# Patient Record
Sex: Male | Born: 1955 | Race: White | Hispanic: No | Marital: Married | Smoking: Never smoker
Health system: Southern US, Community
[De-identification: ages and names within clinical notes are randomized; demographics above are authoritative.]

## PROBLEM LIST (undated history)

## (undated) DIAGNOSIS — E119 Type 2 diabetes mellitus without complications: Secondary | ICD-10-CM

## (undated) DIAGNOSIS — I2510 Atherosclerotic heart disease of native coronary artery without angina pectoris: Secondary | ICD-10-CM

## (undated) DIAGNOSIS — I10 Essential (primary) hypertension: Secondary | ICD-10-CM

## (undated) HISTORY — PX: CARDIAC CATHETERIZATION: SHX172

## (undated) HISTORY — PX: EYE SURGERY: SHX253

---

## 2003-02-08 ENCOUNTER — Encounter: Payer: Self-pay | Admitting: Emergency Medicine

## 2003-02-08 ENCOUNTER — Emergency Department (HOSPITAL_COMMUNITY): Admission: EM | Admit: 2003-02-08 | Discharge: 2003-02-08 | Payer: Self-pay | Admitting: Emergency Medicine

## 2003-04-11 ENCOUNTER — Ambulatory Visit (HOSPITAL_COMMUNITY): Admission: RE | Admit: 2003-04-11 | Discharge: 2003-04-12 | Payer: Self-pay | Admitting: Cardiology

## 2003-04-11 HISTORY — PX: PERCUTANEOUS CORONARY STENT INTERVENTION (PCI-S): SHX6016

## 2004-07-24 ENCOUNTER — Emergency Department (HOSPITAL_COMMUNITY): Admission: EM | Admit: 2004-07-24 | Discharge: 2004-07-24 | Payer: Self-pay | Admitting: Emergency Medicine

## 2004-07-25 ENCOUNTER — Emergency Department (HOSPITAL_COMMUNITY): Admission: EM | Admit: 2004-07-25 | Discharge: 2004-07-25 | Payer: Self-pay | Admitting: Family Medicine

## 2007-08-12 ENCOUNTER — Ambulatory Visit (HOSPITAL_BASED_OUTPATIENT_CLINIC_OR_DEPARTMENT_OTHER): Admission: RE | Admit: 2007-08-12 | Discharge: 2007-08-12 | Payer: Self-pay | Admitting: Orthopedic Surgery

## 2011-05-13 NOTE — Op Note (Signed)
[name], [name]            ACCOUNT NO.:  [id number]   MEDICAL RECORD NO.:  [id number]          PATIENT TYPE:  AMB   LOCATION:  DSC                          FACILITY:  [facility]   PHYSICIAN:  Katy Fitch. Sypher, M.D. DATE OF BIRTH:  26-Nov-1956   DATE OF PROCEDURE:  08/12/2007  DATE OF DISCHARGE:                               OPERATIVE REPORT   PREOPERATIVE DIAGNOSIS:  Chronic pain, left shoulder with evidence of  acromioclavicular arthropathy, biceps tendinopathy and stage II  impingement.   POSTOPERATIVE DIAGNOSIS:  Chronic pain, left shoulder with evidence of  acromioclavicular arthropathy, biceps tendinopathy and stage II  impingement, identification of a degenerative SLAP lesion and a  significant acromioclavicular arthropathy and chronic stage II  impingement due to unfavorable acromial and acromioclavicular anatomy.   OPERATION:  1. Examination of left shoulder under anesthesia.  2. Diagnostic arthroscopy left glenohumeral joint with arthroscopic      debridement of labrum, biceps tendon and careful examination of      superior labrum, revealing a stable biceps origin.  3. Arthroscopic subacromial decompression with bursectomy,      coracoacromial ligament relaxation and acromioplasty.  4. Arthroscopic and open resection of distal clavicle.   OPERATING SURGEON:  Josephine Igo, M.D.   ASSISTANT:  Molly Maduro Dasnoit PA-C.   ANESTHESIA:  General endotracheal supplemented by left interscalene  block, supervising anesthesiologist Dr. Isidor Holts.   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  He works primarily  in Pharmacist, hospital.  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  He remains quite active.  He is a  very large muscular and mildly obese gentleman.   Clinical examination of  his shoulder reveals that he had evidence of AC  arthropathy and impingement.  It was noted during his preoperative MRI  that he had some evidence of bicipital tendinopathy, some rotator cuff  tendinopathy and unfavorable acromioclavicular and acromial anatomy.  Due to failure to respond to nonoperative measures for the past several  months, he is brought to the operating at this time anticipating  diagnostic arthroscopy of the shoulder, management of biceps as  necessary, subacromial decompression and distal clavicle resection.   During his preoperative evaluation, he was noted to have hyperglycemia.  He has had symptoms of nocturia.  We have informed Dr. Recardo Evangelist office  both by phone and faxed message that type 2 diabetes has been identified  with Jose Myers.   After informed consent he is brought to the operating room at this time.   PROCEDURE:  Jose Myers is brought to the operating room and placed  in supine position on the operating table.   Following the induction of general anesthesia under Dr. Thornton Dales  strict supervision, he was carefully positioned in the beach-chair  position with aid of a torso and head holder designed for shoulder  arthroscopy.  Left upper extremity forequarter prepped with DuraPrep and  draped with impervious arthroscopy drapes.   Examination the shoulder  under anesthesia revealed mild adhesive  capsulitis with limitation of elevation and 165 degrees in forward  flexion and combined abduction.  He had very mild limitation of external  rotation at 85 degrees, had 90 degrees abduction with internal rotation  of 75 degrees.   There was no sign of glenohumeral instability.   The arthroscope was introduced through a standard posterior portal with  distension the shoulder joint with 20 mL of sterile saline brought in  through an anterior spinal needle approach.  Diagnostic arthroscopy  revealed mild adhesive capsulitis granulation tissue.   The long head  biceps had a frayed the origin with a type 1 SLAP lesion with fragments  hanging within the joint.  After documentation of the degenerative  labrum a 4.5-mm suction shaver was brought in anteriorly to debride the  labrum and biceps.  The adhesive capsulitis tissues were likewise  debrided.   A nerve hook was used to palpate the origin of the biceps.  It was noted  to be quite stable to superior labrum.  The rotator interval was  inspected.  The biceps tendon was noted to be intact through the  intertubercular groove.  The subscapularis was normal in appearance.  The deep surface supraspinatus, infraspinatus, teres minor noted to be  normal.  The inferior recess was normal.   After completion of the intra-articular debridement, the scope was  removed from the glenohumeral joint and placed in subacromial space.  Adhesive bursitis was noted.  After extensive bursectomy the anatomy,  the coracoacromial arch was studied.  The coracoacromial ligament was  prominent and there was an anteromedial osteophyte noted.  The  coracoacromial ligament relaxed with the cutting cautery followed by  leveling the acromion to a type 1 morphology.  The medial osteophyte  adjacent to the [facility] joint was removed.  Given Jose Myers very  muscular supraspinatus and difficulty with visualization due to adhesive  bursitis, we elected proceed with open resection of distal clavicle  after arthroscopic release of the capsule with cutting cautery.  A 2.5  cm incision was fashioned directly over the distal clavicle.  The  clavicle was exposed subperiosteally and after placement of baby Bennett  retractors.  The distal 15 mm clavicle was removed the oscillating saw.  A rongeur was used to smooth the margins.  Hemostasis achieved.  The  dead space created distal clavicle resection was repaired by figure-of-  eight suture of #2 FiberWire x3.  There was repaired with subdermal  suture of 0 Vicryl and  intradermal 3-0 Prolene with Steri-Strips.  The  scope was placed subacromial space and final debridement, hemostasis and  irrigation was accomplished.   There no apparent complications.  Our final diagnosis was AC arthropathy  and stage II impingement with unfavorable acromial anatomy, also mild  adhesive capsulitis possibly related type 2 diabetes and chronic stage  II impingement.  The biceps origin was degenerative but otherwise  stable.  There is no evidence of impending biceps rupture.   Jose Myers tolerated surgery and anesthesia well.  He was awakened  from general anesthesia and transferred to recovery room with stable  vital signs.   He will be discharged home to the care of his family and will be asked  to return to our office for follow-up in 24 hours for dressing change  and initiation of simple excise program.      Katy Fitch. Sypher, M.D.  Electronically Signed     RVS/MEDQ  D:  08/12/2007  T:  08/13/2007  Job:  [id number]   cc:   Chales Salmon. Abigail Miyamoto, M.D.

## 2011-05-13 NOTE — Consult Note (Signed)
NAMECANDEN, Jose Myers            ACCOUNT NO.:  [id number]   MEDICAL RECORD NO.:  [id number]          PATIENT TYPE:  AMB   LOCATION:  DSC                          FACILITY:  [facility]   PHYSICIAN:  Katy Fitch. Sypher, M.D. DATE OF BIRTH:  Dec 28, 1956   DATE OF CONSULTATION:  08/12/2007  DATE OF DISCHARGE:                                 CONSULTATION   This is a 55 year old right-handed gentleman referred for evaluation and  management of a chronically painful left shoulder.  Our orthopedic  evaluation revealed that he had background medical problems including  dyslipidemia and coronary artery disease.  He is moderately obese with a  weight of 230 pounds and height of 6 feet.  He was noted on his  preoperative lab studies for elective shoulder arthroscopy to have a  random blood glucose of 300 measured at 9:00 a.m. in the morning after  breakfast and had a fasting blood glucose measured on the morning of  08/12/2007 with an IV of Ringer's lactate of 271.  He does report that  he had significant nocturia and I informed [name] and his wife  that he apparently has developed type 2 diabetes.   I contacted Dr. Recardo Evangelist office and reported his height, weight and  fasting blood glucose this morning and reported that I would send this  fax in follow-up so that they would have this documented.  I have  advised [name] to contact Dr. Recardo Evangelist office this afternoon  and make arrangements for a consult either late this week or the part of  next week.  He is advised to back off on his diet to a significantly  lower calorie intake and to avoid refined sugars until further  evaluation of his diabetes is accomplished.      Katy Fitch Sypher, M.D.  Electronically Signed     RVS/MEDQ  D:  08/12/2007  T:  08/13/2007  Job:  [id number]   cc:   Chales Salmon. Abigail Miyamoto, M.D.

## 2011-05-16 NOTE — Discharge Summary (Signed)
Jose Myers, Jose Myers                        ACCOUNT NO.:  [id number]   MEDICAL RECORD NO.:  [id number]                   PATIENT TYPE:  OIB   LOCATION:  6533                                 FACILITY:  [facility]   PHYSICIAN:  Francisca December, M.D.               DATE OF BIRTH:  [birth year]   DATE OF ADMISSION:  04/11/2003  DATE OF DISCHARGE:  04/12/2003                                 DISCHARGE SUMMARY   ADMISSION DIAGNOSES:  1. Recurrent near syncope.  2. Abnormal exercise Cardiolite revealing anterolateral ischemia.  3. Outpatient coronary angiography revealing 70% mid left anterior     descending lesion.  4. Hypotension.  5. Moderate obesity.  6. Unknown lipid status.  7. History of panic attacks.   DISCHARGE DIAGNOSES:  1. Near syncope, has had no episodes in the hospital - unclear as to whether     intervention to the left anterior descending will improve these symptoms.  2. Abnormal Cardiolite, status post cardiac catheterization with     intervention to the left anterior descending by Dr. Amil Amen.  3. Lipid status unknown - we will treat empirically with low dose statin     therapy.  This will need to be followed up as an outpatient.  4. Hypotension.  5. Moderate obesity.  6. History of panic attacks.   HISTORY OF PRESENT ILLNESS:  The patient is a 55 year old gentleman who  recently underwent diagnostic cardiac catheterization after an abnormal  Cardiolite suggested anterolateral ischemia.  This did reveal a 70% mid LAD  and 70% distal RCA stenosis.  He has not had any typical anginal symptoms.  He did have one episode of chest tightness in association with what sounds  like a panic attack and this led to hospital evaluation and subsequent  Cardiolite study which was abnormal as mentioned above.   Based on findings of diagnostic cardiac catheterization and abnormal  Cardiolite, Dr. Amil Amen has recommended undergoing cardiac catheterization  with intervention.  The  patient agrees with this approach.   PROCEDURE:  Cardiac catheterization 04/11/2003 by Dr. Amil Amen.   COMPLICATIONS:  None.   CONSULTATIONS:  None.   COURSE IN THE HOSPITAL:  The patient was admitted to [facility] on  04/11/2003 for elective intervention to the LAD lesion.  Preprocedure  laboratory studies showed a BUN of 17, creatinine 1.0, and potassium 4.8.  CBC within normal limits.  INR 1.07.  The patient was taken to the cardiac  cath lab by Dr. Amil Amen.  He proceeded with successful stenting to the 70%  lesion of the mid LAD.  Excellent angiographic appearance with 0% residual  stenosis.  TIMI-3 flow was maintained.  This was an ACC/AHA type A low risk  lesion for intervention.  Stent placed was a TAXUS Express 2 Monorail 3.5 x  16 mm.  Perclose was used at the end of the procedure.  The patient received  appropriate antibiotic coverage with  a dose of Ancef 1 g in the cath lab and  1 g in the morning of 04/12/2003.   On 04/12/2003 the patient remained cardiographically stable.  Groin stable.  He was felt stable for discharge to home.   DISCHARGE MEDICATIONS:  1. Enteric-coated aspirin 325 mg.  2. Plavix 75 mg a day for 3 months.  3. Isoptin SR 240 mg.  4. Ativan 0.5 mg a day as needed.  5. Nitroglycerin 0.4 mg one under the tongue as needed for chest pain.  6. Lipitor 10 mg one p.o. at bedtime.  7. Tylenol as needed as directed on the bottle.   ACTIVITY INSTRUCTIONS:  No lifting more than 5 pounds or driving for 2 days.   DIET:  Low fat, low cholesterol, low salt diet.   WOUND CARE:  May shower.  Do not soak in a tub for a week.  Needs to keep  his Perclose bandage on for the next 24 hours and then may remove.  He is to  call the office with any problems or questions.   FOLLOW UP:  He will be seen by one of the physician extenders on 04/27/2003  at 10 o'clock in the morning.  Dr. Amil Amen will be in the office at that  time.   The patient should also follow  up with Dr. Abigail Miyamoto.  He needs aggressive  risk factor modification.  This would include diet, exercise, weight loss,  blood pressure control and goal LDL less than 70-100.     Georgiann Cocker Jernejcic, P.A.                   Francisca December, M.D.    TCJ/MEDQ  D:  04/12/2003  T:  04/12/2003  Job:  [id number]   cc:   Chales Salmon. Abigail Miyamoto, M.D.  [address]  [place]  [place] [address]  Fax: [phone]

## 2011-10-13 LAB — BASIC METABOLIC PANEL
BUN: 14
CO2: 27
Calcium: 9.3
Chloride: 100
Creatinine, Ser: 1.05
GFR calc Af Amer: >60
GFR calc non Af Amer: >60
Glucose, Bld: 300 — ABNORMAL HIGH
Potassium: 4
Sodium: 135

## 2011-10-13 LAB — POCT HEMOGLOBIN-HEMACUE
Hemoglobin: 17.3 — ABNORMAL HIGH
Operator id: 123621

## 2016-12-18 ENCOUNTER — Encounter: Payer: Self-pay | Admitting: Internal Medicine

## 2017-03-26 ENCOUNTER — Ambulatory Visit: Payer: 59 | Admitting: Dietician

## 2017-04-20 ENCOUNTER — Encounter: Payer: Self-pay | Admitting: Registered"

## 2017-04-20 ENCOUNTER — Encounter: Payer: 59 | Attending: Family Medicine | Admitting: Registered"

## 2017-04-20 NOTE — Patient Instructions (Addendum)
Plan:  Aim for 3-4 Carb Choices per meal (45-60 grams) +/- 1 either way  Aim for 0-2 Carbs per snack if hungry  Include protein in moderation with your meals and snacks Consider reading food labels for Total Carbohydrate  Continue your activity level as tolerated Consider checking BG at alternate times during the day several times per week, consider keeping track in a log  Consider taking medication as directed by MD Consider checking your feet daily.

## 2017-04-20 NOTE — Progress Notes (Signed)
Diabetes Self-Management Education  Visit Type: First/Initial  Appt. Start Time: 1400 Appt. End Time: 1520  04/21/2017  Mr. Jose Myers, identified by name and date of birth, is a 61 y.o. male with a diagnosis of Diabetes: Type 2.   ASSESSMENT Patient states he has a family history of DM. Pt reports he "got rid of it" in 1995. Pt states he has made some lifestyle changes and has decreased his fasting BG and is scheduled to have A1C checked again in Aug. Pt also states he is in the process of losing weight with exercise and cutting back on concentrated sweets.   Pt states he has had some back pain which he has been taking prednisone.  Pt uses several OTC supplements to lose weight (apple cider vinegar), treat DM (Vanadyl Sulfate, chromium picolate) and a Australian cream for neuropathy.    Height  (1.778 m), weight 206 lb 6.4 oz (93.6 kg). Body mass index is 29.62 kg/m.      Diabetes Self-Management Education - 04/20/17 1412      Visit Information   Visit Type First/Initial     Initial Visit   Diabetes Type Type 2   Are you currently following a meal plan? No   Are you taking your medications as prescribed? Yes  sometimes forgets afternoon Metform (takes 3x day)   Date Diagnosed 3 years     Health Coping   How would you rate your overall health? Good     Psychosocial Assessment   Patient Belief/Attitude about Diabetes Afraid  fears amputation and loss of eye sight     Complications   Last HgB A1C per patient/outside source 9.3 %  10/2016   How often do you check your blood sugar? --  1x every 2 weeks, fasting   Fasting Blood glucose range (mg/dL) --  161-096   Number of hypoglycemic episodes per month 0   Have you had a dilated eye exam in the past 12 months? Yes   Have you had a dental exam in the past 12 months? Yes   Are you checking your feet? Yes   How many days per week are you checking your feet? 1     Dietary Intake   Breakfast none OR bagel OR  oatmeal, coffee   eggs ~30 min after oatmeal   Snack (morning) none   Lunch subway ham, baked chips, drink OR other fast food   Snack (afternoon) none   Dinner soup OR sandwich, OR left overs  big family dinners on Sundays   Snack (evening) cookies OR peppermint patties OR fruit   Beverage(s) 1/2 sweet tea, diet dr. Reino Kent, coffee, a lot of water,      Exercise   Exercise Type Light (walking / raking leaves)   How many days per week to you exercise? 3   How many minutes per day do you exercise? 60   Total minutes per week of exercise 180     Patient Education   Previous Diabetes Education No   Disease state  Definition of diabetes, type 1 and 2, and the diagnosis of diabetes   Nutrition management  Role of diet in the treatment of diabetes and the relationship between the three main macronutrients and blood glucose level;Food label reading, portion sizes and measuring food.;Carbohydrate counting   Physical activity and exercise  Role of exercise on diabetes management, blood pressure control and cardiac health.     Individualized Goals (developed by patient)   Nutrition Follow meal  plan discussed   Physical Activity Exercise 5-7 days per week   Medications take my medication as prescribed     Outcomes   Expected Outcomes Demonstrated interest in learning. Expect positive outcomes   Future DMSE 4-6 wks   Program Status Not Completed      Individualized Plan for Diabetes Self-Management Training:   Learning Objective:  Patient will have a greater understanding of diabetes self-management. Patient education plan is to attend individual and/or group sessions per assessed needs and concerns.   Plan:   Patient Instructions  Plan:  Aim for 3-4 Carb Choices per meal (45-60 grams) +/- 1 either way  Aim for 0-2 Carbs per snack if hungry  Include protein in moderation with your meals and snacks Consider reading food labels for Total Carbohydrate  Continue your activity level as  tolerated Consider checking BG at alternate times during the day several times per week, consider keeping track in a log  Consider taking medication as directed by MD Consider checking your feet daily.   Expected Outcomes:  Demonstrated interest in learning. Expect positive outcomes  Education material provided: Living Well with Diabetes, Food label handouts, A1C conversion sheet, My Plate and Snack sheet  If problems or questions, patient to contact team via:  Phone  Future DSME appointment: 4-6 wks

## 2017-05-18 ENCOUNTER — Ambulatory Visit: Payer: 59 | Admitting: Registered"

## 2018-03-08 DIAGNOSIS — M25562 Pain in left knee: Secondary | ICD-10-CM | POA: Insufficient documentation

## 2018-03-25 ENCOUNTER — Ambulatory Visit (INDEPENDENT_AMBULATORY_CARE_PROVIDER_SITE_OTHER): Payer: BLUE CROSS/BLUE SHIELD

## 2018-03-25 ENCOUNTER — Ambulatory Visit: Payer: BLUE CROSS/BLUE SHIELD | Admitting: Podiatry

## 2018-03-25 ENCOUNTER — Encounter: Payer: Self-pay | Admitting: Podiatry

## 2018-03-25 VITALS — BP 188/110 | HR 95 | Resp 16

## 2018-03-25 DIAGNOSIS — M722 Plantar fascial fibromatosis: Secondary | ICD-10-CM

## 2018-03-25 MED ORDER — TRIAMCINOLONE ACETONIDE 10 MG/ML IJ SUSP
10.0000 mg | Freq: Once | INTRAMUSCULAR | Status: AC
  Administered 2018-03-25: 10 mg

## 2018-03-25 NOTE — Progress Notes (Signed)
Subjective:   Patient ID: Jose Myers, male   DOB: 62 y.o.   MRN: [id number]   HPI Patient presents with chronic pain across the bottom of the arches and states is been going on for around 5 years.  Patient tries tremendous stretching exercises and ice therapy with only partial resolution of his symptoms.  Patient does not smoke and likes to be active   Review of Systems  All other systems reviewed and are negative.       Objective:  Physical Exam  Constitutional: He appears well-developed and well-nourished.  Cardiovascular: Intact distal pulses.  Pulmonary/Chest: Effort normal.  Musculoskeletal: Normal range of motion.  Neurological: He is alert.  Skin: Skin is warm.  Nursing note and vitals reviewed.   Neurovascular status found to be intact muscle strength is adequate range of motion within normal limits.  Patient is noted to have exquisite discomfort in the plantar fascia distal to its insertion into the calcaneus bilateral with fluid buildup noted around this area.  Patient also has very tight plantar fascial bilateral and incurvated nailbeds 1-5 both feet.  Patient has good digital perfusion well oriented x3     Assessment:  Chronic plantar fasciitis of the mid arch bilateral with foot structure is complicating factor     Plan:  H&P x-rays reviewed and injected the fascia bilateral 3 mg Kenalog 5 mg Xylocaine and instructed on fascial braces to lift the arch is to take pressure off the area.  Debrided his nails and he will reappoint again in 2 weeks to recheck  X-rays indicate there is a high arch foot structure with no other significant pathology noted

## 2018-03-25 NOTE — Patient Instructions (Signed)

## 2018-04-15 ENCOUNTER — Ambulatory Visit: Payer: BLUE CROSS/BLUE SHIELD | Admitting: Podiatry

## 2018-04-22 ENCOUNTER — Encounter: Payer: Self-pay | Admitting: Podiatry

## 2018-04-22 ENCOUNTER — Ambulatory Visit: Payer: BLUE CROSS/BLUE SHIELD | Admitting: Podiatry

## 2018-04-22 DIAGNOSIS — M722 Plantar fascial fibromatosis: Secondary | ICD-10-CM | POA: Diagnosis not present

## 2018-04-22 NOTE — Progress Notes (Signed)
Subjective:   Patient ID: Jose Myers, male   DOB: 62 y.o.   MRN: [id number]   HPI Patient presents with chronic fascial symptomatology that has improved dramatically with treatment of last week   ROS      Objective:  Physical Exam  Neurovascular status intact with inflammation pain around the medial band of the plantar fascia that improved quite a bit from previous visit     Assessment:  Fasciitis improved bilateral     Plan:  With 5-year history I have recommended long-term orthotics and today I did scan for customized orthotics and he will be seen back when those are ready.  Gave instructions for continued physical therapy

## 2018-08-15 ENCOUNTER — Emergency Department (HOSPITAL_COMMUNITY): Payer: BLUE CROSS/BLUE SHIELD

## 2018-08-15 ENCOUNTER — Encounter (HOSPITAL_COMMUNITY): Payer: Self-pay | Admitting: Emergency Medicine

## 2018-08-15 ENCOUNTER — Inpatient Hospital Stay (HOSPITAL_COMMUNITY)
Admission: EM | Admit: 2018-08-15 | Discharge: 2018-09-06 | DRG: 222 | Disposition: A | Discharge status: Home / Self Care | Payer: BLUE CROSS/BLUE SHIELD | Attending: Internal Medicine | Admitting: Internal Medicine

## 2018-08-15 DIAGNOSIS — R339 Retention of urine, unspecified: Secondary | ICD-10-CM | POA: Diagnosis not present

## 2018-08-15 DIAGNOSIS — Z955 Presence of coronary angioplasty implant and graft: Secondary | ICD-10-CM

## 2018-08-15 DIAGNOSIS — I469 Cardiac arrest, cause unspecified: Secondary | ICD-10-CM

## 2018-08-15 DIAGNOSIS — J9690 Respiratory failure, unspecified, unspecified whether with hypoxia or hypercapnia: Secondary | ICD-10-CM

## 2018-08-15 DIAGNOSIS — I236 Thrombosis of atrium, auricular appendage, and ventricle as current complications following acute myocardial infarction: Secondary | ICD-10-CM

## 2018-08-15 DIAGNOSIS — I255 Ischemic cardiomyopathy: Secondary | ICD-10-CM | POA: Diagnosis present

## 2018-08-15 DIAGNOSIS — N179 Acute kidney failure, unspecified: Secondary | ICD-10-CM | POA: Diagnosis present

## 2018-08-15 DIAGNOSIS — R109 Unspecified abdominal pain: Secondary | ICD-10-CM | POA: Diagnosis present

## 2018-08-15 DIAGNOSIS — I5021 Acute systolic (congestive) heart failure: Secondary | ICD-10-CM | POA: Diagnosis not present

## 2018-08-15 DIAGNOSIS — I25110 Atherosclerotic heart disease of native coronary artery with unstable angina pectoris: Secondary | ICD-10-CM | POA: Diagnosis present

## 2018-08-15 DIAGNOSIS — I493 Ventricular premature depolarization: Secondary | ICD-10-CM | POA: Diagnosis not present

## 2018-08-15 DIAGNOSIS — E1121 Type 2 diabetes mellitus with diabetic nephropathy: Secondary | ICD-10-CM | POA: Diagnosis not present

## 2018-08-15 DIAGNOSIS — E876 Hypokalemia: Secondary | ICD-10-CM | POA: Diagnosis not present

## 2018-08-15 DIAGNOSIS — F05 Delirium due to known physiological condition: Secondary | ICD-10-CM | POA: Diagnosis not present

## 2018-08-15 DIAGNOSIS — E872 Acidosis: Secondary | ICD-10-CM | POA: Diagnosis not present

## 2018-08-15 DIAGNOSIS — J9601 Acute respiratory failure with hypoxia: Secondary | ICD-10-CM | POA: Diagnosis not present

## 2018-08-15 DIAGNOSIS — R748 Abnormal levels of other serum enzymes: Secondary | ICD-10-CM | POA: Diagnosis not present

## 2018-08-15 DIAGNOSIS — N170 Acute kidney failure with tubular necrosis: Secondary | ICD-10-CM | POA: Diagnosis not present

## 2018-08-15 DIAGNOSIS — E782 Mixed hyperlipidemia: Secondary | ICD-10-CM | POA: Diagnosis present

## 2018-08-15 DIAGNOSIS — D649 Anemia, unspecified: Secondary | ICD-10-CM | POA: Diagnosis not present

## 2018-08-15 DIAGNOSIS — I462 Cardiac arrest due to underlying cardiac condition: Secondary | ICD-10-CM | POA: Diagnosis not present

## 2018-08-15 DIAGNOSIS — I509 Heart failure, unspecified: Secondary | ICD-10-CM

## 2018-08-15 DIAGNOSIS — Z23 Encounter for immunization: Secondary | ICD-10-CM | POA: Diagnosis not present

## 2018-08-15 DIAGNOSIS — R410 Disorientation, unspecified: Secondary | ICD-10-CM

## 2018-08-15 DIAGNOSIS — N141 Nephropathy induced by other drugs, medicaments and biological substances: Secondary | ICD-10-CM | POA: Diagnosis not present

## 2018-08-15 DIAGNOSIS — I2510 Atherosclerotic heart disease of native coronary artery without angina pectoris: Secondary | ICD-10-CM | POA: Diagnosis not present

## 2018-08-15 DIAGNOSIS — R778 Other specified abnormalities of plasma proteins: Secondary | ICD-10-CM | POA: Diagnosis present

## 2018-08-15 DIAGNOSIS — T508X5A Adverse effect of diagnostic agents, initial encounter: Secondary | ICD-10-CM | POA: Diagnosis not present

## 2018-08-15 DIAGNOSIS — E8770 Fluid overload, unspecified: Secondary | ICD-10-CM | POA: Diagnosis not present

## 2018-08-15 DIAGNOSIS — I214 Non-ST elevation (NSTEMI) myocardial infarction: Secondary | ICD-10-CM

## 2018-08-15 DIAGNOSIS — Z833 Family history of diabetes mellitus: Secondary | ICD-10-CM

## 2018-08-15 DIAGNOSIS — I130 Hypertensive heart and chronic kidney disease with heart failure and stage 1 through stage 4 chronic kidney disease, or unspecified chronic kidney disease: Secondary | ICD-10-CM | POA: Diagnosis present

## 2018-08-15 DIAGNOSIS — N183 Chronic kidney disease, stage 3 (moderate): Secondary | ICD-10-CM | POA: Diagnosis present

## 2018-08-15 DIAGNOSIS — I472 Ventricular tachycardia: Secondary | ICD-10-CM | POA: Diagnosis not present

## 2018-08-15 DIAGNOSIS — J90 Pleural effusion, not elsewhere classified: Secondary | ICD-10-CM

## 2018-08-15 DIAGNOSIS — R809 Proteinuria, unspecified: Secondary | ICD-10-CM

## 2018-08-15 DIAGNOSIS — I513 Intracardiac thrombosis, not elsewhere classified: Secondary | ICD-10-CM | POA: Diagnosis present

## 2018-08-15 DIAGNOSIS — E1129 Type 2 diabetes mellitus with other diabetic kidney complication: Secondary | ICD-10-CM | POA: Diagnosis present

## 2018-08-15 DIAGNOSIS — Z950 Presence of cardiac pacemaker: Secondary | ICD-10-CM

## 2018-08-15 DIAGNOSIS — E1165 Type 2 diabetes mellitus with hyperglycemia: Secondary | ICD-10-CM | POA: Diagnosis not present

## 2018-08-15 DIAGNOSIS — I5043 Acute on chronic combined systolic (congestive) and diastolic (congestive) heart failure: Secondary | ICD-10-CM | POA: Diagnosis present

## 2018-08-15 DIAGNOSIS — J918 Pleural effusion in other conditions classified elsewhere: Secondary | ICD-10-CM | POA: Diagnosis present

## 2018-08-15 DIAGNOSIS — R7989 Other specified abnormal findings of blood chemistry: Secondary | ICD-10-CM | POA: Diagnosis present

## 2018-08-15 DIAGNOSIS — R0789 Other chest pain: Secondary | ICD-10-CM

## 2018-08-15 DIAGNOSIS — I272 Pulmonary hypertension, unspecified: Secondary | ICD-10-CM | POA: Diagnosis present

## 2018-08-15 DIAGNOSIS — D72829 Elevated white blood cell count, unspecified: Secondary | ICD-10-CM | POA: Diagnosis not present

## 2018-08-15 DIAGNOSIS — I361 Nonrheumatic tricuspid (valve) insufficiency: Secondary | ICD-10-CM | POA: Diagnosis not present

## 2018-08-15 DIAGNOSIS — Z6830 Body mass index (BMI) 30.0-30.9, adult: Secondary | ICD-10-CM

## 2018-08-15 DIAGNOSIS — I639 Cerebral infarction, unspecified: Secondary | ICD-10-CM | POA: Diagnosis not present

## 2018-08-15 DIAGNOSIS — I252 Old myocardial infarction: Secondary | ICD-10-CM

## 2018-08-15 DIAGNOSIS — I340 Nonrheumatic mitral (valve) insufficiency: Secondary | ICD-10-CM | POA: Diagnosis present

## 2018-08-15 DIAGNOSIS — R0602 Shortness of breath: Secondary | ICD-10-CM

## 2018-08-15 DIAGNOSIS — Z7984 Long term (current) use of oral hypoglycemic drugs: Secondary | ICD-10-CM

## 2018-08-15 DIAGNOSIS — R1084 Generalized abdominal pain: Secondary | ICD-10-CM

## 2018-08-15 DIAGNOSIS — Z79899 Other long term (current) drug therapy: Secondary | ICD-10-CM

## 2018-08-15 DIAGNOSIS — I5041 Acute combined systolic (congestive) and diastolic (congestive) heart failure: Secondary | ICD-10-CM | POA: Diagnosis not present

## 2018-08-15 DIAGNOSIS — G9340 Encephalopathy, unspecified: Secondary | ICD-10-CM | POA: Diagnosis not present

## 2018-08-15 DIAGNOSIS — I5023 Acute on chronic systolic (congestive) heart failure: Secondary | ICD-10-CM | POA: Diagnosis not present

## 2018-08-15 DIAGNOSIS — E1122 Type 2 diabetes mellitus with diabetic chronic kidney disease: Secondary | ICD-10-CM | POA: Diagnosis present

## 2018-08-15 DIAGNOSIS — R0781 Pleurodynia: Secondary | ICD-10-CM

## 2018-08-15 DIAGNOSIS — E669 Obesity, unspecified: Secondary | ICD-10-CM | POA: Diagnosis present

## 2018-08-15 DIAGNOSIS — I25119 Atherosclerotic heart disease of native coronary artery with unspecified angina pectoris: Secondary | ICD-10-CM | POA: Diagnosis not present

## 2018-08-15 HISTORY — DX: Type 2 diabetes mellitus without complications: E11.9

## 2018-08-15 HISTORY — DX: Atherosclerotic heart disease of native coronary artery without angina pectoris: I25.10

## 2018-08-15 HISTORY — DX: Essential (primary) hypertension: I10

## 2018-08-15 LAB — URINALYSIS, ROUTINE W REFLEX MICROSCOPIC
BILIRUBIN URINE: NEGATIVE
Bacteria, UA: NONE SEEN
GLUCOSE, UA: NEGATIVE mg/dL
HGB URINE DIPSTICK: NEGATIVE
KETONES UR: NEGATIVE mg/dL
LEUKOCYTES UA: NEGATIVE
Nitrite: NEGATIVE
PH: 5 (ref 5.0–8.0)
Protein, ur: 100 mg/dL — AB
Specific Gravity, Urine: 1.018 (ref 1.005–1.030)

## 2018-08-15 LAB — COMPREHENSIVE METABOLIC PANEL
ALK PHOS: 44 U/L (ref 38–126)
ALT: 21 U/L (ref 0–44)
ANION GAP: 12 (ref 5–15)
AST: 25 U/L (ref 15–41)
Albumin: 3.2 g/dL — ABNORMAL LOW (ref 3.5–5.0)
BUN: 35 mg/dL — ABNORMAL HIGH (ref 8–23)
CALCIUM: 8.4 mg/dL — AB (ref 8.9–10.3)
CHLORIDE: 98 mmol/L (ref 98–111)
CO2: 25 mmol/L (ref 22–32)
Creatinine, Ser: 2.59 mg/dL — ABNORMAL HIGH (ref 0.61–1.24)
GFR, EST AFRICAN AMERICAN: 28 mL/min — AB (ref >60)
GFR, EST NON AFRICAN AMERICAN: 25 mL/min — AB (ref >60)
Glucose, Bld: 205 mg/dL — ABNORMAL HIGH (ref 70–99)
Potassium: 4 mmol/L (ref 3.5–5.1)
SODIUM: 135 mmol/L (ref 135–145)
Total Bilirubin: 1.2 mg/dL (ref 0.3–1.2)
Total Protein: 6.1 g/dL — ABNORMAL LOW (ref 6.5–8.1)

## 2018-08-15 LAB — CBC
HCT: 40.2 % (ref 39.0–52.0)
HEMOGLOBIN: 13.5 g/dL (ref 13.0–17.0)
MCH: 28.8 pg (ref 26.0–34.0)
MCHC: 33.6 g/dL (ref 30.0–36.0)
MCV: 85.7 fL (ref 78.0–100.0)
Platelets: 198 10*3/uL (ref 150–400)
RBC: 4.69 MIL/uL (ref 4.22–5.81)
RDW: 13.9 % (ref 11.5–15.5)
WBC: 9.1 10*3/uL (ref 4.0–10.5)

## 2018-08-15 LAB — LIPASE, BLOOD: LIPASE: 31 U/L (ref 11–51)

## 2018-08-15 LAB — I-STAT TROPONIN, ED: TROPONIN I, POC: 0.88 ng/mL — AB (ref 0.00–0.08)

## 2018-08-15 LAB — TROPONIN I: Troponin I: 1.86 ng/mL (ref <0.03)

## 2018-08-15 LAB — MAGNESIUM: MAGNESIUM: 2.4 mg/dL (ref 1.7–2.4)

## 2018-08-15 LAB — BRAIN NATRIURETIC PEPTIDE: B Natriuretic Peptide: 930.7 pg/mL — ABNORMAL HIGH (ref 0.0–100.0)

## 2018-08-15 LAB — TSH: TSH: 0.907 u[IU]/mL (ref 0.350–4.500)

## 2018-08-15 MED ORDER — CLOMIPHENE CITRATE 50 MG PO TABS
25.0000 mg | ORAL_TABLET | Freq: Every day | ORAL | Status: DC
  Administered 2018-08-16 – 2018-08-20 (×5): 25 mg via ORAL
  Filled 2018-08-15 (×5): qty 1

## 2018-08-15 MED ORDER — FUROSEMIDE 10 MG/ML IJ SOLN
80.0000 mg | Freq: Once | INTRAMUSCULAR | Status: AC
  Administered 2018-08-15: 80 mg via INTRAVENOUS
  Filled 2018-08-15: qty 8

## 2018-08-15 MED ORDER — INSULIN ASPART 100 UNIT/ML ~~LOC~~ SOLN
0.0000 [IU] | SUBCUTANEOUS | Status: DC
  Administered 2018-08-16: 1 [IU] via SUBCUTANEOUS

## 2018-08-15 MED ORDER — OMEGA-3-ACID ETHYL ESTERS 1 G PO CAPS
1.0000 g | ORAL_CAPSULE | Freq: Every day | ORAL | Status: DC
  Administered 2018-08-15 – 2018-09-05 (×13): 1 g via ORAL
  Filled 2018-08-15 (×17): qty 1

## 2018-08-15 MED ORDER — HEPARIN (PORCINE) IN NACL 100-0.45 UNIT/ML-% IJ SOLN
1500.0000 [IU]/h | INTRAMUSCULAR | Status: DC
  Administered 2018-08-15: 1050 [IU]/h via INTRAVENOUS
  Administered 2018-08-16 (×2): 1200 [IU]/h via INTRAVENOUS
  Administered 2018-08-17 – 2018-08-19 (×3): 1500 [IU]/h via INTRAVENOUS
  Filled 2018-08-15 (×6): qty 250

## 2018-08-15 MED ORDER — CARVEDILOL 12.5 MG PO TABS
6.2500 mg | ORAL_TABLET | Freq: Every day | ORAL | Status: DC
  Administered 2018-08-16: 6.25 mg via ORAL
  Filled 2018-08-15: qty 1

## 2018-08-15 MED ORDER — LORAZEPAM 0.5 MG PO TABS
0.5000 mg | ORAL_TABLET | Freq: Every day | ORAL | Status: DC | PRN
  Administered 2018-08-15 – 2018-08-20 (×5): 0.5 mg via ORAL
  Filled 2018-08-15 (×5): qty 1

## 2018-08-15 MED ORDER — ACETAMINOPHEN 325 MG PO TABS
650.0000 mg | ORAL_TABLET | Freq: Four times a day (QID) | ORAL | Status: DC | PRN
  Administered 2018-08-16 – 2018-08-25 (×3): 650 mg via ORAL
  Filled 2018-08-15 (×2): qty 2

## 2018-08-15 MED ORDER — HEPARIN BOLUS VIA INFUSION
4000.0000 [IU] | Freq: Once | INTRAVENOUS | Status: AC
  Administered 2018-08-15: 4000 [IU] via INTRAVENOUS
  Filled 2018-08-15: qty 4000

## 2018-08-15 MED ORDER — ACETAMINOPHEN 650 MG RE SUPP: 650.0000 mg | Freq: Four times a day (QID) | RECTAL | Status: DC | PRN

## 2018-08-15 MED ORDER — ONDANSETRON HCL 4 MG/2ML IJ SOLN: 4.0000 mg | Freq: Four times a day (QID) | INTRAMUSCULAR | Status: DC | PRN

## 2018-08-15 MED ORDER — ASPIRIN 81 MG PO CHEW
324.0000 mg | CHEWABLE_TABLET | Freq: Once | ORAL | Status: AC
  Administered 2018-08-15: 324 mg via ORAL
  Filled 2018-08-15: qty 4

## 2018-08-15 MED ORDER — TRAZODONE HCL 50 MG PO TABS
50.0000 mg | ORAL_TABLET | Freq: Every evening | ORAL | Status: DC | PRN
Start: 2018-08-15 — End: 2018-09-06
  Administered 2018-08-15 – 2018-09-05 (×12): 50 mg via ORAL
  Filled 2018-08-15 (×13): qty 1

## 2018-08-15 MED ORDER — ONDANSETRON HCL 4 MG PO TABS
4.0000 mg | ORAL_TABLET | Freq: Four times a day (QID) | ORAL | Status: DC | PRN
  Filled 2018-08-15: qty 1

## 2018-08-15 MED ORDER — SERTRALINE HCL 25 MG PO TABS
25.0000 mg | ORAL_TABLET | Freq: Every day | ORAL | Status: DC
  Administered 2018-08-16 – 2018-09-06 (×22): 25 mg via ORAL
  Filled 2018-08-15 (×22): qty 1

## 2018-08-15 MED ORDER — OMEGA 3 340 MG PO CPDR: 1.0000 | DELAYED_RELEASE_CAPSULE | Freq: Every day | ORAL | Status: DC

## 2018-08-15 MED ORDER — VERAPAMIL HCL ER 240 MG PO TBCR
240.0000 mg | EXTENDED_RELEASE_TABLET | Freq: Two times a day (BID) | ORAL | Status: DC
  Filled 2018-08-15 (×2): qty 1

## 2018-08-15 NOTE — Consult Note (Addendum)
CARDIOLOGY CONSULT NOTE   Referring Physician: Dr. Toniann FailKakrakandy Primary Physician: Dr. Tenny Crawoss Primary Cardiologist: None Reason for Consultation: Heart failure   HPI: Jose Myers is a 62 y.o. male w/ history of DM2 who presents with new heart failure.   In brief, the patient has no known history of cardiac disease. He reports approximately 2 weeks of progressive dyspnea and lower extremity edema. He has never had anything like this before. He denies any recent symptoms of fevers, chills, chest pain, chest pressure, palpitations, nausea, vomiting, diarrhea, constipation.  He does report symptoms of worsening orthopnea, PND, and bilateral lower extremity edema.  In the ED, the patient was mildly hypoxemic on room air.  His vital signs were otherwise normal.  A chest x-ray revealed bilateral pulmonary edema and bilateral pleural effusions.  Labs were significant for an elevated BNP to 930, and an elevated troponin to 0.88.  His baseline renal function is unknown, however his creatinine currently is 2.59.  An ECG reveals anteroseptal infarct with scattered 1 mm ST depressions.  The patient was given full dose aspirin, started on a heparin drip, and given 80 mg of IV Lasix.  He is being admitted for a new heart failure work-up.  Review of Systems:     Cardiac Review of Systems: {Y] = yes [ ]  = no  Chest Pain [    ]  Resting SOB [   ] Exertional SOB  [ Y ]  Orthopnea [ Y ]   Pedal Edema [ Y  ]    Palpitations [  ] Syncope  [  ]   Presyncope [   ]  General Review of Systems: [Y] = yes [  ]=no Constitional: recent weight change [ Y ]; anorexia [  ]; fatigue [  ]; nausea [  ]; night sweats [  ]; fever [  ]; or chills [  ];                                                                     Eyes : blurred vision [  ]; diplopia [   ]; vision changes [  ];  Amaurosis fugax[  ]; Resp: cough [  ];  wheezing[  ];  hemoptysis[  ];  PND [  ];  GI:  gallstones[  ], vomiting[  ];  dysphagia[  ]; melena[   ];  hematochezia [  ]; heartburn[  ];   GU: kidney stones [  ]; hematuria[  ];   dysuria [  ];  nocturia[  ]; incontinence [  ];             Skin: rash, swelling[  ];, hair loss[  ];  peripheral edema[ Y ];  or itching[  ]; Musculosketetal: myalgias[  ];  joint swelling[  ];  joint erythema[  ];  joint pain[  ];  back pain[  ];  Heme/Lymph: bruising[  ];  bleeding[  ];  anemia[  ];  Neuro: TIA[  ];  headaches[  ];  stroke[  ];  vertigo[  ];  seizures[  ];   paresthesias[  ];  difficulty walking[  ];  Psych:depression[  ]; anxiety[  ];  Endocrine: diabetes[  ];  thyroid dysfunction[  ];  Other:  Past Medical History:  Diagnosis Date  . Diabetes mellitus without complication (HCC)   . Hypertension     Medications Prior to Admission  Medication Sig Dispense Refill  . Apple Cid Vn-Grn Tea-Bit Or-Cr (APPLE CIDER VINEGAR PLUS) TABS Take 1 tablet by mouth daily.    . carvedilol (COREG) 6.25 MG tablet Take 6.25 mg by mouth daily.  4  . clomiPHENE (CLOMID) 50 MG tablet Take 25 mg by mouth daily.    Marland Kitchen glimepiride (AMARYL) 4 MG tablet Take 4 mg by mouth 2 (two) times daily.    Marland Kitchen LORazepam (ATIVAN) 1 MG tablet Take 0.5-1 mg by mouth daily as needed for anxiety.    Marland Kitchen losartan (COZAAR) 100 MG tablet Take 100 mg by mouth daily.  4  . metFORMIN (GLUCOPHAGE) 500 MG tablet Take 500 mg by mouth 3 (three) times daily.     . Omega 3 340 MG CPDR Take 1 capsule by mouth at bedtime.    . Prenat-FePoly-Fered-FA-Omega 3 (DUET DHA 430 PO) Take 1 capsule by mouth daily.    . sertraline (ZOLOFT) 50 MG tablet Take 25 mg by mouth daily.  4  . traZODone (DESYREL) 50 MG tablet Take 50 mg by mouth at bedtime as needed.  0  . verapamil (VERELAN PM) 240 MG 24 hr capsule Take 240 mg by mouth 2 (two) times daily.        Melene Muller ON 08/16/2018] carvedilol  6.25 mg Oral Daily  . [START ON 08/16/2018] clomiPHENE  25 mg Oral Daily  . insulin aspart  0-9 Units Subcutaneous Q4H  . omega-3 acid ethyl esters  1 g Oral QHS  .  [START ON 08/16/2018] sertraline  25 mg Oral Daily  . verapamil  240 mg Oral BID    Infusions: . heparin 1,050 Units/hr (08/15/18 2056)    No Known Allergies  Social History   Socioeconomic History  . Marital status: Married    Spouse name: Not on file  . Number of children: Not on file  . Years of education: Not on file  . Highest education level: Not on file  Occupational History  . Not on file  Social Needs  . Financial resource strain: Not on file  . Food insecurity:    Worry: Not on file    Inability: Not on file  . Transportation needs:    Medical: Not on file    Non-medical: Not on file  Tobacco Use  . Smoking status: Never Smoker  . Smokeless tobacco: Never Used  Substance and Sexual Activity  . Alcohol use: Yes    Comment: Occasionally.  . Drug use: Not on file  . Sexual activity: Not on file  Lifestyle  . Physical activity:    Days per week: Not on file    Minutes per session: Not on file  . Stress: Not on file  Relationships  . Social connections:    Talks on phone: Not on file    Gets together: Not on file    Attends religious service: Not on file    Active member of club or organization: Not on file    Attends meetings of clubs or organizations: Not on file    Relationship status: Not on file  . Intimate partner violence:    Fear of current or ex partner: Not on file    Emotionally abused: Not on file    Physically abused: Not on file    Forced sexual activity: Not on file  Other Topics Concern  . Not on file  Social History Narrative  . Not on file    Family History  Problem Relation Age of Onset  . Diabetes Mellitus II Mother   . Throat cancer Brother     PHYSICAL EXAM: Vitals:   08/15/18 2115 08/15/18 2143  BP: (!) 135/95 (!) 136/92  Pulse: 88 91  Resp: 20 18  Temp:  97.6 F (36.4 C)  SpO2: 97% 95%     Intake/Output Summary (Last 24 hours) at 08/15/2018 2305 Last data filed at 08/15/2018 2151 Gross per 24 hour  Intake -    Output 800 ml  Net -800 ml    General:  Well appearing. No respiratory difficulty HEENT: normal Neck: supple.  JVP elevated to approximately 12 cm water.  Carotids 2+ bilat; no bruits. No lymphadenopathy or thryomegaly appreciated. Cor: PMI nondisplaced. Regular rate & rhythm.  2/6 systolic murmur heard throughout the precordium.  No rubs, gallops or murmurs. Lungs: Diffuse crackles bilaterally with decreased breath sounds at the bases Abdomen: soft, nontender, mildly distended. No hepatosplenomegaly. No bruits or masses. Good bowel sounds. Extremities: no cyanosis, clubbing, rash; 2+ pitting edema of the bilateral lower extremities extending to the knee bilaterally and symmetric Neuro: alert & oriented x 3, cranial nerves grossly intact. moves all 4 extremities w/o difficulty. Affect pleasant.  ECG: Normal sinus rhythm, anteroseptal Q waves, 1 mm ST depressions in leads II, III, aVF, and V5  Results for orders placed or performed during the hospital encounter of 08/15/18 (from the past 24 hour(s))  Lipase, blood     Status: None   Collection Time: 08/15/18  3:27 PM  Result Value Ref Range   Lipase 31 11 - 51 U/L  Comprehensive metabolic panel     Status: Abnormal   Collection Time: 08/15/18  3:27 PM  Result Value Ref Range   Sodium 135 135 - 145 mmol/L   Potassium 4.0 3.5 - 5.1 mmol/L   Chloride 98 98 - 111 mmol/L   CO2 25 22 - 32 mmol/L   Glucose, Bld 205 (H) 70 - 99 mg/dL   BUN 35 (H) 8 - 23 mg/dL   Creatinine, Ser 1.30 (H) 0.61 - 1.24 mg/dL   Calcium 8.4 (L) 8.9 - 10.3 mg/dL   Total Protein 6.1 (L) 6.5 - 8.1 g/dL   Albumin 3.2 (L) 3.5 - 5.0 g/dL   AST 25 15 - 41 U/L   ALT 21 0 - 44 U/L   Alkaline Phosphatase 44 38 - 126 U/L   Total Bilirubin 1.2 0.3 - 1.2 mg/dL   GFR calc non Af Amer 25 (L) >60 mL/min   GFR calc Af Amer 28 (L) >60 mL/min   Anion gap 12 5 - 15  CBC     Status: None   Collection Time: 08/15/18  3:27 PM  Result Value Ref Range   WBC 9.1 4.0 - 10.5 K/uL    RBC 4.69 4.22 - 5.81 MIL/uL   Hemoglobin 13.5 13.0 - 17.0 g/dL   HCT 86.5 78.4 - 69.6 %   MCV 85.7 78.0 - 100.0 fL   MCH 28.8 26.0 - 34.0 pg   MCHC 33.6 30.0 - 36.0 g/dL   RDW 29.5 28.4 - 13.2 %   Platelets 198 150 - 400 K/uL  Urinalysis, Routine w reflex microscopic     Status: Abnormal   Collection Time: 08/15/18  4:16 PM  Result Value Ref Range   Color, Urine YELLOW YELLOW   APPearance  HAZY (A) CLEAR   Specific Gravity, Urine 1.018 1.005 - 1.030   pH 5.0 5.0 - 8.0   Glucose, UA NEGATIVE NEGATIVE mg/dL   Hgb urine dipstick NEGATIVE NEGATIVE   Bilirubin Urine NEGATIVE NEGATIVE   Ketones, ur NEGATIVE NEGATIVE mg/dL   Protein, ur 956100 (A) NEGATIVE mg/dL   Nitrite NEGATIVE NEGATIVE   Leukocytes, UA NEGATIVE NEGATIVE   RBC / HPF 0-5 0 - 5 RBC/hpf   WBC, UA 0-5 0 - 5 WBC/hpf   Bacteria, UA NONE SEEN NONE SEEN   Squamous Epithelial / LPF 0-5 0 - 5   Mucus PRESENT    Hyaline Casts, UA PRESENT   Brain natriuretic peptide     Status: Abnormal   Collection Time: 08/15/18  4:49 PM  Result Value Ref Range   B Natriuretic Peptide 930.7 (H) 0.0 - 100.0 pg/mL  I-stat troponin, ED     Status: Abnormal   Collection Time: 08/15/18  6:45 PM  Result Value Ref Range   Troponin i, poc 0.88 (HH) 0.00 - 0.08 ng/mL   Comment NOTIFIED PHYSICIAN    Comment 3           Ct Abdomen Pelvis Wo Contrast  Result Date: 08/15/2018 CLINICAL DATA:  Abdominal pain and increasing distension 1 episode of vomiting EXAM: CT ABDOMEN AND PELVIS WITHOUT CONTRAST TECHNIQUE: Multidetector CT imaging of the abdomen and pelvis was performed following the standard protocol without IV contrast. COMPARISON:  None. FINDINGS: Lower chest: Small moderate right pleural effusion. Small left pleural effusion. Partial atelectasis versus pneumonia in the right greater than left lung bases. Coronary vascular calcification. Heart size upper normal. No large pericardial effusion. Hepatobiliary: No focal liver abnormality is seen. No  gallstones, gallbladder wall thickening, or biliary dilatation. Pancreas: Unremarkable. No pancreatic ductal dilatation or surrounding inflammatory changes. Spleen: Normal in size without focal abnormality. Adrenals/Urinary Tract: Adrenal glands are unremarkable. Kidneys are normal, without renal calculi, focal lesion, or hydronephrosis. Bladder is unremarkable. Stomach/Bowel: Stomach is within normal limits. Appendix appears normal. No evidence of bowel wall thickening, distention, or inflammatory changes. Vascular/Lymphatic: Mild aortic atherosclerosis. No aneurysm. No significantly enlarged lymph nodes Reproductive: Slightly enlarged prostate Other: Fat within the inguinal canals. No free air. No significant ascites. Musculoskeletal: Degenerative changes. No acute or suspicious abnormality. IMPRESSION: 1. No CT evidence for acute intra-abdominal or pelvic abnormality. 2. Small to moderate right pleural effusion and small left pleural effusion. Partial atelectasis versus pneumonia in the bilateral lung bases. Electronically Signed   By: Jasmine PangKim  Fujinaga M.D.   On: 08/15/2018 19:22   Dg Chest 2 View  Result Date: 08/15/2018 CLINICAL DATA:  Orthopnea.  Clinical concern for CHF. EXAM: CHEST - 2 VIEW COMPARISON:  None. FINDINGS: The cardiac silhouette is enlarged. Mixed pattern pulmonary edema with bilateral moderate in size pleural effusions. Osseous structures are without acute abnormality. Soft tissues are grossly normal. IMPRESSION: Enlarged cardiac silhouette. Mixed pattern pulmonary edema with bilateral moderate in size pleural effusions. Electronically Signed   By: Ted Mcalpineobrinka  Dimitrova M.D.   On: 08/15/2018 17:50   ASSESSMENT: Jose Myers is a 62 y.o. male w/ history of DM2 who presents with new heart failure.  The patient's clinical presentation is consistent with acute decompensated heart failure.  He is warm and wet on exam with no signs of low cardiac output.  He will need a broad work-up for new  heart failure, which will likely need to include cardiac catheterization given his anteroseptal Q waves seen by ECG and the  elevated troponin.  The optimal timing of catheterization is difficult to discern, given the patient's significantly elevated creatinine.  His baseline creatinine is unknown.  PLAN/DISCUSSION: - Recommend aggressive diuresis with IV Lasix, goal net -1 to 2 L daily - Aspirin 81 mg daily - Continue heparin infusion for ACS - TTE in a.m. - Close monitoring of renal function - Repeat troponin at midnight - Start high intensity statin, such as atorvastatin 40 mg daily  - N.p.o. at midnight tonight - Timing of catheterization will be based on improvement in renal function  Rosario Jacks, MD Cardiology Fellow, PGY-6

## 2018-08-15 NOTE — H&P (Signed)
History and Physical    Jose Myers Hasegawa VWU:[id number] DOB: [birth year] DOA: 08/15/2018  PCP: Daisy Florooss, Charles Alan, MD  Patient coming from: Home.  Chief Complaint: Shortness of breath.  HPI: Jose Myers Borg is a 62 y.o. male with history of CAD status post stenting, diabetes mellitus type 2, hypertension has been experiencing abdominal pain over the last 1 week.  Patient states his abdominal pain started after he ate supper trip a week ago.  At that time he also had vomited once.  Denies any diarrhea.  Denies any chest pain.  Has been noticing increasing shortness of breath and lower extremity edema last 24 hours.  ED Course: In the ER on exam patient has significant bilateral lower extremity edema chest x-ray showed bilateral pleural effusion.  BNP is 930.  Troponin is mildly elevated 0.8.  EKG shows old anterior infarct with ST depression in inferior leads.  Cardiology was consulted patient was given Lasix and started on heparin.  CT abdomen pelvis and LFTs are unremarkable.  Abdomen is mildly tender.  Review of Systems: As per HPI, rest all negative.   Past Medical History:  Diagnosis Date  . Diabetes mellitus without complication (HCC)   . Hypertension     History reviewed. No pertinent surgical history.   reports that he has never smoked. He has never used smokeless tobacco. His alcohol and drug histories are not on file.  No Known Allergies  History reviewed. No pertinent family history.  Prior to Admission medications   Medication Sig Start Date End Date Taking? Authorizing Provider  Apple Cid Vn-Grn Tea-Bit Or-Cr (APPLE CIDER VINEGAR PLUS) TABS Take 1 tablet by mouth daily.   Yes [provider]  carvedilol (COREG) 6.25 MG tablet Take 6.25 mg by mouth daily. 06/15/18  Yes [provider]  clomiPHENE (CLOMID) 50 MG tablet Take 25 mg by mouth daily.   Yes [provider]  glimepiride (AMARYL) 4 MG tablet Take 4 mg by mouth 2 (two) times daily.    Yes [provider]  LORazepam (ATIVAN) 1 MG tablet Take 0.5-1 mg by mouth daily as needed for anxiety.   Yes [provider]  losartan (COZAAR) 100 MG tablet Take 100 mg by mouth daily. 06/13/18  Yes [provider]  metFORMIN (GLUCOPHAGE) 500 MG tablet Take 500 mg by mouth 3 (three) times daily.    Yes [provider]  Omega 3 340 MG CPDR Take 1 capsule by mouth at bedtime.   Yes [provider]  Prenat-FePoly-Fered-FA-Omega 3 (DUET DHA 430 PO) Take 1 capsule by mouth daily.   Yes [provider]  sertraline (ZOLOFT) 50 MG tablet Take 25 mg by mouth daily. 07/13/18  Yes [provider]  traZODone (DESYREL) 50 MG tablet Take 50 mg by mouth at bedtime as needed. 08/13/18  Yes [provider]  verapamil (VERELAN PM) 240 MG 24 hr capsule Take 240 mg by mouth 2 (two) times daily.    Yes [provider]    Physical Exam: Vitals:   08/15/18 1900 08/15/18 1915 08/15/18 1945 08/15/18 2000  BP: 120/86 119/87 125/90 (!) 129/91  Pulse: 83 84 90 87  Resp: (!) 22 (!) 23 (!) 27 (!) 26  Temp:      SpO2: 94% 94% 90% 95%  Weight:      Height:          Constitutional: Moderately built and nourished. Vitals:   08/15/18 1900 08/15/18 1915 08/15/18 1945 08/15/18 2000  BP:  120/86 119/87 125/90 (!) 129/91  Pulse: 83 84 90 87  Resp: (!) 22 (!) 23 (!) 27 (!) 26  Temp:      SpO2: 94% 94% 90% 95%  Weight:      Height:       Eyes: Anicteric no pallor. ENMT: No discharge from the ears eyes nose or mouth. Neck: JVD elevated no mass felt. Respiratory: No rhonchi mild crepitations. Cardiovascular: S1-S2 heard no murmurs appreciated. Abdomen: Soft mild epigastric tenderness no guarding or rigidity. Musculoskeletal: Bilateral lower extremity edema present. Skin: No rash. Neurologic: Alert awake oriented to time place and person.  Moves all extremities. Psychiatric: Appears normal.  Normal affect.   Labs on Admission: I have  personally reviewed following labs and imaging studies  CBC: Recent Labs  Lab 08/15/18 1527  WBC 9.1  HGB 13.5  HCT 40.2  MCV 85.7  PLT 198   Basic Metabolic Panel: Recent Labs  Lab 08/15/18 1527  NA 135  K 4.0  CL 98  CO2 25  GLUCOSE 205*  BUN 35*  CREATININE 2.59*  CALCIUM 8.4*   GFR: Estimated Creatinine Clearance: 32.4 mL/min (A) (by C-G formula based on SCr of 2.59 mg/dL (H)). Liver Function Tests: Recent Labs  Lab 08/15/18 1527  AST 25  ALT 21  ALKPHOS 44  BILITOT 1.2  PROT 6.1*  ALBUMIN 3.2*   Recent Labs  Lab 08/15/18 1527  LIPASE 31   No results for input(s): AMMONIA in the last 168 hours. Coagulation Profile: No results for input(s): INR, PROTIME in the last 168 hours. Cardiac Enzymes: No results for input(s): CKTOTAL, CKMB, CKMBINDEX, TROPONINI in the last 168 hours. BNP (last 3 results) No results for input(s): PROBNP in the last 8760 hours. HbA1C: No results for input(s): HGBA1C in the last 72 hours. CBG: No results for input(s): GLUCAP in the last 168 hours. Lipid Profile: No results for input(s): CHOL, HDL, LDLCALC, TRIG, CHOLHDL, LDLDIRECT in the last 72 hours. Thyroid Function Tests: No results for input(s): TSH, T4TOTAL, FREET4, T3FREE, THYROIDAB in the last 72 hours. Anemia Panel: No results for input(s): VITAMINB12, FOLATE, FERRITIN, TIBC, IRON, RETICCTPCT in the last 72 hours. Urine analysis:    Component Value Date/Time   COLORURINE YELLOW 08/15/2018 1616   APPEARANCEUR HAZY (A) 08/15/2018 1616   LABSPEC 1.018 08/15/2018 1616   PHURINE 5.0 08/15/2018 1616   GLUCOSEU NEGATIVE 08/15/2018 1616   HGBUR NEGATIVE 08/15/2018 1616   BILIRUBINUR NEGATIVE 08/15/2018 1616   KETONESUR NEGATIVE 08/15/2018 1616   PROTEINUR 100 (A) 08/15/2018 1616   NITRITE NEGATIVE 08/15/2018 1616   LEUKOCYTESUR NEGATIVE 08/15/2018 1616   Sepsis Labs: @LABRCNTIP (procalcitonin:4,lacticidven:4) )No results found for this or any previous visit (from  the past 240 hour(s)).   Radiological Exams on Admission: Ct Abdomen Pelvis Wo Contrast  Result Date: 08/15/2018 CLINICAL DATA:  Abdominal pain and increasing distension 1 episode of vomiting EXAM: CT ABDOMEN AND PELVIS WITHOUT CONTRAST TECHNIQUE: Multidetector CT imaging of the abdomen and pelvis was performed following the standard protocol without IV contrast. COMPARISON:  None. FINDINGS: Lower chest: Small moderate right pleural effusion. Small left pleural effusion. Partial atelectasis versus pneumonia in the right greater than left lung bases. Coronary vascular calcification. Heart size upper normal. No large pericardial effusion. Hepatobiliary: No focal liver abnormality is seen. No gallstones, gallbladder wall thickening, or biliary dilatation. Pancreas: Unremarkable. No pancreatic ductal dilatation or surrounding inflammatory changes. Spleen: Normal in size without focal abnormality. Adrenals/Urinary Tract: Adrenal glands are unremarkable. Kidneys are normal, without  renal calculi, focal lesion, or hydronephrosis. Bladder is unremarkable. Stomach/Bowel: Stomach is within normal limits. Appendix appears normal. No evidence of bowel wall thickening, distention, or inflammatory changes. Vascular/Lymphatic: Mild aortic atherosclerosis. No aneurysm. No significantly enlarged lymph nodes Reproductive: Slightly enlarged prostate Other: Fat within the inguinal canals. No free air. No significant ascites. Musculoskeletal: Degenerative changes. No acute or suspicious abnormality. IMPRESSION: 1. No CT evidence for acute intra-abdominal or pelvic abnormality. 2. Small to moderate right pleural effusion and small left pleural effusion. Partial atelectasis versus pneumonia in the bilateral lung bases. Electronically Signed   By: Jasmine Pang M.D.   On: 08/15/2018 19:22   Dg Chest 2 View  Result Date: 08/15/2018 CLINICAL DATA:  Orthopnea.  Clinical concern for CHF. EXAM: CHEST - 2 VIEW COMPARISON:  None.  FINDINGS: The cardiac silhouette is enlarged. Mixed pattern pulmonary edema with bilateral moderate in size pleural effusions. Osseous structures are without acute abnormality. Soft tissues are grossly normal. IMPRESSION: Enlarged cardiac silhouette. Mixed pattern pulmonary edema with bilateral moderate in size pleural effusions. Electronically Signed   By: Ted Mcalpine M.D.   On: 08/15/2018 17:50    EKG: Independently reviewed.  Normal sinus rhythm with old 6.  ST depression inferior leads.  Assessment/Plan Active Problems:   Acute CHF (congestive heart failure) (HCC)   Acute renal failure (ARF) (HCC)   Elevated troponin   Abdominal pain   Type 2 diabetes mellitus with renal manifestations (HCC)    1. Acute congestive heart failure EF unknown.  Lasix 80 mg IV was given to the ER.  May need further doses based on the response.  Appreciate cardiology consult.  Certainly cardiac markers patient kept n.p.o. past midnight in anticipation of cardiac cath.  Check 2D echo. 2. Elevated troponin concerning for non-ST elevation of by/CHF related to stress -patient is on heparin infusion in the past.  Coreg and verapamil.  Statins. 3. Abdominal pain -unremarkable CT abdomen and normal LFTs and lipase.  Closely observe.  Could be gastritis. 4. Acute renal failure no old labs to compare.  Urine does show proteinuria so that could be a cardiac component.  We discussed with patient's primary care physician Dr.Allen Ross in the [facility] with regarding to patient's baseline labs to compare.  Holding Cozaar due to worsening renal function. 5. Diabetes mellitus type 2 we will keep patient on sliding scale coverage for now. 6. Hypertension on beta-blocker verapamil.  Holding Cozaar due to worsening renal function.   DVT prophylaxis: Heparin infusion. Code Status: Full code. Family Communication: Patient's family at the bedside. Disposition Plan: Home. Consults called: Cardiology. Admission status:  Inpatient.   Eduard Clos MD [facility] Pager [phone].  If 7PM-7AM, please contact night-coverage [email or web address] Password [facility]  08/15/2018, 8:33 PM

## 2018-08-15 NOTE — Progress Notes (Signed)
ANTICOAGULATION CONSULT NOTE - Initial Consult  Pharmacy Consult for heparin Indication: chest pain/ACS  No Known Allergies  Patient Measurements: Height: 5\' 9"  (175.3 cm) Weight: 205 lb (93 kg) IBW/kg (Calculated) : 70.7 Heparin Dosing Weight: 89.8 kg  Vital Signs: Temp: 97.6 F (36.4 C) (08/18 1518) BP: 129/91 (08/18 2000) Pulse Rate: 87 (08/18 2000)  Labs: Recent Labs    08/15/18 1527  HGB 13.5  HCT 40.2  PLT 198  CREATININE 2.59*    Estimated Creatinine Clearance: 32.4 mL/min (A) (by C-G formula based on SCr of 2.59 mg/dL (H)).   Medical History: Past Medical History:  Diagnosis Date  . Diabetes mellitus without complication (HCC)   . Hypertension    Assessment: [age] yom presents with 9 days of abdominal pain and increasing distension. EKG today showing anterior infarct (old) and minimal ST depression (inferior leads).  No anticoagulation PTA.  Hgb 13.5, plt 198. Troponin 0.88. Scr 2.59.   Goal of Therapy:  Heparin level 0.3-0.7 units/ml Monitor platelets by anticoagulation protocol: Yes   Plan:  Give 4000 units bolus x 1 Start heparin infusion at 1050 units/hr Check anti-Xa level in 8 hours and daily while on heparin Continue to monitor H&H and platelets  Girard CooterKimberly Perkins, PharmD Clinical Pharmacist  Pager: [phone] Phone: [phone] 08/15/2018,8:14 PM

## 2018-08-15 NOTE — ED Notes (Signed)
Admitting physician at the bedside.  To take up once he is finished with the patient.

## 2018-08-15 NOTE — ED Triage Notes (Signed)
Pt arrives from [facility] for further evaluation of 9 days of abdominal pain and increasing distension. Abdomen is firm and distended. Pt ankles and feet are also red and swollen, this started yesterday. He had 1 episode of vomiting 9 days ago. He took medications to treat constipation and has had some diarrhea.

## 2018-08-15 NOTE — ED Provider Notes (Signed)
[facility] EMERGENCY DEPARTMENT Provider Note   CSN: [id number] Arrival date & time: 08/15/18  1459     History   Chief Complaint Chief Complaint  Patient presents with  . Abdominal Pain  . Leg Swelling    HPI SAIQUAN HANDS is a 62 y.o. male with a past medical history of hypertension, DM 2, who presents today for evaluation of abdominal pain, diarrhea, and nausea.  He reports that approximately 10 days ago they ate out at a restaurant when he ate shrimp and then after that he got ill.  He reports initially that when he threw up he felt better.  He is seen his primary care doctor multiple times for this and just sent him here today for evaluation.  He reports that over the past 10 days he has had increasing abdominal swelling and distention, his abdomen is generally painful.  He reports that he is still having diarrhea, last episode was about 10 minutes before I came in the room.  Also during this past 10 days he has had to sleep on an increasing number of pillows.  He reports that he is unable to sleep laying down at all, he has to sleep fully sitting up.  He also reports increasing shortness of breath.  HPI  Past Medical History:  Diagnosis Date  . Diabetes mellitus without complication (HCC)   . Hypertension     Patient Active Problem List   Diagnosis Date Noted  . Pain in left knee 03/08/2018    No past surgical history on file.      Home Medications    Prior to Admission medications   Medication Sig Start Date End Date Taking? Authorizing Provider  Apple Cid Vn-Grn Tea-Bit Or-Cr (APPLE CIDER VINEGAR PLUS) TABS Take 1 tablet by mouth daily.   Yes [provider]  carvedilol (COREG) 6.25 MG tablet Take 6.25 mg by mouth daily. 06/15/18  Yes [provider]  clomiPHENE (CLOMID) 50 MG tablet Take 25 mg by mouth daily.   Yes [provider]  glimepiride (AMARYL) 4 MG tablet Take 4 mg by mouth 2 (two) times daily.   Yes  [provider]  LORazepam (ATIVAN) 1 MG tablet Take 0.5-1 mg by mouth daily as needed for anxiety.   Yes [provider]  losartan (COZAAR) 100 MG tablet Take 100 mg by mouth daily. 06/13/18  Yes [provider]  metFORMIN (GLUCOPHAGE) 500 MG tablet Take 500 mg by mouth 3 (three) times daily.    Yes [provider]  Omega 3 340 MG CPDR Take 1 capsule by mouth at bedtime.   Yes [provider]  Prenat-FePoly-Fered-FA-Omega 3 (DUET DHA 430 PO) Take 1 capsule by mouth daily.   Yes [provider]  sertraline (ZOLOFT) 50 MG tablet Take 25 mg by mouth daily. 07/13/18  Yes [provider]  traZODone (DESYREL) 50 MG tablet Take 50 mg by mouth at bedtime as needed. 08/13/18  Yes [provider]  verapamil (VERELAN PM) 240 MG 24 hr capsule Take 240 mg by mouth 2 (two) times daily.    Yes [provider]    Family History No family history on file.  Social History Social History   Tobacco Use  . Smoking status: Never Smoker  . Smokeless tobacco: Never Used  Substance Use Topics  . Alcohol use: Not on file  . Drug use: Not on file     Allergies   Patient has no known allergies.  Review of Systems Review of Systems  Constitutional: Negative for chills and fever.  HENT: Negative for ear pain and sore throat.   Eyes: Negative for pain and visual disturbance.  Respiratory: Positive for shortness of breath. Negative for cough and chest tightness.   Cardiovascular: Positive for leg swelling. Negative for chest pain and palpitations.  Gastrointestinal: Positive for abdominal distention, abdominal pain, diarrhea and nausea. Negative for vomiting.  Genitourinary: Negative for dysuria and hematuria.  Musculoskeletal: Negative for arthralgias and back pain.  Skin: Negative for color change and rash.  Neurological: Negative for dizziness, seizures, syncope and weakness.  All other systems reviewed and are  negative.    Physical Exam Updated Vital Signs BP 98/75   Pulse 74   Temp 97.6 F (36.4 C)   Resp 18   Ht 5\' 9"  (1.753 m)   Wt 93 kg   SpO2 99%   BMI 30.27 kg/m   Physical Exam  Constitutional: He appears well-developed and well-nourished.  Non-toxic appearance. No distress.  HENT:  Head: Normocephalic and atraumatic.  Eyes: Conjunctivae are normal.  Neck: Neck supple.  Cardiovascular: Normal rate, regular rhythm, normal heart sounds and intact distal pulses.  No murmur heard. 1+ pitting edema to bilateral lower extremities.  Pulmonary/Chest: Effort normal. No respiratory distress. He has decreased breath sounds in the right lower field and the left lower field.  Abdominal: He exhibits distension. There is generalized tenderness. There is no rigidity.  Musculoskeletal: He exhibits no edema.  Neurological: He is alert.  Skin: Skin is warm and dry.  Psychiatric: He has a normal mood and affect.  Nursing note and vitals reviewed.    ED Treatments / Results  Labs (all labs ordered are listed, but only abnormal results are displayed) Labs Reviewed  COMPREHENSIVE METABOLIC PANEL - Abnormal; Notable for the following components:      Result Value   Glucose, Bld 205 (*)    BUN 35 (*)    Creatinine, Ser 2.59 (*)    Calcium 8.4 (*)    Total Protein 6.1 (*)    Albumin 3.2 (*)    GFR calc non Af Amer 25 (*)    GFR calc Af Amer 28 (*)    All other components within normal limits  URINALYSIS, ROUTINE W REFLEX MICROSCOPIC - Abnormal; Notable for the following components:   APPearance HAZY (*)    Protein, ur 100 (*)    All other components within normal limits  BRAIN NATRIURETIC PEPTIDE - Abnormal; Notable for the following components:   B Natriuretic Peptide 930.7 (*)    All other components within normal limits  TROPONIN I - Abnormal; Notable for the following components:   Troponin I 1.86 (*)    All other components within normal limits  I-STAT TROPONIN, ED - Abnormal;  Notable for the following components:   Troponin i, poc 0.88 (*)    All other components within normal limits  LIPASE, BLOOD  CBC  MAGNESIUM  TSH  HEPARIN LEVEL (UNFRACTIONATED)  CBC  HIV ANTIBODY (ROUTINE TESTING)  BASIC METABOLIC PANEL  HEPATIC FUNCTION PANEL  TROPONIN I  TROPONIN I    EKG EKG Interpretation  Date/Time:  Sunday August 15 2018 16:35:19 EDT Ventricular Rate:  70 PR Interval:    QRS Duration: 112 QT Interval:  420 QTC Calculation: 454 R Axis:   14 Text Interpretation:  Sinus rhythm Anterior infarct, old Minimal ST depression, inferior leads When comapred to prior, new ST depressions.  No STEMI Confirmed by  Tegeler, Thayer Ohmhris ([id number]) on 08/15/2018 4:39:23 PM Also confirmed by Theda Belfastegeler, Chris ([id number]), editor Barbette Hairassel, Kerry [id number])  on 08/15/2018 4:54:54 PM   Radiology Ct Abdomen Pelvis Wo Contrast  Result Date: 08/15/2018 CLINICAL DATA:  Abdominal pain and increasing distension 1 episode of vomiting EXAM: CT ABDOMEN AND PELVIS WITHOUT CONTRAST TECHNIQUE: Multidetector CT imaging of the abdomen and pelvis was performed following the standard protocol without IV contrast. COMPARISON:  None. FINDINGS: Lower chest: Small moderate right pleural effusion. Small left pleural effusion. Partial atelectasis versus pneumonia in the right greater than left lung bases. Coronary vascular calcification. Heart size upper normal. No large pericardial effusion. Hepatobiliary: No focal liver abnormality is seen. No gallstones, gallbladder wall thickening, or biliary dilatation. Pancreas: Unremarkable. No pancreatic ductal dilatation or surrounding inflammatory changes. Spleen: Normal in size without focal abnormality. Adrenals/Urinary Tract: Adrenal glands are unremarkable. Kidneys are normal, without renal calculi, focal lesion, or hydronephrosis. Bladder is unremarkable. Stomach/Bowel: Stomach is within normal limits. Appendix appears normal. No evidence of bowel wall thickening, distention, or  inflammatory changes. Vascular/Lymphatic: Mild aortic atherosclerosis. No aneurysm. No significantly enlarged lymph nodes Reproductive: Slightly enlarged prostate Other: Fat within the inguinal canals. No free air. No significant ascites. Musculoskeletal: Degenerative changes. No acute or suspicious abnormality. IMPRESSION: 1. No CT evidence for acute intra-abdominal or pelvic abnormality. 2. Small to moderate right pleural effusion and small left pleural effusion. Partial atelectasis versus pneumonia in the bilateral lung bases. Electronically Signed   By: Jasmine PangKim  Fujinaga M.D.   On: 08/15/2018 19:22   Dg Chest 2 View  Result Date: 08/15/2018 CLINICAL DATA:  Orthopnea.  Clinical concern for CHF. EXAM: CHEST - 2 VIEW COMPARISON:  None. FINDINGS: The cardiac silhouette is enlarged. Mixed pattern pulmonary edema with bilateral moderate in size pleural effusions. Osseous structures are without acute abnormality. Soft tissues are grossly normal. IMPRESSION: Enlarged cardiac silhouette. Mixed pattern pulmonary edema with bilateral moderate in size pleural effusions. Electronically Signed   By: Ted Mcalpineobrinka  Dimitrova M.D.   On: 08/15/2018 17:50    Procedures Procedures (including critical care time) CRITICAL CARE Performed by: Lyndel SafeElizabeth Suellyn Meenan Total critical care time: 30 minutes Critical care time was exclusive of separately billable procedures and treating other patients. Critical care was necessary to treat or prevent imminent or life-threatening deterioration. Critical care was time spent personally by me on the following activities: development of treatment plan with patient and/or surrogate as well as nursing, discussions with consultants, evaluation of patient's response to treatment, examination of patient, obtaining history from patient or surrogate, ordering and performing treatments and interventions, ordering and review of laboratory studies, ordering and review of radiographic studies, pulse oximetry  and re-evaluation of patient's condition. Nstemi  Medications Ordered in ED Medications  aspirin chewable tablet 324 mg (324 mg Oral Given 08/15/18 2006)  furosemide (LASIX) injection 80 mg (80 mg Intravenous Given 08/15/18 2007)  heparin bolus via infusion 4,000 Units (4,000 Units Intravenous Bolus from Bag 08/15/18 2059)     Initial Impression / Assessment and Plan / ED Course  I have reviewed the triage vital signs and the nursing notes.  Pertinent labs & imaging results that were available during my care of the patient were reviewed by me and considered in my medical decision making (see chart for details).  Clinical Course as of Aug 16 140  Sun Aug 15, 2018  1718 Hgb 14.7, CR 1.06 in April.    [EH]  1931 Spoke with cardiology   [EH]  2018 Spoke with hospitalist who will  admit patient.    [EH]    Clinical Course User Index [EH] Cristina Gong, PA-C   Vivien Presto [place] presents today for evaluation of abdominal pain and leg swelling which is been going on for approximately 10 days.  He has associated nausea vomiting and diarrhea.  On physical exam his abdomen was soft however distended and he had +1 pitting edema to his bilateral lower extremities.    He denied any chest pain, however he does have significant orthopnea and has been having to sleep straight upright.  Chest x-ray showed bilateral pleural effusions along with pulmonary congestion.  Cardiology was consulted who recommended heparinizing, giving him Lasix, aspirin, and admission for possible cath tomorrow depending on his kidney function.  His last set of labs from PCP was around April showing a creatinine of 1.6.  Today he has a significant elevation to 2.59. Representing an AKI.    I spoke with the hospitalist with plan to admit the patient.  Final Clinical Impressions(s) / ED Diagnoses   Final diagnoses:  Hypervolemia, unspecified hypervolemia type  Generalized abdominal pain  NSTEMI (non-ST elevated  myocardial infarction) [facility])    ED Discharge Orders    None       Cristina Gong, [place] 08/16/18 0151    Tegeler, Canary Brim, MD 08/16/18 1137

## 2018-08-16 ENCOUNTER — Other Ambulatory Visit: Payer: Self-pay

## 2018-08-16 DIAGNOSIS — N179 Acute kidney failure, unspecified: Secondary | ICD-10-CM

## 2018-08-16 DIAGNOSIS — I509 Heart failure, unspecified: Secondary | ICD-10-CM

## 2018-08-16 DIAGNOSIS — E1121 Type 2 diabetes mellitus with diabetic nephropathy: Secondary | ICD-10-CM

## 2018-08-16 DIAGNOSIS — I214 Non-ST elevation (NSTEMI) myocardial infarction: Principal | ICD-10-CM

## 2018-08-16 LAB — CBC
HEMATOCRIT: 41.5 % (ref 39.0–52.0)
Hemoglobin: 14 g/dL (ref 13.0–17.0)
MCH: 28.3 pg (ref 26.0–34.0)
MCHC: 33.7 g/dL (ref 30.0–36.0)
MCV: 83.8 fL (ref 78.0–100.0)
PLATELETS: 193 10*3/uL (ref 150–400)
RBC: 4.95 MIL/uL (ref 4.22–5.81)
RDW: 13.5 % (ref 11.5–15.5)
WBC: 7.7 10*3/uL (ref 4.0–10.5)

## 2018-08-16 LAB — GLUCOSE, CAPILLARY
GLUCOSE-CAPILLARY: 129 mg/dL — AB (ref 70–99)
Glucose-Capillary: 232 mg/dL — ABNORMAL HIGH (ref 70–99)
Glucose-Capillary: 132 mg/dL — ABNORMAL HIGH (ref 70–99)
Glucose-Capillary: 193 mg/dL — ABNORMAL HIGH (ref 70–99)

## 2018-08-16 LAB — BASIC METABOLIC PANEL
Anion gap: 12 (ref 5–15)
BUN: 33 mg/dL — AB (ref 8–23)
CHLORIDE: 101 mmol/L (ref 98–111)
CO2: 25 mmol/L (ref 22–32)
CREATININE: 2.11 mg/dL — AB (ref 0.61–1.24)
Calcium: 8.5 mg/dL — ABNORMAL LOW (ref 8.9–10.3)
GFR calc Af Amer: 36 mL/min — ABNORMAL LOW (ref >60)
GFR calc non Af Amer: 31 mL/min — ABNORMAL LOW (ref >60)
GLUCOSE: 141 mg/dL — AB (ref 70–99)
Potassium: 3.3 mmol/L — ABNORMAL LOW (ref 3.5–5.1)
Sodium: 138 mmol/L (ref 135–145)

## 2018-08-16 LAB — LIPID PANEL
Cholesterol: 141 mg/dL (ref 0–200)
HDL: 25 mg/dL — ABNORMAL LOW (ref >40)
LDL CALC: 83 mg/dL (ref 0–99)
Total CHOL/HDL Ratio: 5.6 RATIO
Triglycerides: 163 mg/dL — ABNORMAL HIGH (ref <150)
VLDL: 33 mg/dL (ref 0–40)

## 2018-08-16 LAB — HEPATIC FUNCTION PANEL
ALT: 19 U/L (ref 0–44)
AST: 25 U/L (ref 15–41)
Albumin: 3.2 g/dL — ABNORMAL LOW (ref 3.5–5.0)
Alkaline Phosphatase: 44 U/L (ref 38–126)
BILIRUBIN DIRECT: 0.2 mg/dL (ref 0.0–0.2)
BILIRUBIN INDIRECT: 0.7 mg/dL (ref 0.3–0.9)
BILIRUBIN TOTAL: 0.9 mg/dL (ref 0.3–1.2)
Total Protein: 6 g/dL — ABNORMAL LOW (ref 6.5–8.1)

## 2018-08-16 LAB — TROPONIN I
TROPONIN I: 1.95 ng/mL — AB (ref <0.03)
Troponin I: 1.96 ng/mL (ref <0.03)

## 2018-08-16 LAB — HEPARIN LEVEL (UNFRACTIONATED)
HEPARIN UNFRACTIONATED: 0.16 [IU]/mL — AB (ref 0.30–0.70)
Heparin Unfractionated: <0.1 IU/mL — ABNORMAL LOW (ref 0.30–0.70)
Heparin Unfractionated: 0.46 IU/mL (ref 0.30–0.70)

## 2018-08-16 LAB — MAGNESIUM: MAGNESIUM: 2.4 mg/dL (ref 1.7–2.4)

## 2018-08-16 LAB — HIV ANTIBODY (ROUTINE TESTING W REFLEX): HIV Screen 4th Generation wRfx: NONREACTIVE

## 2018-08-16 MED ORDER — HEPARIN BOLUS VIA INFUSION
3000.0000 [IU] | Freq: Once | INTRAVENOUS | Status: AC
  Administered 2018-08-16: 3000 [IU] via INTRAVENOUS
  Filled 2018-08-16: qty 3000

## 2018-08-16 MED ORDER — ASPIRIN EC 81 MG PO TBEC
81.0000 mg | DELAYED_RELEASE_TABLET | Freq: Every day | ORAL | Status: DC
  Administered 2018-08-16 – 2018-08-18 (×3): 81 mg via ORAL
  Filled 2018-08-16 (×3): qty 1

## 2018-08-16 MED ORDER — POTASSIUM CHLORIDE CRYS ER 20 MEQ PO TBCR
40.0000 meq | EXTENDED_RELEASE_TABLET | ORAL | Status: AC
  Administered 2018-08-16 (×2): 40 meq via ORAL
  Filled 2018-08-16 (×2): qty 2

## 2018-08-16 MED ORDER — FUROSEMIDE 10 MG/ML IJ SOLN
40.0000 mg | Freq: Two times a day (BID) | INTRAMUSCULAR | Status: DC
  Administered 2018-08-16 – 2018-08-20 (×9): 40 mg via INTRAVENOUS
  Filled 2018-08-16 (×9): qty 4

## 2018-08-16 MED ORDER — INSULIN ASPART 100 UNIT/ML ~~LOC~~ SOLN
0.0000 [IU] | Freq: Three times a day (TID) | SUBCUTANEOUS | Status: DC
  Administered 2018-08-16: 1 [IU] via SUBCUTANEOUS
  Administered 2018-08-16: 3 [IU] via SUBCUTANEOUS
  Administered 2018-08-17: 1 [IU] via SUBCUTANEOUS
  Administered 2018-08-17: 3 [IU] via SUBCUTANEOUS
  Administered 2018-08-17 – 2018-08-18 (×2): 2 [IU] via SUBCUTANEOUS
  Administered 2018-08-18: 5 [IU] via SUBCUTANEOUS
  Administered 2018-08-18 – 2018-08-19 (×3): 2 [IU] via SUBCUTANEOUS
  Administered 2018-08-20: 3 [IU] via SUBCUTANEOUS

## 2018-08-16 MED ORDER — CARVEDILOL 6.25 MG PO TABS
6.2500 mg | ORAL_TABLET | Freq: Two times a day (BID) | ORAL | Status: DC
  Administered 2018-08-16 – 2018-08-20 (×8): 6.25 mg via ORAL
  Filled 2018-08-16 (×8): qty 1

## 2018-08-16 MED ORDER — ASPIRIN 325 MG PO TABS: 325.0000 mg | ORAL_TABLET | Freq: Every day | ORAL | Status: DC

## 2018-08-16 MED ORDER — HEPARIN BOLUS VIA INFUSION
2500.0000 [IU] | Freq: Once | INTRAVENOUS | Status: DC
  Filled 2018-08-16: qty 2500

## 2018-08-16 MED ORDER — ATORVASTATIN CALCIUM 40 MG PO TABS
40.0000 mg | ORAL_TABLET | Freq: Every day | ORAL | Status: DC
  Administered 2018-08-16 – 2018-09-05 (×21): 40 mg via ORAL
  Filled 2018-08-16 (×21): qty 1

## 2018-08-16 NOTE — Progress Notes (Signed)
ANTICOAGULATION CONSULT NOTE   Pharmacy Consult:  Heparin Indication: chest pain/ACS  No Known Allergies  Patient Measurements: Height: 5\' 9"  (175.3 cm) Weight: 212 lb 4.8 oz (96.3 kg)(Scale A ) IBW/kg (Calculated) : 70.7 Heparin Dosing Weight: 90 kg  Vital Signs: Temp: 98.4 F (36.9 C) (08/19 1242) Temp Source: Oral (08/19 1242) BP: 111/83 (08/19 1242) Pulse Rate: 90 (08/19 1242)  Labs: Recent Labs    08/15/18 1527 08/15/18 2218 08/16/18 0418 08/16/18 1004 08/16/18 1414  HGB 13.5  --  14.0  --   --   HCT 40.2  --  41.5  --   --   PLT 198  --  193  --   --   HEPARINUNFRC  --   --  <0.10*  --  0.16*  CREATININE 2.59*  --  2.11*  --   --   TROPONINI  --  1.86* 1.96* 1.95*  --     Estimated Creatinine Clearance: 40.5 mL/min (A) (by C-G formula based on SCr of 2.11 mg/dL (H)).   Assessment: [age] YOM presents with 9 days of abdominal pain and increasing distension. EKG showing anterior infarct (old) and minimal ST depression (inferior leads).  Pharmacy has been consulted to dose IV heparin for ACS.  Heparin level is sub-therapeutic but trending up.  No bleeding nor issue with heparin infusion.  Renal function is improving.   Goal of Therapy:  Heparin level 0.3-0.7 units/ml Monitor platelets by anticoagulation protocol: Yes    Plan:  Heparin 2500 units IV bolus, then Increase heparin gtt to 1450 units/hr Check 6 hr heparin level Daily heparin level and CBC   Ksenia Kunz D. Laney Potashang, PharmD, BCPS, BCCCP 08/16/2018, 3:05 PM

## 2018-08-16 NOTE — Progress Notes (Signed)
PROGRESS NOTE    Jose Myers  ONG:[id number] DOB: Apr 29, 1956 DOA: 08/15/2018 PCP: Daisy Floro, MD    Brief Narrative: Jose Myers is a 62 y.o. male with history of CAD status post stenting, diabetes mellitus type 2, hypertension has been experiencing abdominal pain over the last 1 week.  Patient states his abdominal pain started after he ate supper trip a week ago.  At that time he also had vomited once.  Denies any diarrhea.  Denies any chest pain.  Has been noticing increasing shortness of breath and lower extremity edema last 24 hours.  ED Course: In the ER on exam patient has significant bilateral lower extremity edema chest x-ray showed bilateral pleural effusion.  BNP is 930.  Troponin is mildly elevated 0.8.  EKG shows old anterior infarct with ST depression in inferior leads.  Cardiology was consulted patient was given Lasix and started on heparin.  CT abdomen pelvis and LFTs are unremarkable.  Abdomen is mildly tender.   Assessment & Plan:   Active Problems:   Acute CHF (congestive heart failure) (HCC)   Acute renal failure (ARF) (HCC)   Elevated troponin   Abdominal pain   Type 2 diabetes mellitus with renal manifestations (HCC)   1-Acute Heart failure;  Appreciate Cardiology evaluation.  continue with lasix.  Monitor renal function.    2-Non STEMI;  On heparin Gtt.  Cardiology planning cath this admission, when renal function allows it.   Acute renal failure;  Request records from PCP>  Monitor on lasix.   DM;  SSI  HTN;  Hold cozaar due to AKI.  On BB Verapamil discontinue by cardiology/     DVT prophylaxis: heparin  Code Status: full code.  Family Communication: wife at bedside.  Disposition Plan: remain inpatient for treatment of NON STEMI  Consultants:   Cardiology    Procedures:   ECHO ordered.    Antimicrobials:   none   Subjective: He is feeling better.  He has been having abdominal discomfort for one week.    He is breathing better   Objective: Vitals:   08/16/18 0500 08/16/18 0509 08/16/18 0900 08/16/18 1242  BP:  (!) 131/98 (!) 139/96 111/83  Pulse:  98 99 90  Resp:  19    Temp:  99.5 F (37.5 C)  98.4 F (36.9 C)  TempSrc:  Oral  Oral  SpO2:  95%  96%  Weight: 96.3 kg     Height:        Intake/Output Summary (Last 24 hours) at 08/16/2018 1406 Last data filed at 08/16/2018 1152 Gross per 24 hour  Intake 462.49 ml  Output 1950 ml  Net -1487.51 ml   Filed Weights   08/15/18 1519 08/16/18 0500  Weight: 93 kg 96.3 kg    Examination:  General exam: Appears calm and comfortable  Respiratory system: Clear to auscultation. Respiratory effort normal. Cardiovascular system: S1 & S2 heard, RRR. No JVD, murmurs, rubs, gallops or clicks. Plus 2 edema  Gastrointestinal system: Abdomen is distended, soft and nontender. No organomegaly or masses felt. Normal bowel sounds heard. Central nervous system: Alert and oriented. No focal neurological deficits. Extremities: Symmetric 5 x 5 power. Skin: No rashes, lesions or ulcers Psychiatry: Judgement and insight appear normal. Mood & affect appropriate.     Data Reviewed: I have personally reviewed following labs and imaging studies  CBC: Recent Labs  Lab 08/15/18 1527 08/16/18 0418  WBC 9.1 7.7  HGB 13.5 14.0  HCT 40.2 41.5  MCV 85.7 83.8  PLT 198 193   Basic Metabolic Panel: Recent Labs  Lab 08/15/18 1527 08/15/18 2218 08/16/18 0418 08/16/18 1004  NA 135  --  138  --   K 4.0  --  3.3*  --   CL 98  --  101  --   CO2 25  --  25  --   GLUCOSE 205*  --  141*  --   BUN 35*  --  33*  --   CREATININE 2.59*  --  2.11*  --   CALCIUM 8.4*  --  8.5*  --   MG  --  2.4  --  2.4   GFR: Estimated Creatinine Clearance: 40.5 mL/min (A) (by C-G formula based on SCr of 2.11 mg/dL (H)). Liver Function Tests: Recent Labs  Lab 08/15/18 1527 08/16/18 0418  AST 25 25  ALT 21 19  ALKPHOS 44 44  BILITOT 1.2 0.9  PROT 6.1* 6.0*   ALBUMIN 3.2* 3.2*   Recent Labs  Lab 08/15/18 1527  LIPASE 31   No results for input(s): AMMONIA in the last 168 hours. Coagulation Profile: No results for input(s): INR, PROTIME in the last 168 hours. Cardiac Enzymes: Recent Labs  Lab 08/15/18 2218 08/16/18 0418 08/16/18 1004  TROPONINI 1.86* 1.96* 1.95*   BNP (last 3 results) No results for input(s): PROBNP in the last 8760 hours. HbA1C: No results for input(s): HGBA1C in the last 72 hours. CBG: Recent Labs  Lab 08/16/18 1000 08/16/18 1200  GLUCAP 129* 232*   Lipid Profile: Recent Labs    08/16/18 1004  CHOL 141  HDL 25*  LDLCALC 83  TRIG 161163*  CHOLHDL 5.6   Thyroid Function Tests: Recent Labs    08/15/18 2218  TSH 0.907   Anemia Panel: No results for input(s): VITAMINB12, FOLATE, FERRITIN, TIBC, IRON, RETICCTPCT in the last 72 hours. Sepsis Labs: No results for input(s): PROCALCITON, LATICACIDVEN in the last 168 hours.  No results found for this or any previous visit (from the past 240 hour(s)).       Radiology Studies: Ct Abdomen Pelvis Wo Contrast  Result Date: 08/15/2018 CLINICAL DATA:  Abdominal pain and increasing distension 1 episode of vomiting EXAM: CT ABDOMEN AND PELVIS WITHOUT CONTRAST TECHNIQUE: Multidetector CT imaging of the abdomen and pelvis was performed following the standard protocol without IV contrast. COMPARISON:  None. FINDINGS: Lower chest: Small moderate right pleural effusion. Small left pleural effusion. Partial atelectasis versus pneumonia in the right greater than left lung bases. Coronary vascular calcification. Heart size upper normal. No large pericardial effusion. Hepatobiliary: No focal liver abnormality is seen. No gallstones, gallbladder wall thickening, or biliary dilatation. Pancreas: Unremarkable. No pancreatic ductal dilatation or surrounding inflammatory changes. Spleen: Normal in size without focal abnormality. Adrenals/Urinary Tract: Adrenal glands are  unremarkable. Kidneys are normal, without renal calculi, focal lesion, or hydronephrosis. Bladder is unremarkable. Stomach/Bowel: Stomach is within normal limits. Appendix appears normal. No evidence of bowel wall thickening, distention, or inflammatory changes. Vascular/Lymphatic: Mild aortic atherosclerosis. No aneurysm. No significantly enlarged lymph nodes Reproductive: Slightly enlarged prostate Other: Fat within the inguinal canals. No free air. No significant ascites. Musculoskeletal: Degenerative changes. No acute or suspicious abnormality. IMPRESSION: 1. No CT evidence for acute intra-abdominal or pelvic abnormality. 2. Small to moderate right pleural effusion and small left pleural effusion. Partial atelectasis versus pneumonia in the bilateral lung bases. Electronically Signed   By: Jasmine PangKim  Fujinaga M.D.   On: 08/15/2018 19:22   Dg Chest 2 View  Result Date: 08/15/2018 CLINICAL DATA:  Orthopnea.  Clinical concern for CHF. EXAM: CHEST - 2 VIEW COMPARISON:  None. FINDINGS: The cardiac silhouette is enlarged. Mixed pattern pulmonary edema with bilateral moderate in size pleural effusions. Osseous structures are without acute abnormality. Soft tissues are grossly normal. IMPRESSION: Enlarged cardiac silhouette. Mixed pattern pulmonary edema with bilateral moderate in size pleural effusions. Electronically Signed   By: Ted Mcalpineobrinka  Dimitrova M.D.   On: 08/15/2018 17:50        Scheduled Meds: . aspirin EC  81 mg Oral Daily  . atorvastatin  40 mg Oral q1800  . carvedilol  6.25 mg Oral BID WC  . clomiPHENE  25 mg Oral Daily  . furosemide  40 mg Intravenous BID  . insulin aspart  0-9 Units Subcutaneous TID WC  . omega-3 acid ethyl esters  1 g Oral QHS  . sertraline  25 mg Oral Daily   Continuous Infusions: . heparin 1,200 Units/hr (08/16/18 1116)     LOS: 1 day    Time spent: 35 minutes.     Alba CoryBelkys A Pantera Winterrowd, MD [facility] Pager [phone]  If 7PM-7AM, please contact  night-coverage [email or web address] Password [email or web address] 08/16/2018, 2:06 PM

## 2018-08-16 NOTE — Progress Notes (Signed)
Progress Note  Patient Name: Jose Rasmussenommy L Pacholski Date of Encounter: 08/16/2018  Primary Cardiologist: No primary care provider on file.   Subjective   Patient feeling much better this AM after removing fluid. He reports feeling 1 L urine jug four times (only charted as -1.4L), leg swelling and abdominal swelling improved.  He tells me that about 10 days ago (8/9) he felt poorly after eating at a [employer] [place]. He had abdominal bloating and pain, and he took a lot of antacids. He reports going to [facility] two days later and having blood work done, and he was told everything was normal. Since that time he hasn't felt well. He has had minimal PO intake, and he has continued to feel bloated in his abdomen. Over the same time, he endorses PND, orthopnea (moved to sleep in chair), DOE, LE edema, and cough. He had a cardiac stent place 20 years ago by [facility] after an abnormal stress test, but he did not have significant symptoms at that time. He has had no issues since, and he has not had a repeat stress test. He has had diabetes for several years, initially well controlled on diet and now on medications. He does not think anyone has told him that he has kidney issues.  He has had no chest pain, and he is not currently having abdominal pain. He does occasionally have sharp left shoulder pain, which is tender to palpation at the site of a prior shoulder surgery.  Inpatient Medications    Scheduled Meds: . aspirin  325 mg Oral Daily  . atorvastatin  40 mg Oral q1800  . carvedilol  6.25 mg Oral Daily  . clomiPHENE  25 mg Oral Daily  . insulin aspart  0-9 Units Subcutaneous Q4H  . omega-3 acid ethyl esters  1 g Oral QHS  . sertraline  25 mg Oral Daily  . verapamil  240 mg Oral BID   Continuous Infusions: . heparin 1,200 Units/hr (08/16/18 0610)   PRN Meds: acetaminophen **OR** acetaminophen, LORazepam, ondansetron **OR** ondansetron (ZOFRAN) IV, traZODone   Vital Signs     Vitals:   08/15/18 2115 08/15/18 2143 08/16/18 0500 08/16/18 0509  BP: (!) 135/95 (!) 136/92  (!) 131/98  Pulse: 88 91  98  Resp: 20 18  19   Temp:  97.6 F (36.4 C)  99.5 F (37.5 C)  TempSrc:  Oral  Oral  SpO2: 97% 95%  95%  Weight:   96.3 kg   Height:        Intake/Output Summary (Last 24 hours) at 08/16/2018 0848 Last data filed at 08/16/2018 [id number] Gross per 24 hour  Intake 0 ml  Output 1400 ml  Net -1400 ml   Filed Weights   08/15/18 1519 08/16/18 0500  Weight: 93 kg 96.3 kg    Telemetry    NSR with occasional PVCs - Personally Reviewed  ECG    NSR, poor R wave progression with likely nonacute anteroseptal infarct - Personally Reviewed  Physical Exam   GEN: No acute distress.   Neck: supple, JVD to mid neck at 90 degrees Cardiac: regular S1 and S2, 1/6 holosystolic murmurs, no rubs or gallops.  Respiratory: diminished breath sounds at bilateral bases with crackles GI: Soft, nontender, mildly distended. Bowel sounds normal MS: 1+ pitting BL LE edema; No deformity. Neuro:  Nonfocal, moves all limbs independently Psych: Normal affect   Labs    Chemistry Recent Labs  Lab 08/15/18 1527 08/16/18 0418  NA 135  138  K 4.0 3.3*  CL 98 101  CO2 25 25  GLUCOSE 205* 141*  BUN 35* 33*  CREATININE 2.59* 2.11*  CALCIUM 8.4* 8.5*  PROT 6.1* 6.0*  ALBUMIN 3.2* 3.2*  AST 25 25  ALT 21 19  ALKPHOS 44 44  BILITOT 1.2 0.9  GFRNONAA 25* 31*  GFRAA 28* 36*  ANIONGAP 12 12     Hematology Recent Labs  Lab 08/15/18 1527 08/16/18 0418  WBC 9.1 7.7  RBC 4.69 4.95  HGB 13.5 14.0  HCT 40.2 41.5  MCV 85.7 83.8  MCH 28.8 28.3  MCHC 33.6 33.7  RDW 13.9 13.5  PLT 198 193    Cardiac Enzymes Recent Labs  Lab 08/15/18 2218 08/16/18 0418  TROPONINI 1.86* 1.96*    Recent Labs  Lab 08/15/18 1845  TROPIPOC 0.88*     BNP Recent Labs  Lab 08/15/18 1649  BNP 930.7*     DDimer No results for input(s): DDIMER in the last 168 hours.   Radiology      Ct Abdomen Pelvis Wo Contrast  Result Date: 08/15/2018 CLINICAL DATA:  Abdominal pain and increasing distension 1 episode of vomiting EXAM: CT ABDOMEN AND PELVIS WITHOUT CONTRAST TECHNIQUE: Multidetector CT imaging of the abdomen and pelvis was performed following the standard protocol without IV contrast. COMPARISON:  None. FINDINGS: Lower chest: Small moderate right pleural effusion. Small left pleural effusion. Partial atelectasis versus pneumonia in the right greater than left lung bases. Coronary vascular calcification. Heart size upper normal. No large pericardial effusion. Hepatobiliary: No focal liver abnormality is seen. No gallstones, gallbladder wall thickening, or biliary dilatation. Pancreas: Unremarkable. No pancreatic ductal dilatation or surrounding inflammatory changes. Spleen: Normal in size without focal abnormality. Adrenals/Urinary Tract: Adrenal glands are unremarkable. Kidneys are normal, without renal calculi, focal lesion, or hydronephrosis. Bladder is unremarkable. Stomach/Bowel: Stomach is within normal limits. Appendix appears normal. No evidence of bowel wall thickening, distention, or inflammatory changes. Vascular/Lymphatic: Mild aortic atherosclerosis. No aneurysm. No significantly enlarged lymph nodes Reproductive: Slightly enlarged prostate Other: Fat within the inguinal canals. No free air. No significant ascites. Musculoskeletal: Degenerative changes. No acute or suspicious abnormality. IMPRESSION: 1. No CT evidence for acute intra-abdominal or pelvic abnormality. 2. Small to moderate right pleural effusion and small left pleural effusion. Partial atelectasis versus pneumonia in the bilateral lung bases. Electronically Signed   By: Jasmine Pang M.D.   On: 08/15/2018 19:22   Dg Chest 2 View  Result Date: 08/15/2018 CLINICAL DATA:  Orthopnea.  Clinical concern for CHF. EXAM: CHEST - 2 VIEW COMPARISON:  None. FINDINGS: The cardiac silhouette is enlarged. Mixed pattern  pulmonary edema with bilateral moderate in size pleural effusions. Osseous structures are without acute abnormality. Soft tissues are grossly normal. IMPRESSION: Enlarged cardiac silhouette. Mixed pattern pulmonary edema with bilateral moderate in size pleural effusions. Electronically Signed   By: Ted Mcalpine M.D.   On: 08/15/2018 17:50    Cardiac Studies   TTE pending  Patient Profile     62 y.o. male with history of CAD s/p stent 20 years ago (unknown location), diabetes presented with ~10 days of abdominal bloating and pain, associated with PND, orthopnea, dyspnea on exertion, LE edema, and cough. Troponin elevated, ECG concerning for prior anteroseptal infarct. Suspected new AKI as well. Picture concerning for acute systolic heart failure, likely with recent MI that manifested as abdominal pain. No active pain now, patient feeling improved after diuresis  Assessment & Plan    NSTEMI, likely acute systolic  heart failure -continue heparin drip -ok to eat today. Would like to get kidney function improved prior to cath, but [facility] once he is able to lie flat and stable from a renal perspective -changed aspirin from 325 mg to 81 mg daily -stopped verapamil, may need to uptitrate carvedilol -holding ARB given AKI -continue atorvastatin. LFTs nl -continue diuresis, lasix naive and good output on 80 mg IV, change to 40 mg IV BID. Cr improving with diuresis (2.59->2.11). Prior baseline is from 11 years ago, was 1.05. Goal 1-2 L negative, would check PM BMP while diuresing -need daily weights, accurate I/O -repleted K, keep >4 -TSH nl, CBC nl -BNP 930 -UA with protein -added on lipids and Magnesium -TTE pending   Time Spent Directly with Patient: I have spent a total of >35 minutes with the patient reviewing hospital notes, telemetry, EKGs, labs and examining the patient as well as establishing an assessment and plan that was discussed personally with  the patient.  > 50% of time was spent in direct patient care.  Length of Stay:  LOS: 1 day   Jodelle RedBridgette Donold Marotto, MD, PhD [facility]  [facility]   08/16/2018, 8:48 AM      For questions or updates, please contact [facility] Please consult [email or web address] for contact info under Cardiology/STEMI.

## 2018-08-16 NOTE — Progress Notes (Signed)
ANTICOAGULATION CONSULT NOTE   Pharmacy Consult for Heparin Indication: chest pain/ACS  No Known Allergies  Patient Measurements: Height: 5\' 9"  (175.3 cm) Weight: 212 lb 4.8 oz (96.3 kg)(Scale A ) IBW/kg (Calculated) : 70.7 Heparin Dosing Weight: 89.8 kg  Vital Signs: Temp: 99.5 F (37.5 C) (08/19 0509) Temp Source: Oral (08/19 0509) BP: 131/98 (08/19 0509) Pulse Rate: 98 (08/19 0509)  Labs: Recent Labs    08/15/18 1527 08/15/18 2218 08/16/18 0418  HGB 13.5  --  14.0  HCT 40.2  --  41.5  PLT 198  --  193  HEPARINUNFRC  --   --  <0.10*  CREATININE 2.59*  --   --   TROPONINI  --  1.86*  --     Estimated Creatinine Clearance: 33 mL/min (A) (by C-G formula based on SCr of 2.59 mg/dL (H)).   Medical History: Past Medical History:  Diagnosis Date  . Diabetes mellitus without complication (HCC)   . Hypertension    Assessment: [age] yom presents with 9 days of abdominal pain and increasing distension. EKG today showing anterior infarct (old) and minimal ST depression (inferior leads).  No anticoagulation PTA.  Hgb 13.5, plt 198. Troponin 0.88. Scr 2.59.   8/19 AM update: heparin level undetectable, no issues per RN, CBC stable  Goal of Therapy:  Heparin level 0.3-0.7 units/ml Monitor platelets by anticoagulation protocol: Yes   Plan:  Heparin 3000 units [employer] heparin to 1200 units/hr 1400 HL  Abran DukeJames Sally-Anne Wamble, PharmD, BCPS Clinical Pharmacist Phone: [phone]

## 2018-08-16 NOTE — Progress Notes (Signed)
ANTICOAGULATION CONSULT NOTE  Pharmacy Consult:  Heparin Indication: chest pain/ACS  No Known Allergies  Patient Measurements: Height: 5\' 9"  (175.3 cm) Weight: 212 lb 4.8 oz (96.3 kg)(Scale A ) IBW/kg (Calculated) : 70.7 Heparin Dosing Weight: 90 kg  Vital Signs: Temp: 98.5 F (36.9 C) (08/19 2013) Temp Source: Oral (08/19 2013) BP: 114/84 (08/19 2013) Pulse Rate: 85 (08/19 2013)  Labs: Recent Labs    08/15/18 1527 08/15/18 2218 08/16/18 0418 08/16/18 1004 08/16/18 1414 08/16/18 2148  HGB 13.5  --  14.0  --   --   --   HCT 40.2  --  41.5  --   --   --   PLT 198  --  193  --   --   --   HEPARINUNFRC  --   --  <0.10*  --  0.16* 0.46  CREATININE 2.59*  --  2.11*  --   --   --   TROPONINI  --  1.86* 1.96* 1.95*  --   --     Estimated Creatinine Clearance: 40.5 mL/min (A) (by C-G formula based on SCr of 2.11 mg/dL (H)).  Assessment: 62 yo male with ACS for heparin  Goal of Therapy:  Heparin level 0.3-0.7 units/ml Monitor platelets by anticoagulation protocol: Yes    Plan:  Continue Heparin at current rate  Geannie RisenGreg Raynaldo Falco, PharmD, BCPS  08/16/2018, 11:19 PM

## 2018-08-16 NOTE — Progress Notes (Signed)
Heparin bolus of 2500 units administered via smart pump then rate changed to 14.5 ml/hr via pump.

## 2018-08-17 ENCOUNTER — Other Ambulatory Visit (HOSPITAL_COMMUNITY): Payer: BLUE CROSS/BLUE SHIELD

## 2018-08-17 DIAGNOSIS — I214 Non-ST elevation (NSTEMI) myocardial infarction: Secondary | ICD-10-CM | POA: Diagnosis present

## 2018-08-17 DIAGNOSIS — E8770 Fluid overload, unspecified: Secondary | ICD-10-CM | POA: Diagnosis present

## 2018-08-17 LAB — BASIC METABOLIC PANEL
ANION GAP: 7 (ref 5–15)
BUN: 28 mg/dL — AB (ref 8–23)
CALCIUM: 7.9 mg/dL — AB (ref 8.9–10.3)
CO2: 24 mmol/L (ref 22–32)
Chloride: 106 mmol/L (ref 98–111)
Creatinine, Ser: 1.92 mg/dL — ABNORMAL HIGH (ref 0.61–1.24)
GFR calc Af Amer: 41 mL/min — ABNORMAL LOW (ref >60)
GFR calc non Af Amer: 35 mL/min — ABNORMAL LOW (ref >60)
Glucose, Bld: 171 mg/dL — ABNORMAL HIGH (ref 70–99)
POTASSIUM: 3.5 mmol/L (ref 3.5–5.1)
SODIUM: 137 mmol/L (ref 135–145)

## 2018-08-17 LAB — CBC
HCT: 39 % (ref 39.0–52.0)
Hemoglobin: 12.8 g/dL — ABNORMAL LOW (ref 13.0–17.0)
MCH: 27.9 pg (ref 26.0–34.0)
MCHC: 32.8 g/dL (ref 30.0–36.0)
MCV: 85 fL (ref 78.0–100.0)
PLATELETS: 190 10*3/uL (ref 150–400)
RBC: 4.59 MIL/uL (ref 4.22–5.81)
RDW: 13.8 % (ref 11.5–15.5)
WBC: 7.1 10*3/uL (ref 4.0–10.5)

## 2018-08-17 LAB — GLUCOSE, CAPILLARY
GLUCOSE-CAPILLARY: 136 mg/dL — AB (ref 70–99)
GLUCOSE-CAPILLARY: 195 mg/dL — AB (ref 70–99)
Glucose-Capillary: 224 mg/dL — ABNORMAL HIGH (ref 70–99)
Glucose-Capillary: 190 mg/dL — ABNORMAL HIGH (ref 70–99)

## 2018-08-17 LAB — HEPARIN LEVEL (UNFRACTIONATED): Heparin Unfractionated: 0.38 IU/mL (ref 0.30–0.70)

## 2018-08-17 NOTE — Progress Notes (Signed)
ANTICOAGULATION CONSULT NOTE   Pharmacy Consult:  Heparin Indication: chest pain/ACS  No Known Allergies  Patient Measurements: Height: 5\' 9"  (175.3 cm) Weight: 209 lb 1.6 oz (94.8 kg) IBW/kg (Calculated) : 70.7 Heparin Dosing Weight: 90 kg  Vital Signs: Temp: 98 F (36.7 C) (08/20 0440) Temp Source: Oral (08/20 0440) BP: 106/77 (08/20 0440) Pulse Rate: 96 (08/20 0440)  Labs: Recent Labs    08/15/18 1527 08/15/18 2218  08/16/18 0418 08/16/18 1004 08/16/18 1414 08/16/18 2148 08/17/18 0431  HGB 13.5  --   --  14.0  --   --   --  12.8*  HCT 40.2  --   --  41.5  --   --   --  39.0  PLT 198  --   --  193  --   --   --  190  HEPARINUNFRC  --   --    < > <0.10*  --  0.16* 0.46 0.38  CREATININE 2.59*  --   --  2.11*  --   --   --  1.92*  TROPONINI  --  1.86*  --  1.96* 1.95*  --   --   --    < > = values in this interval not displayed.    Estimated Creatinine Clearance: 44.1 mL/min (A) (by C-G formula based on SCr of 1.92 mg/dL (H)).   Assessment: [age] YOM presents with 9 days of abdominal pain and increasing distension. EKG showing anterior infarct (old) and minimal ST depression (inferior leads).  Pharmacy has been consulted to dose IV heparin for ACS.  Heparin level is therapeutic; no bleeding reported.   Goal of Therapy:  Heparin level 0.3-0.7 units/ml Monitor platelets by anticoagulation protocol: Yes    Plan:  Increase heparin gtt slightly to 1500 units/hr Daily heparin level and CBC   Karliah Kowalchuk D. Laney Potashang, PharmD, BCPS, BCCCP 08/17/2018, 7:43 AM

## 2018-08-17 NOTE — Progress Notes (Signed)
PROGRESS NOTE    Jose Rasmussenommy L Mira  ZOX:[id number] DOB: 11-15-1956 DOA: 08/15/2018 PCP: Daisy Florooss, Charles Alan, MD    Brief Narrative: Jose Myers is a 62 y.o. male with history of CAD status post stenting, diabetes mellitus type 2, hypertension has been experiencing abdominal pain over the last 1 week.  Patient states his abdominal pain started after he ate supper trip a week ago.  At that time he also had vomited once.  Denies any diarrhea.  Denies any chest pain.  Has been noticing increasing shortness of breath and lower extremity edema last 24 hours.  ED Course: In the ER on exam patient has significant bilateral lower extremity edema chest x-ray showed bilateral pleural effusion.  BNP is 930.  Troponin is mildly elevated 0.8.  EKG shows old anterior infarct with ST depression in inferior leads.  Cardiology was consulted patient was given Lasix and started on heparin.  CT abdomen pelvis and LFTs are unremarkable.  Abdomen is mildly tender.  Patient admitted with Non STEMI and heart failure exacerbation.   Assessment & Plan:   Active Problems:   Acute CHF (congestive heart failure) (HCC)   Acute renal failure (ARF) (HCC)   Elevated troponin   Abdominal pain   Type 2 diabetes mellitus with renal manifestations (HCC)   1-Acute Heart failure; presume systolic , await ECHO results.  Appreciate Cardiology evaluation.  Continue with lasix.  Monitor renal function.  Await ECHO.  Negative 3 L. Urine out put on 8-19; 2.4 L.    2-Non STEMI;  On heparin Gtt.  Cardiology planning cath this admission, when renal function allows it.  Denies chest pain, on heparin gtt.  3-Acute renal failure;  unclear baseline. Cr on admission 2.5---1.9 Request records from PCP> awaiting fax.  Monitor on lasix.  Improved, cr today at 1.9.   DM;  SSI  HTN;  Hold cozaar due to AKI.  On BB Verapamil discontinue by cardiology/     DVT prophylaxis: heparin  Code Status: full code.  Family  Communication: wife at bedside.  Disposition Plan: remain inpatient for treatment of NON STEMI  Consultants:   Cardiology    Procedures:   ECHO ordered.    Antimicrobials:   none   Subjective: He is breathing better, not at baseline.  Abdominal distension pain improving.  Lower extremity edema improving.    Objective: Vitals:   08/16/18 1242 08/16/18 2013 08/17/18 0440 08/17/18 0829  BP: 111/83 114/84 106/77 (!) 129/94  Pulse: 90 85 96 91  Resp:  18 20   Temp: 98.4 F (36.9 C) 98.5 F (36.9 C) 98 F (36.7 C)   TempSrc: Oral Oral Oral   SpO2: 96% 95% 96%   Weight:   94.8 kg   Height:        Intake/Output Summary (Last 24 hours) at 08/17/2018 1143 Last data filed at 08/17/2018 0956 Gross per 24 hour  Intake 709.39 ml  Output 3000 ml  Net -2290.61 ml   Filed Weights   08/15/18 1519 08/16/18 0500 08/17/18 0440  Weight: 93 kg 96.3 kg 94.8 kg    Examination:  General exam: NAD Respiratory system: crackles bases.  Cardiovascular system: S 1, S 2 RRR Gastrointestinal system: BS present, soft, nt Central nervous system: Non focal.  Extremities: Symmetric power.  Skin: No rash    Data Reviewed: I have personally reviewed following labs and imaging studies  CBC: Recent Labs  Lab 08/15/18 1527 08/16/18 0418 08/17/18 0431  WBC 9.1 7.7 7.1  HGB 13.5 14.0 12.8*  HCT 40.2 41.5 39.0  MCV 85.7 83.8 85.0  PLT 198 193 190   Basic Metabolic Panel: Recent Labs  Lab 08/15/18 1527 08/15/18 2218 08/16/18 0418 08/16/18 1004 08/17/18 0431  NA 135  --  138  --  137  K 4.0  --  3.3*  --  3.5  CL 98  --  101  --  106  CO2 25  --  25  --  24  GLUCOSE 205*  --  141*  --  171*  BUN 35*  --  33*  --  28*  CREATININE 2.59*  --  2.11*  --  1.92*  CALCIUM 8.4*  --  8.5*  --  7.9*  MG  --  2.4  --  2.4  --    GFR: Estimated Creatinine Clearance: 44.1 mL/min (A) (by C-G formula based on SCr of 1.92 mg/dL (H)). Liver Function Tests: Recent Labs  Lab  08/15/18 1527 08/16/18 0418  AST 25 25  ALT 21 19  ALKPHOS 44 44  BILITOT 1.2 0.9  PROT 6.1* 6.0*  ALBUMIN 3.2* 3.2*   Recent Labs  Lab 08/15/18 1527  LIPASE 31   No results for input(s): AMMONIA in the last 168 hours. Coagulation Profile: No results for input(s): INR, PROTIME in the last 168 hours. Cardiac Enzymes: Recent Labs  Lab 08/15/18 2218 08/16/18 0418 08/16/18 1004  TROPONINI 1.86* 1.96* 1.95*   BNP (last 3 results) No results for input(s): PROBNP in the last 8760 hours. HbA1C: No results for input(s): HGBA1C in the last 72 hours. CBG: Recent Labs  Lab 08/16/18 1000 08/16/18 1200 08/16/18 1723 08/16/18 2126 08/17/18 0746  GLUCAP 129* 232* 132* 193* 136*   Lipid Profile: Recent Labs    08/16/18 1004  CHOL 141  HDL 25*  LDLCALC 83  TRIG 161*  CHOLHDL 5.6   Thyroid Function Tests: Recent Labs    08/15/18 2218  TSH 0.907   Anemia Panel: No results for input(s): VITAMINB12, FOLATE, FERRITIN, TIBC, IRON, RETICCTPCT in the last 72 hours. Sepsis Labs: No results for input(s): PROCALCITON, LATICACIDVEN in the last 168 hours.  No results found for this or any previous visit (from the past 240 hour(s)).       Radiology Studies: Ct Abdomen Pelvis Wo Contrast  Result Date: 08/15/2018 CLINICAL DATA:  Abdominal pain and increasing distension 1 episode of vomiting EXAM: CT ABDOMEN AND PELVIS WITHOUT CONTRAST TECHNIQUE: Multidetector CT imaging of the abdomen and pelvis was performed following the standard protocol without IV contrast. COMPARISON:  None. FINDINGS: Lower chest: Small moderate right pleural effusion. Small left pleural effusion. Partial atelectasis versus pneumonia in the right greater than left lung bases. Coronary vascular calcification. Heart size upper normal. No large pericardial effusion. Hepatobiliary: No focal liver abnormality is seen. No gallstones, gallbladder wall thickening, or biliary dilatation. Pancreas: Unremarkable. No  pancreatic ductal dilatation or surrounding inflammatory changes. Spleen: Normal in size without focal abnormality. Adrenals/Urinary Tract: Adrenal glands are unremarkable. Kidneys are normal, without renal calculi, focal lesion, or hydronephrosis. Bladder is unremarkable. Stomach/Bowel: Stomach is within normal limits. Appendix appears normal. No evidence of bowel wall thickening, distention, or inflammatory changes. Vascular/Lymphatic: Mild aortic atherosclerosis. No aneurysm. No significantly enlarged lymph nodes Reproductive: Slightly enlarged prostate Other: Fat within the inguinal canals. No free air. No significant ascites. Musculoskeletal: Degenerative changes. No acute or suspicious abnormality. IMPRESSION: 1. No CT evidence for acute intra-abdominal or pelvic abnormality. 2. Small to moderate right pleural effusion  and small left pleural effusion. Partial atelectasis versus pneumonia in the bilateral lung bases. Electronically Signed   By: Jasmine PangKim  Fujinaga M.D.   On: 08/15/2018 19:22   Dg Chest 2 View  Result Date: 08/15/2018 CLINICAL DATA:  Orthopnea.  Clinical concern for CHF. EXAM: CHEST - 2 VIEW COMPARISON:  None. FINDINGS: The cardiac silhouette is enlarged. Mixed pattern pulmonary edema with bilateral moderate in size pleural effusions. Osseous structures are without acute abnormality. Soft tissues are grossly normal. IMPRESSION: Enlarged cardiac silhouette. Mixed pattern pulmonary edema with bilateral moderate in size pleural effusions. Electronically Signed   By: Ted Mcalpineobrinka  Dimitrova M.D.   On: 08/15/2018 17:50        Scheduled Meds: . aspirin EC  81 mg Oral Daily  . atorvastatin  40 mg Oral q1800  . carvedilol  6.25 mg Oral BID WC  . clomiPHENE  25 mg Oral Daily  . furosemide  40 mg Intravenous BID  . insulin aspart  0-9 Units Subcutaneous TID WC  . omega-3 acid ethyl esters  1 g Oral QHS  . sertraline  25 mg Oral Daily   Continuous Infusions: . heparin 1,500 Units/hr (08/17/18  0815)     LOS: 2 days    Time spent: 35 minutes.     Alba CoryBelkys A Costa Jha, MD [facility] Pager [phone]  If 7PM-7AM, please contact night-coverage [email or web address] Password [email or web address] 08/17/2018, 11:43 AM

## 2018-08-17 NOTE — Progress Notes (Signed)
Progress Note  Patient Name: Jose Myers Date of Encounter: 08/17/2018  Primary Cardiologist: No primary care provider on file.   Subjective   Patient feeling much better today. Feels that he can nearly lie flat. Leg swelling much improved. No chest pain.   Inpatient Medications    Scheduled Meds: . aspirin EC  81 mg Oral Daily  . atorvastatin  40 mg Oral q1800  . carvedilol  6.25 mg Oral BID WC  . clomiPHENE  25 mg Oral Daily  . furosemide  40 mg Intravenous BID  . insulin aspart  0-9 Units Subcutaneous TID WC  . omega-3 acid ethyl esters  1 g Oral QHS  . sertraline  25 mg Oral Daily   Continuous Infusions: . heparin 1,500 Units/hr (08/17/18 1203)   PRN Meds: acetaminophen **OR** acetaminophen, LORazepam, ondansetron **OR** ondansetron (ZOFRAN) IV, traZODone   Vital Signs    Vitals:   08/17/18 0440 08/17/18 0829 08/17/18 1329 08/17/18 1643  BP: 106/77 (!) 129/94 116/87 (!) 131/96  Pulse: 96 91 83 88  Resp: 20  18 20   Temp: 98 F (36.7 C)  98.5 F (36.9 C) 98.3 F (36.8 C)  TempSrc: Oral  Oral Oral  SpO2: 96%  97% 97%  Weight: 94.8 kg     Height:        Intake/Output Summary (Last 24 hours) at 08/17/2018 1813 Last data filed at 08/17/2018 1500 Gross per 24 hour  Intake 739.49 ml  Output 2475 ml  Net -1735.51 ml   Filed Weights   08/15/18 1519 08/16/18 0500 08/17/18 0440  Weight: 93 kg 96.3 kg 94.8 kg    Telemetry    NSR with occasional PVCs - Personally Reviewed  ECG    NSR, poor R wave progression with likely nonacute anteroseptal infarct - Personally Reviewed  Physical Exam   GEN: No acute distress.   Neck: supple, JVD barely visible at 90 degrees Cardiac: regular S1 and S2, 1/6 holosystolic murmurs, no rubs or gallops.  Respiratory: faint crackles at the bases, improved from yesterday GI: Soft, nontender, mildly distended. Bowel sounds normal MS: trace-1+ pitting BL LE edema; No deformity. Neuro:  Nonfocal, moves all limbs  independently Psych: Normal affect   Labs    Chemistry Recent Labs  Lab 08/15/18 1527 08/16/18 0418 08/17/18 0431  NA 135 138 137  K 4.0 3.3* 3.5  CL 98 101 106  CO2 25 25 24   GLUCOSE 205* 141* 171*  BUN 35* 33* 28*  CREATININE 2.59* 2.11* 1.92*  CALCIUM 8.4* 8.5* 7.9*  PROT 6.1* 6.0*  --   ALBUMIN 3.2* 3.2*  --   AST 25 25  --   ALT 21 19  --   ALKPHOS 44 44  --   BILITOT 1.2 0.9  --   GFRNONAA 25* 31* 35*  GFRAA 28* 36* 41*  ANIONGAP 12 12 7      Hematology Recent Labs  Lab 08/15/18 1527 08/16/18 0418 08/17/18 0431  WBC 9.1 7.7 7.1  RBC 4.69 4.95 4.59  HGB 13.5 14.0 12.8*  HCT 40.2 41.5 39.0  MCV 85.7 83.8 85.0  MCH 28.8 28.3 27.9  MCHC 33.6 33.7 32.8  RDW 13.9 13.5 13.8  PLT 198 193 190    Cardiac Enzymes Recent Labs  Lab 08/15/18 2218 08/16/18 0418 08/16/18 1004  TROPONINI 1.86* 1.96* 1.95*    Recent Labs  Lab 08/15/18 1845  TROPIPOC 0.88*     BNP Recent Labs  Lab 08/15/18 1649  BNP 930.7*  DDimer No results for input(s): DDIMER in the last 168 hours.   Radiology    Ct Abdomen Pelvis Wo Contrast  Result Date: 08/15/2018 CLINICAL DATA:  Abdominal pain and increasing distension 1 episode of vomiting EXAM: CT ABDOMEN AND PELVIS WITHOUT CONTRAST TECHNIQUE: Multidetector CT imaging of the abdomen and pelvis was performed following the standard protocol without IV contrast. COMPARISON:  None. FINDINGS: Lower chest: Small moderate right pleural effusion. Small left pleural effusion. Partial atelectasis versus pneumonia in the right greater than left lung bases. Coronary vascular calcification. Heart size upper normal. No large pericardial effusion. Hepatobiliary: No focal liver abnormality is seen. No gallstones, gallbladder wall thickening, or biliary dilatation. Pancreas: Unremarkable. No pancreatic ductal dilatation or surrounding inflammatory changes. Spleen: Normal in size without focal abnormality. Adrenals/Urinary Tract: Adrenal glands  are unremarkable. Kidneys are normal, without renal calculi, focal lesion, or hydronephrosis. Bladder is unremarkable. Stomach/Bowel: Stomach is within normal limits. Appendix appears normal. No evidence of bowel wall thickening, distention, or inflammatory changes. Vascular/Lymphatic: Mild aortic atherosclerosis. No aneurysm. No significantly enlarged lymph nodes Reproductive: Slightly enlarged prostate Other: Fat within the inguinal canals. No free air. No significant ascites. Musculoskeletal: Degenerative changes. No acute or suspicious abnormality. IMPRESSION: 1. No CT evidence for acute intra-abdominal or pelvic abnormality. 2. Small to moderate right pleural effusion and small left pleural effusion. Partial atelectasis versus pneumonia in the bilateral lung bases. Electronically Signed   By: Jasmine PangKim  Fujinaga M.D.   On: 08/15/2018 19:22    Cardiac Studies   TTE pending  Patient Profile     62 y.o. male with history of CAD s/p stent 20 years ago (unknown location), diabetes presented with ~10 days of abdominal bloating and pain, associated with PND, orthopnea, dyspnea on exertion, LE edema, and cough. Troponin elevated, ECG concerning for prior anteroseptal infarct. Suspected new AKI as well. Picture concerning for acute systolic heart failure, likely with recent MI that manifested as abdominal pain. No active pain now, patient feeling improved after diuresis  Assessment & Plan    NSTEMI, likely acute systolic heart failure. Suspect progression of CAD may be culprit -continue heparin drip. Ok to hold drip if patient wants to shower -renal function improving with diuresis. Would ideally like Cr to be closer to 1.5 prior to cath, but will tentatively consider Thursday as timing for procedure. TBD based on renal function trend -aspirin 81 mg daily -holding ARB given AKI -continue carvedilol -continue atorvastatin. LFTs nl -need daily weights, accurate I/O -repleted K, keep >4 -TSH nl, CBC  nl -BNP 930 -UA with protein -TTE pending   Time Spent Directly with Patient: I have spent a total of 25 minutes with the patient reviewing hospital notes, telemetry, EKGs, labs and examining the patient as well as establishing an assessment and plan that was discussed personally with the patient.  > 50% of time was spent in direct patient care.  Length of Stay:  LOS: 2 days   Jodelle RedBridgette Dru Primeau, MD, PhD [facility]  [facility]   08/17/2018, 6:13 PM      For questions or updates, please contact [facility] Please consult [email or web address] for contact info under Cardiology/STEMI.

## 2018-08-17 NOTE — Plan of Care (Signed)
  Problem: Health Behavior/Discharge Planning: Goal: Ability to manage health-related needs will improve Outcome: Progressing   Problem: Education: Goal: Ability to demonstrate management of disease process will improve Outcome: Progressing   Problem: Education: Goal: Ability to verbalize understanding of medication therapies will improve Outcome: Progressing   

## 2018-08-18 ENCOUNTER — Inpatient Hospital Stay (HOSPITAL_COMMUNITY): Payer: BLUE CROSS/BLUE SHIELD

## 2018-08-18 DIAGNOSIS — I361 Nonrheumatic tricuspid (valve) insufficiency: Secondary | ICD-10-CM

## 2018-08-18 DIAGNOSIS — I5021 Acute systolic (congestive) heart failure: Secondary | ICD-10-CM

## 2018-08-18 DIAGNOSIS — I340 Nonrheumatic mitral (valve) insufficiency: Secondary | ICD-10-CM

## 2018-08-18 DIAGNOSIS — I236 Thrombosis of atrium, auricular appendage, and ventricle as current complications following acute myocardial infarction: Secondary | ICD-10-CM

## 2018-08-18 LAB — BASIC METABOLIC PANEL
Anion gap: 12 (ref 5–15)
BUN: 23 mg/dL (ref 8–23)
CO2: 22 mmol/L (ref 22–32)
Calcium: 8.4 mg/dL — ABNORMAL LOW (ref 8.9–10.3)
Chloride: 104 mmol/L (ref 98–111)
Creatinine, Ser: 1.78 mg/dL — ABNORMAL HIGH (ref 0.61–1.24)
GFR calc Af Amer: 45 mL/min — ABNORMAL LOW (ref >60)
GFR calc non Af Amer: 39 mL/min — ABNORMAL LOW (ref >60)
GLUCOSE: 245 mg/dL — AB (ref 70–99)
POTASSIUM: 3.7 mmol/L (ref 3.5–5.1)
Sodium: 138 mmol/L (ref 135–145)

## 2018-08-18 LAB — GLUCOSE, CAPILLARY
GLUCOSE-CAPILLARY: 259 mg/dL — AB (ref 70–99)
Glucose-Capillary: 160 mg/dL — ABNORMAL HIGH (ref 70–99)
Glucose-Capillary: 183 mg/dL — ABNORMAL HIGH (ref 70–99)
Glucose-Capillary: 161 mg/dL — ABNORMAL HIGH (ref 70–99)

## 2018-08-18 LAB — ECHOCARDIOGRAM COMPLETE
HEIGHTINCHES: 69 in
WEIGHTICAEL: 3316.8 [oz_av]

## 2018-08-18 LAB — HEPARIN LEVEL (UNFRACTIONATED): Heparin Unfractionated: 0.39 IU/mL (ref 0.30–0.70)

## 2018-08-18 LAB — CBC
HEMATOCRIT: 42.6 % (ref 39.0–52.0)
Hemoglobin: 14.1 g/dL (ref 13.0–17.0)
MCH: 28.3 pg (ref 26.0–34.0)
MCHC: 33.1 g/dL (ref 30.0–36.0)
MCV: 85.5 fL (ref 78.0–100.0)
Platelets: 222 10*3/uL (ref 150–400)
RBC: 4.98 MIL/uL (ref 4.22–5.81)
RDW: 13.8 % (ref 11.5–15.5)
WBC: 9.4 10*3/uL (ref 4.0–10.5)

## 2018-08-18 MED ORDER — ASPIRIN 81 MG PO CHEW
81.0000 mg | CHEWABLE_TABLET | ORAL | Status: AC
  Administered 2018-08-19: 81 mg via ORAL
  Filled 2018-08-18: qty 1

## 2018-08-18 MED ORDER — SODIUM CHLORIDE 0.9% FLUSH: 3.0000 mL | INTRAVENOUS | Status: DC | PRN

## 2018-08-18 MED ORDER — SODIUM CHLORIDE 0.9 % IV SOLN: 250.0000 mL | INTRAVENOUS | Status: DC | PRN

## 2018-08-18 MED ORDER — SODIUM CHLORIDE 0.9% FLUSH
3.0000 mL | Freq: Two times a day (BID) | INTRAVENOUS | Status: DC
  Administered 2018-08-19: 3 mL via INTRAVENOUS

## 2018-08-18 MED ORDER — ASPIRIN EC 81 MG PO TBEC
81.0000 mg | DELAYED_RELEASE_TABLET | Freq: Every day | ORAL | Status: DC
  Administered 2018-08-20 – 2018-09-06 (×18): 81 mg via ORAL
  Filled 2018-08-18 (×19): qty 1

## 2018-08-18 MED ORDER — SODIUM CHLORIDE 0.9 % IV SOLN
INTRAVENOUS | Status: DC
  Administered 2018-08-19: 05:00:00 via INTRAVENOUS

## 2018-08-18 NOTE — Progress Notes (Addendum)
Inpatient Diabetes Program Recommendations  AACE/ADA: New Consensus Statement on Inpatient Glycemic Control (2015)  Target Ranges:  Prepandial:   less than 140 mg/dL      Peak postprandial:   less than 180 mg/dL (1-2 hours)      Critically ill patients:  140 - 180 mg/dL   Lab Results  Component Value Date   GLUCAP 160 (H) 08/18/2018    Review of Glycemic Control Results for Jose RasmussenSCONTRIAS, Jose Myers (MRN [id number]) as of 08/18/2018 12:20  Ref. Range 08/17/2018 07:46 08/17/2018 13:23 08/17/2018 16:40 08/17/2018 21:17 08/18/2018 07:40  Glucose-Capillary Latest Ref Range: 70 - 99 mg/dL 829136 (H) 562224 (H) 130190 (H) 195 (H) 160 (H)   Diabetes history: Type 2 DM  Outpatient Diabetes medications: Amaryl 4 mg bid, Metformin 500 mg tid Current orders for Inpatient glycemic control:  Novolog sensitive tid with meals Inpatient Diabetes Program Recommendations:   Consider adding Novolog 3 units tid with meals to cover meal intake while oral agents are held in the hospital. Also please consider checking A1C while in the hospital. Text page sent.   Thanks,  Jose MeagerJenny Kaeleigh Westendorf, RN, BC-ADM Inpatient Diabetes Coordinator Pager [phone] (8a-5p)

## 2018-08-18 NOTE — H&P (View-Only) (Signed)
Progress Note  Patient Name: Jose Myers Date of Encounter: 08/18/2018  Primary Cardiologist: No primary care provider on file.   Subjective   Patient feeling well today other than cough when he lies down. Echo being performed when I was present. He is amenable to cath tomorrow (see below)  Inpatient Medications    Scheduled Meds: . aspirin EC  81 mg Oral Daily  . atorvastatin  40 mg Oral q1800  . carvedilol  6.25 mg Oral BID WC  . clomiPHENE  25 mg Oral Daily  . furosemide  40 mg Intravenous BID  . insulin aspart  0-9 Units Subcutaneous TID WC  . omega-3 acid ethyl esters  1 g Oral QHS  . sertraline  25 mg Oral Daily   Continuous Infusions: . heparin 1,500 Units/hr (08/17/18 2034)   PRN Meds: acetaminophen **OR** acetaminophen, LORazepam, ondansetron **OR** ondansetron (ZOFRAN) IV, traZODone   Vital Signs    Vitals:   08/17/18 1329 08/17/18 1643 08/17/18 2017 08/18/18 0431  BP: 116/87 (!) 131/96 114/84 (!) 144/97  Pulse: 83 88 81 91  Resp: 18 20 20 18   Temp: 98.5 F (36.9 C) 98.3 F (36.8 C) 98.1 F (36.7 C) 98.1 F (36.7 C)  TempSrc: Oral Oral Oral Oral  SpO2: 97% 97% 96% 96%  Weight:    94 kg  Height:        Intake/Output Summary (Last 24 hours) at 08/18/2018 1046 Last data filed at 08/18/2018 0900 Gross per 24 hour  Intake 1610.11 ml  Output 1980 ml  Net -369.89 ml   Filed Weights   08/16/18 0500 08/17/18 0440 08/18/18 0431  Weight: 96.3 kg 94.8 kg 94 kg    Telemetry    NSR with occasional PVCs - Personally Reviewed  ECG    No new- Personally Reviewed  Physical Exam   GEN: No acute distress.   Neck: supple, JVD barely visible at 90 degrees Cardiac: regular S1 and S2, 1/6 holosystolic murmurs, no rubs or gallops.  Respiratory: faint crackles at the bases GI: Soft, nontender, mildly distended. Bowel sounds normal MS: trace-1+ pitting BL LE edema; No deformity. Neuro:  Nonfocal, moves all limbs independently Psych: Normal affect    Labs    Chemistry Recent Labs  Lab 08/15/18 1527 08/16/18 0418 08/17/18 0431 08/18/18 0914  NA 135 138 137 138  K 4.0 3.3* 3.5 3.7  CL 98 101 106 104  CO2 25 25 24 22   GLUCOSE 205* 141* 171* 245*  BUN 35* 33* 28* 23  CREATININE 2.59* 2.11* 1.92* 1.78*  CALCIUM 8.4* 8.5* 7.9* 8.4*  PROT 6.1* 6.0*  --   --   ALBUMIN 3.2* 3.2*  --   --   AST 25 25  --   --   ALT 21 19  --   --   ALKPHOS 44 44  --   --   BILITOT 1.2 0.9  --   --   GFRNONAA 25* 31* 35* 39*  GFRAA 28* 36* 41* 45*  ANIONGAP 12 12 7 12      Hematology Recent Labs  Lab 08/16/18 0418 08/17/18 0431 08/18/18 0422  WBC 7.7 7.1 9.4  RBC 4.95 4.59 4.98  HGB 14.0 12.8* 14.1  HCT 41.5 39.0 42.6  MCV 83.8 85.0 85.5  MCH 28.3 27.9 28.3  MCHC 33.7 32.8 33.1  RDW 13.5 13.8 13.8  PLT 193 190 222    Cardiac Enzymes Recent Labs  Lab 08/15/18 2218 08/16/18 0418 08/16/18 1004  TROPONINI 1.86*  1.96* 1.95*    Recent Labs  Lab 08/15/18 1845  TROPIPOC 0.88*     BNP Recent Labs  Lab 08/15/18 1649  BNP 930.7*     DDimer No results for input(s): DDIMER in the last 168 hours.   Radiology    No results found.  Cardiac Studies   TTE pending  Patient Profile     62 y.o. male with history of CAD s/p stent 20 years ago (unknown location), diabetes presented with ~10 days of abdominal bloating and pain, associated with PND, orthopnea, dyspnea on exertion, LE edema, and cough. Troponin elevated, ECG concerning for prior anteroseptal infarct. Suspected new AKI as well. Picture concerning for acute systolic heart failure, likely with recent MI that manifested as abdominal pain. No active pain now, patient feeling improved after diuresis  Assessment & Plan    NSTEMI, likely acute systolic heart failure. Suspect progression of CAD may be culprit -continue heparin drip.  -echo is being uploaded. From the bedside images I saw, has significant wall motion abnormalities and depressed EF, with akinetic apex and  large LV thrombus. Suspect he had a large MI subacutely (when he had the abdominal discomfort). Will need anticoagulation for thrombus after cath. -renal function improving with diuresis. Today 1.78 Cr with GFR near 40.  -aspirin 81 mg daily. Holding P2Y12 given possible need for CABG -holding ARB given AKI -continue carvedilol -continue atorvastatin. LFTs nl -daily weights, accurate I/O -repleted K, keep >4 -TSH nl, CBC nl -BNP 930 -UA with protein  Risks and benefits of cardiac catheterization have been discussed with the patient.  These include bleeding, infection, kidney damage, stroke, heart attack, death.  The patient understands these risks and is willing to proceed. We specifically discussed that his renal function has not fully normalized, and he is at an increased risk for contrast induced nephropathy. He understands and wishes to proceed.   Time Spent Directly with Patient: I have spent a total of 25 minutes with the patient reviewing hospital notes, telemetry, EKGs, labs and examining the patient as well as establishing an assessment and plan that was discussed personally with the patient.  > 50% of time was spent in direct patient care.  Length of Stay:  LOS: 3 days   Jemal Miskell, MD, PhD [place]  [facility]   08/18/2018, 10:46 AM      For questions or updates, please contact [facility] Please consult [email or web address] for contact info under Cardiology/STEMI. 

## 2018-08-18 NOTE — Progress Notes (Signed)
ANTICOAGULATION CONSULT NOTE   Pharmacy Consult:  Heparin Indication: chest pain/ACS  No Known Allergies  Patient Measurements: Height: 5\' 9"  (175.3 cm) Weight: 207 lb 4.8 oz (94 kg) IBW/kg (Calculated) : 70.7 Heparin Dosing Weight: 90 kg  Vital Signs: Temp: 98.1 F (36.7 C) (08/21 0431) Temp Source: Oral (08/21 0431) BP: 144/97 (08/21 0431) Pulse Rate: 91 (08/21 0431)  Labs: Recent Labs    08/15/18 1527 08/15/18 2218 08/16/18 0418 08/16/18 1004  08/16/18 2148 08/17/18 0431 08/18/18 0422  HGB 13.5  --  14.0  --   --   --  12.8* 14.1  HCT 40.2  --  41.5  --   --   --  39.0 42.6  PLT 198  --  193  --   --   --  190 222  HEPARINUNFRC  --   --  <0.10*  --    < > 0.46 0.38 0.39  CREATININE 2.59*  --  2.11*  --   --   --  1.92*  --   TROPONINI  --  1.86* 1.96* 1.95*  --   --   --   --    < > = values in this interval not displayed.    Estimated Creatinine Clearance: 44 mL/min (A) (by C-G formula based on SCr of 1.92 mg/dL (H)).   Assessment: 89 YOM presents with 9 days of abdominal pain and increasing distension. EKG showing anterior infarct (old) and minimal ST depression (inferior leads).  Pharmacy has been consulted to dose IV heparin for ACS.  Heparin level is therapeutic and stable; no bleeding reported.   Goal of Therapy:  Heparin level 0.3-0.7 units/ml Monitor platelets by anticoagulation protocol: Yes    Plan:  Continue heparin gtt at 1500 units/hr Daily heparin level and CBC   Eulan Heyward D. Laney Potashang, PharmD, BCPS, BCCCP 08/18/2018, 7:39 AM

## 2018-08-18 NOTE — Plan of Care (Signed)
Nutrition Education Note  RD consulted for nutrition education regarding CHD and DM.    No results found for: HGBA1C PTA DM medications are 4 mg glimeperide BID and 500 ml metformin BID. Pt reports good glycemic control at home; CBGS usually run in the 120's.    Labs reviewed: CBGS: 160-195(inpatient orders for glycemic control are 0-9 units insulin aspart TID with meals).    Spoke with pt and wife at bedside. Pt reports he tries to manage his DM and diet well. Pt usually consumes 3 meals per day and pt wife cooks most meals at home and is conscious about salt intake. Pt reports his biggest barrier is when he goes out to eat, as he loves fried chicken, hamburgers and french fries. Education focused on decreasing sodium in diet, especially eating out. Discussed healthier options when eating out.   RD provided "Heart Healthy, Consistent Carbohydrate Nutrition Therapy" handout from the Academy of [employer]. Reviewed patient's dietary recall. Provided examples on ways to decrease sodium intake in diet. Discouraged intake of processed foods and use of salt shaker. Encouraged fresh fruits and vegetables as well as whole grain sources of carbohydrates to maximize fiber intake.   RD discussed why it is important for patient to adhere to diet recommendations, and emphasized the role of fluids, foods to avoid, and importance of weighing self daily. Teach back method used.  Expect good compliance.  Body mass index is 30.61 kg/m. Pt meets criteria for obesity, class I based on current BMI.  Current diet order is Heart Healthy/ Carb Modified, patient is consuming approximately 100% of meals at this time. Labs and medications reviewed. No further nutrition interventions warranted at this time. RD contact information provided. If additional nutrition issues arise, please re-consult RD.   Jose Myers, RD, LDN, CDE Pager: [phone] After hours Pager: [phone]

## 2018-08-18 NOTE — Progress Notes (Signed)
  Echocardiogram 2D Echocardiogram has been performed.  Gerda Dissrthur L [place] 08/18/2018, 12:14 PM

## 2018-08-18 NOTE — Progress Notes (Signed)
PROGRESS NOTE  Jose Myers DOB: 1956/04/05 DOA: 08/15/2018 PCP: Daisy Florooss, Charles Alan, MD   LOS: 3 days   Brief Narrative / Interim history: 62 year old male with history of coronary artery disease with stents in the past, type 2 diabetes mellitus, hypertension, who presents to the hospital with abdominal pain and nausea, initially thought it was due to food poisoning.  He had no diarrhea.  He denies any chest pain.  He is also been complaining of progressive shortness of breath, and on admission he was found to have fluid overload with bilateral lower extremity edema as well as chest x-ray showing pulmonary edema.  His troponin was found to be elevated cardiology was consulted given concern for NSTEMI.  He was started on heparin and IV Lasix.  Assessment & Plan: Active Problems:   Acute CHF (congestive heart failure) (HCC)   Acute renal failure (ARF) (HCC)   Elevated troponin   Abdominal pain   Type 2 diabetes mellitus with renal manifestations (HCC)   NSTEMI (non-ST elevated myocardial infarction) (HCC)   Hypervolemia   Acute systolic CHF -likely in the stetting of NSTEMI, improved with diuresis -plan for cardiac catheterization tomorrow, discussed with Dr. Cristal Deerhristopher -Continue anticoagulation with heparin -2D echo done today showed an EF of 25 to 30% with diffuse hypokinesis, and a large anterior thrombus associated with an akinetic segment  NSTEMI -cath tomorrow, for how continue heparin gtt  Acute kidney injury -unclear baseline, Cr improving with Lasix   DM -continue SSI  HTN -hold Cozaar due to AKI, continue Coreg   HLD -continue statin    DVT prophylaxis: heparin gtt Code Status: Full code Family Communication: no family at bedside Disposition Plan: home when ready   Consultants:   Cardiology   Procedures:   2D echo:  Study Conclusions - Left ventricle: The cavity size was normal. Wall thickness was increased in a pattern of mild LVH.  Systolic function was severely reduced. The estimated ejection fraction was in the range of 25% to 30%. Diffuse hypokinesis. There is akinesis of the anteroseptal, anterior, and anterolateral myocardium. There was a large, 3.3 cm (L) x 1.8 cm (W), spherical, fixed, apical anteriorthrombusassociated with an akinetic segment. - Aortic valve: Trileaflet; mildly thickened, mildly calcified leaflets. - Mitral valve: There was moderate regurgitation. - Left atrium: The atrium was mildly dilated. - Tricuspid valve: There was mild regurgitation. - Pulmonary arteries: Systolic pressure was moderately increased. PA peak pressure: 59 mm Hg (S).  Antimicrobials:  None    Subjective: -Feeling a lot better, denies any chest pain, denies any shortness of breath.  Able to ambulate without difficulties  Objective: Vitals:   08/17/18 1643 08/17/18 2017 08/18/18 0431 08/18/18 1218  BP: (!) 131/96 114/84 (!) 144/97 127/89  Pulse: 88 81 91 91  Resp: 20 20 18 20   Temp: 98.3 F (36.8 C) 98.1 F (36.7 C) 98.1 F (36.7 C) 98.4 F (36.9 C)  TempSrc: Oral Oral Oral Oral  SpO2: 97% 96% 96% 97%  Weight:   94 kg   Height:        Intake/Output Summary (Last 24 hours) at 08/18/2018 1312 Last data filed at 08/18/2018 1209 Gross per 24 hour  Intake 1224.63 ml  Output 2780 ml  Net -1555.37 ml   Filed Weights   08/16/18 0500 08/17/18 0440 08/18/18 0431  Weight: 96.3 kg 94.8 kg 94 kg    Examination:  Constitutional: NAD Eyes: PERRL, lids and conjunctivae normal ENMT: Mucous membranes are moist. No oropharyngeal exudates  Neck: normal, supple Respiratory: clear to auscultation bilaterally, no wheezing, no crackles. Normal respiratory effort. No accessory muscle use.  Cardiovascular: Regular rate and rhythm, no murmurs / rubs / gallops. No LE edema.  Abdomen: no tenderness. Bowel sounds positive.  Skin: no rashes Neurologic: CN 2-12 grossly intact. Strength 5/5 in all 4.  Psychiatric: Normal judgment and  insight. Alert and oriented x 3. Normal mood.    Data Reviewed: I have independently reviewed following labs and imaging studies   CBC: Recent Labs  Lab 08/15/18 1527 08/16/18 0418 08/17/18 0431 08/18/18 0422  WBC 9.1 7.7 7.1 9.4  HGB 13.5 14.0 12.8* 14.1  HCT 40.2 41.5 39.0 42.6  MCV 85.7 83.8 85.0 85.5  PLT 198 193 190 222   Basic Metabolic Panel: Recent Labs  Lab 08/15/18 1527 08/15/18 2218 08/16/18 0418 08/16/18 1004 08/17/18 0431 08/18/18 0914  NA 135  --  138  --  137 138  K 4.0  --  3.3*  --  3.5 3.7  CL 98  --  101  --  106 104  CO2 25  --  25  --  24 22  GLUCOSE 205*  --  141*  --  171* 245*  BUN 35*  --  33*  --  28* 23  CREATININE 2.59*  --  2.11*  --  1.92* 1.78*  CALCIUM 8.4*  --  8.5*  --  7.9* 8.4*  MG  --  2.4  --  2.4  --   --    GFR: Estimated Creatinine Clearance: 47.4 mL/min (A) (by C-G formula based on SCr of 1.78 mg/dL (H)). Liver Function Tests: Recent Labs  Lab 08/15/18 1527 08/16/18 0418  AST 25 25  ALT 21 19  ALKPHOS 44 44  BILITOT 1.2 0.9  PROT 6.1* 6.0*  ALBUMIN 3.2* 3.2*   Recent Labs  Lab 08/15/18 1527  LIPASE 31   No results for input(s): AMMONIA in the last 168 hours. Coagulation Profile: No results for input(s): INR, PROTIME in the last 168 hours. Cardiac Enzymes: Recent Labs  Lab 08/15/18 2218 08/16/18 0418 08/16/18 1004  TROPONINI 1.86* 1.96* 1.95*   BNP (last 3 results) No results for input(s): PROBNP in the last 8760 hours. HbA1C: No results for input(s): HGBA1C in the last 72 hours. CBG: Recent Labs  Lab 08/17/18 0746 08/17/18 1323 08/17/18 1640 08/17/18 2117 08/18/18 0740  GLUCAP 136* 224* 190* 195* 160*   Lipid Profile: Recent Labs    08/16/18 1004  CHOL 141  HDL 25*  LDLCALC 83  TRIG 782*  CHOLHDL 5.6   Thyroid Function Tests: Recent Labs    08/15/18 2218  TSH 0.907   Anemia Panel: No results for input(s): VITAMINB12, FOLATE, FERRITIN, TIBC, IRON, RETICCTPCT in the last 72  hours. Urine analysis:    Component Value Date/Time   COLORURINE YELLOW 08/15/2018 1616   APPEARANCEUR HAZY (A) 08/15/2018 1616   LABSPEC 1.018 08/15/2018 1616   PHURINE 5.0 08/15/2018 1616   GLUCOSEU NEGATIVE 08/15/2018 1616   HGBUR NEGATIVE 08/15/2018 1616   BILIRUBINUR NEGATIVE 08/15/2018 1616   KETONESUR NEGATIVE 08/15/2018 1616   PROTEINUR 100 (A) 08/15/2018 1616   NITRITE NEGATIVE 08/15/2018 1616   LEUKOCYTESUR NEGATIVE 08/15/2018 1616   Sepsis Labs: Invalid input(s): PROCALCITONIN, LACTICIDVEN  No results found for this or any previous visit (from the past 240 hour(s)).    Radiology Studies: No results found.   Scheduled Meds: . aspirin EC  81 mg Oral Daily  . atorvastatin  40 mg Oral q1800  . carvedilol  6.25 mg Oral BID WC  . clomiPHENE  25 mg Oral Daily  . furosemide  40 mg Intravenous BID  . insulin aspart  0-9 Units Subcutaneous TID WC  . omega-3 acid ethyl esters  1 g Oral QHS  . sertraline  25 mg Oral Daily   Continuous Infusions: . heparin 1,500 Units/hr (08/17/18 2034)    Pamella Pertostin Gherghe, MD, PhD [facility] Pager [phone]  If 7PM-7AM, please contact night-coverage [email or web address] Password [email or web address] 08/18/2018, 1:12 PM

## 2018-08-18 NOTE — Progress Notes (Signed)
Progress Note  Patient Name: Jose Myers Date of Encounter: 08/18/2018  Primary Cardiologist: No primary care provider on file.   Subjective   Patient feeling well today other than cough when he lies down. Echo being performed when I was present. He is amenable to cath tomorrow (see below)  Inpatient Medications    Scheduled Meds: . aspirin EC  81 mg Oral Daily  . atorvastatin  40 mg Oral q1800  . carvedilol  6.25 mg Oral BID WC  . clomiPHENE  25 mg Oral Daily  . furosemide  40 mg Intravenous BID  . insulin aspart  0-9 Units Subcutaneous TID WC  . omega-3 acid ethyl esters  1 g Oral QHS  . sertraline  25 mg Oral Daily   Continuous Infusions: . heparin 1,500 Units/hr (08/17/18 2034)   PRN Meds: acetaminophen **OR** acetaminophen, LORazepam, ondansetron **OR** ondansetron (ZOFRAN) IV, traZODone   Vital Signs    Vitals:   08/17/18 1329 08/17/18 1643 08/17/18 2017 08/18/18 0431  BP: 116/87 (!) 131/96 114/84 (!) 144/97  Pulse: 83 88 81 91  Resp: 18 20 20 18   Temp: 98.5 F (36.9 C) 98.3 F (36.8 C) 98.1 F (36.7 C) 98.1 F (36.7 C)  TempSrc: Oral Oral Oral Oral  SpO2: 97% 97% 96% 96%  Weight:    94 kg  Height:        Intake/Output Summary (Last 24 hours) at 08/18/2018 1046 Last data filed at 08/18/2018 0900 Gross per 24 hour  Intake 1610.11 ml  Output 1980 ml  Net -369.89 ml   Filed Weights   08/16/18 0500 08/17/18 0440 08/18/18 0431  Weight: 96.3 kg 94.8 kg 94 kg    Telemetry    NSR with occasional PVCs - Personally Reviewed  ECG    No new- Personally Reviewed  Physical Exam   GEN: No acute distress.   Neck: supple, JVD barely visible at 90 degrees Cardiac: regular S1 and S2, 1/6 holosystolic murmurs, no rubs or gallops.  Respiratory: faint crackles at the bases GI: Soft, nontender, mildly distended. Bowel sounds normal MS: trace-1+ pitting BL LE edema; No deformity. Neuro:  Nonfocal, moves all limbs independently Psych: Normal affect    Labs    Chemistry Recent Labs  Lab 08/15/18 1527 08/16/18 0418 08/17/18 0431 08/18/18 0914  NA 135 138 137 138  K 4.0 3.3* 3.5 3.7  CL 98 101 106 104  CO2 25 25 24 22   GLUCOSE 205* 141* 171* 245*  BUN 35* 33* 28* 23  CREATININE 2.59* 2.11* 1.92* 1.78*  CALCIUM 8.4* 8.5* 7.9* 8.4*  PROT 6.1* 6.0*  --   --   ALBUMIN 3.2* 3.2*  --   --   AST 25 25  --   --   ALT 21 19  --   --   ALKPHOS 44 44  --   --   BILITOT 1.2 0.9  --   --   GFRNONAA 25* 31* 35* 39*  GFRAA 28* 36* 41* 45*  ANIONGAP 12 12 7 12      Hematology Recent Labs  Lab 08/16/18 0418 08/17/18 0431 08/18/18 0422  WBC 7.7 7.1 9.4  RBC 4.95 4.59 4.98  HGB 14.0 12.8* 14.1  HCT 41.5 39.0 42.6  MCV 83.8 85.0 85.5  MCH 28.3 27.9 28.3  MCHC 33.7 32.8 33.1  RDW 13.5 13.8 13.8  PLT 193 190 222    Cardiac Enzymes Recent Labs  Lab 08/15/18 2218 08/16/18 0418 08/16/18 1004  TROPONINI 1.86*  1.96* 1.95*    Recent Labs  Lab 08/15/18 1845  TROPIPOC 0.88*     BNP Recent Labs  Lab 08/15/18 1649  BNP 930.7*     DDimer No results for input(s): DDIMER in the last 168 hours.   Radiology    No results found.  Cardiac Studies   TTE pending  Patient Profile     62 y.o. male with history of CAD s/p stent 20 years ago (unknown location), diabetes presented with ~10 days of abdominal bloating and pain, associated with PND, orthopnea, dyspnea on exertion, LE edema, and cough. Troponin elevated, ECG concerning for prior anteroseptal infarct. Suspected new AKI as well. Picture concerning for acute systolic heart failure, likely with recent MI that manifested as abdominal pain. No active pain now, patient feeling improved after diuresis  Assessment & Plan    NSTEMI, likely acute systolic heart failure. Suspect progression of CAD may be culprit -continue heparin drip.  -echo is being uploaded. From the bedside images I saw, has significant wall motion abnormalities and depressed EF, with akinetic apex and  large LV thrombus. Suspect he had a large MI subacutely (when he had the abdominal discomfort). Will need anticoagulation for thrombus after cath. -renal function improving with diuresis. Today 1.78 Cr with GFR near 40.  -aspirin 81 mg daily. Holding P2Y12 given possible need for CABG -holding ARB given AKI -continue carvedilol -continue atorvastatin. LFTs nl -daily weights, accurate I/O -repleted K, keep >4 -TSH nl, CBC nl -BNP 930 -UA with protein  Risks and benefits of cardiac catheterization have been discussed with the patient.  These include bleeding, infection, kidney damage, stroke, heart attack, death.  The patient understands these risks and is willing to proceed. We specifically discussed that his renal function has not fully normalized, and he is at an increased risk for contrast induced nephropathy. He understands and wishes to proceed.   Time Spent Directly with Patient: I have spent a total of 25 minutes with the patient reviewing hospital notes, telemetry, EKGs, labs and examining the patient as well as establishing an assessment and plan that was discussed personally with the patient.  > 50% of time was spent in direct patient care.  Length of Stay:  LOS: 3 days   Jodelle RedBridgette Brennon Otterness, MD, PhD [facility]  [facility]   08/18/2018, 10:46 AM      For questions or updates, please contact [facility] Please consult [email or web address] for contact info under Cardiology/STEMI.

## 2018-08-19 ENCOUNTER — Other Ambulatory Visit: Payer: Self-pay | Admitting: *Deleted

## 2018-08-19 ENCOUNTER — Encounter (HOSPITAL_COMMUNITY): Payer: Self-pay | Admitting: Interventional Cardiology

## 2018-08-19 ENCOUNTER — Inpatient Hospital Stay (HOSPITAL_COMMUNITY)
Admission: EM | Disposition: A | Discharge status: Home / Self Care | Payer: Self-pay | Source: Home / Self Care | Attending: Internal Medicine

## 2018-08-19 DIAGNOSIS — I2510 Atherosclerotic heart disease of native coronary artery without angina pectoris: Secondary | ICD-10-CM

## 2018-08-19 DIAGNOSIS — I340 Nonrheumatic mitral (valve) insufficiency: Secondary | ICD-10-CM

## 2018-08-19 DIAGNOSIS — I5041 Acute combined systolic (congestive) and diastolic (congestive) heart failure: Secondary | ICD-10-CM

## 2018-08-19 HISTORY — PX: RIGHT HEART CATH: CATH118263

## 2018-08-19 HISTORY — PX: CORONARY ANGIOGRAPHY: CATH118303

## 2018-08-19 LAB — CBC
HEMATOCRIT: 40.9 % (ref 39.0–52.0)
HEMOGLOBIN: 13.6 g/dL (ref 13.0–17.0)
MCH: 28.3 pg (ref 26.0–34.0)
MCHC: 33.3 g/dL (ref 30.0–36.0)
MCV: 85 fL (ref 78.0–100.0)
Platelets: 204 10*3/uL (ref 150–400)
RBC: 4.81 MIL/uL (ref 4.22–5.81)
RDW: 13.6 % (ref 11.5–15.5)
WBC: 9.5 10*3/uL (ref 4.0–10.5)

## 2018-08-19 LAB — BASIC METABOLIC PANEL
ANION GAP: 10 (ref 5–15)
BUN: 23 mg/dL (ref 8–23)
CHLORIDE: 106 mmol/L (ref 98–111)
CO2: 23 mmol/L (ref 22–32)
Calcium: 8.4 mg/dL — ABNORMAL LOW (ref 8.9–10.3)
Creatinine, Ser: 1.63 mg/dL — ABNORMAL HIGH (ref 0.61–1.24)
GFR calc non Af Amer: 43 mL/min — ABNORMAL LOW (ref >60)
GFR, EST AFRICAN AMERICAN: 50 mL/min — AB (ref >60)
Glucose, Bld: 162 mg/dL — ABNORMAL HIGH (ref 70–99)
POTASSIUM: 3.5 mmol/L (ref 3.5–5.1)
SODIUM: 139 mmol/L (ref 135–145)

## 2018-08-19 LAB — GLUCOSE, CAPILLARY
GLUCOSE-CAPILLARY: 155 mg/dL — AB (ref 70–99)
Glucose-Capillary: 160 mg/dL — ABNORMAL HIGH (ref 70–99)
Glucose-Capillary: 173 mg/dL — ABNORMAL HIGH (ref 70–99)
Glucose-Capillary: 245 mg/dL — ABNORMAL HIGH (ref 70–99)

## 2018-08-19 LAB — POCT I-STAT 3, VENOUS BLOOD GAS (G3P V)
Acid-base deficit: 1 mmol/L (ref 0.0–2.0)
BICARBONATE: 24.1 mmol/L (ref 20.0–28.0)
O2 Saturation: 59 %
PCO2 VEN: 39.4 mmHg — AB (ref 44.0–60.0)
TCO2: 25 mmol/L (ref 22–32)
pH, Ven: 7.394 (ref 7.250–7.430)
pO2, Ven: 31 mmHg — CL (ref 32.0–45.0)

## 2018-08-19 LAB — POCT I-STAT 3, ART BLOOD GAS (G3+)
ACID-BASE DEFICIT: 5 mmol/L — AB (ref 0.0–2.0)
BICARBONATE: 19.3 mmol/L — AB (ref 20.0–28.0)
O2 SAT: 92 %
PO2 ART: 64 mmHg — AB (ref 83.0–108.0)
TCO2: 20 mmol/L — ABNORMAL LOW (ref 22–32)
pCO2 arterial: 31.7 mmHg — ABNORMAL LOW (ref 32.0–48.0)
pH, Arterial: 7.394 (ref 7.350–7.450)

## 2018-08-19 LAB — HEPARIN LEVEL (UNFRACTIONATED): HEPARIN UNFRACTIONATED: 0.4 [IU]/mL (ref 0.30–0.70)

## 2018-08-19 SURGERY — CORONARY ANGIOGRAPHY (CATH LAB)
Anesthesia: LOCAL

## 2018-08-19 MED ORDER — LIDOCAINE HCL (PF) 1 % IJ SOLN
INTRAMUSCULAR | Status: DC | PRN
  Administered 2018-08-19 (×2): 2 mL via INTRADERMAL

## 2018-08-19 MED ORDER — HEPARIN (PORCINE) IN NACL 1000-0.9 UT/500ML-% IV SOLN
INTRAVENOUS | Status: DC | PRN
  Administered 2018-08-19 (×2): 500 mL

## 2018-08-19 MED ORDER — OXYCODONE HCL 5 MG PO TABS: 5.0000 mg | ORAL_TABLET | ORAL | Status: DC | PRN

## 2018-08-19 MED ORDER — SODIUM CHLORIDE 0.9 % WEIGHT BASED INFUSION
0.5500 mL/kg/h | INTRAVENOUS | Status: AC
  Administered 2018-08-19: 0.55 mL/kg/h via INTRAVENOUS

## 2018-08-19 MED ORDER — FENTANYL CITRATE (PF) 100 MCG/2ML IJ SOLN
INTRAMUSCULAR | Status: DC | PRN
  Administered 2018-08-19: 50 ug via INTRAVENOUS

## 2018-08-19 MED ORDER — ACETAMINOPHEN 325 MG PO TABS: 650.0000 mg | ORAL_TABLET | ORAL | Status: DC | PRN

## 2018-08-19 MED ORDER — HEPARIN SODIUM (PORCINE) 1000 UNIT/ML IJ SOLN
INTRAMUSCULAR | Status: AC
  Filled 2018-08-19: qty 1

## 2018-08-19 MED ORDER — HEPARIN (PORCINE) IN NACL 100-0.45 UNIT/ML-% IJ SOLN
1500.0000 [IU]/h | INTRAMUSCULAR | Status: DC
  Administered 2018-08-19: 1500 [IU]/h via INTRAVENOUS
  Administered 2018-08-20 – 2018-08-21 (×2): 1600 [IU]/h via INTRAVENOUS
  Administered 2018-08-22: 1500 [IU]/h via INTRAVENOUS
  Filled 2018-08-19 (×5): qty 250

## 2018-08-19 MED ORDER — VERAPAMIL HCL 2.5 MG/ML IV SOLN
INTRAVENOUS | Status: DC | PRN
  Administered 2018-08-19: 10 mL via INTRA_ARTERIAL

## 2018-08-19 MED ORDER — SODIUM CHLORIDE 0.9% FLUSH: 3.0000 mL | INTRAVENOUS | Status: DC | PRN

## 2018-08-19 MED ORDER — FENTANYL CITRATE (PF) 100 MCG/2ML IJ SOLN
INTRAMUSCULAR | Status: AC
  Filled 2018-08-19: qty 2

## 2018-08-19 MED ORDER — LIDOCAINE HCL (PF) 1 % IJ SOLN
INTRAMUSCULAR | Status: AC
  Filled 2018-08-19: qty 30

## 2018-08-19 MED ORDER — IOHEXOL 350 MG/ML SOLN
INTRAVENOUS | Status: DC | PRN
  Administered 2018-08-19: 70 mL via INTRA_ARTERIAL

## 2018-08-19 MED ORDER — HEPARIN SODIUM (PORCINE) 1000 UNIT/ML IJ SOLN
INTRAMUSCULAR | Status: DC | PRN
  Administered 2018-08-19: 5000 [IU] via INTRAVENOUS

## 2018-08-19 MED ORDER — MIDAZOLAM HCL 2 MG/2ML IJ SOLN
INTRAMUSCULAR | Status: AC
  Filled 2018-08-19: qty 2

## 2018-08-19 MED ORDER — SODIUM CHLORIDE 0.9% FLUSH
3.0000 mL | Freq: Two times a day (BID) | INTRAVENOUS | Status: DC
  Administered 2018-08-23 – 2018-08-27 (×8): 3 mL via INTRAVENOUS

## 2018-08-19 MED ORDER — MIDAZOLAM HCL 2 MG/2ML IJ SOLN
INTRAMUSCULAR | Status: DC | PRN
  Administered 2018-08-19: 1 mg via INTRAVENOUS

## 2018-08-19 MED ORDER — SODIUM CHLORIDE 0.9 % IV SOLN
250.0000 mL | INTRAVENOUS | Status: DC | PRN
  Administered 2018-08-25: 10 mL via INTRAVENOUS
  Administered 2018-09-01: 250 mL via INTRAVENOUS

## 2018-08-19 MED ORDER — ONDANSETRON HCL 4 MG/2ML IJ SOLN: 4.0000 mg | Freq: Four times a day (QID) | INTRAMUSCULAR | Status: DC | PRN

## 2018-08-19 MED ORDER — VERAPAMIL HCL 2.5 MG/ML IV SOLN
INTRAVENOUS | Status: AC
  Filled 2018-08-19: qty 2

## 2018-08-19 MED ORDER — HEPARIN (PORCINE) IN NACL 1000-0.9 UT/500ML-% IV SOLN
INTRAVENOUS | Status: AC
  Filled 2018-08-19: qty 1000

## 2018-08-19 SURGICAL SUPPLY — 12 items
CATH BALLN WEDGE 5F 110CM (CATHETERS) ×1 IMPLANT
CATH IMPULSE 5F ANG/FL3.5 (CATHETERS) ×1 IMPLANT
DEVICE RAD COMP TR BAND LRG (VASCULAR PRODUCTS) ×1 IMPLANT
GLIDESHEATH SLEND A-KIT 6F 22G (SHEATH) ×1 IMPLANT
GUIDEWIRE INQWIRE 1.5J.035X260 (WIRE) IMPLANT
INQWIRE 1.5J .035X260CM (WIRE) ×2
KIT HEART LEFT (KITS) ×2 IMPLANT
PACK CARDIAC CATHETERIZATION (CUSTOM PROCEDURE TRAY) ×2 IMPLANT
SHEATH GLIDE SLENDER 4/5FR (SHEATH) ×1 IMPLANT
SHEATH PROBE COVER 6X72 (BAG) ×1 IMPLANT
TRANSDUCER W/STOPCOCK (MISCELLANEOUS) ×2 IMPLANT
TUBING CIL FLEX 10 FLL-RA (TUBING) ×2 IMPLANT

## 2018-08-19 NOTE — Progress Notes (Signed)
Patient back from cath lab, stable. 15cc of air in TR band. Right radial/brachial level 0.

## 2018-08-19 NOTE — Plan of Care (Signed)
  Problem: Health Behavior/Discharge Planning: Goal: Ability to manage health-related needs will improve Outcome: Progressing   Problem: Education: Goal: Ability to demonstrate management of disease process will improve Outcome: Progressing   

## 2018-08-19 NOTE — Interval H&P Note (Signed)
Cath Lab Visit (complete for each Cath Lab visit)  Clinical Evaluation Leading to the Procedure:   ACS: Yes.    Non-ACS:    Anginal Classification: CCS III  Anti-ischemic medical therapy: Minimal Therapy (1 class of medications)  Non-Invasive Test Results: No non-invasive testing performed  Prior CABG: No previous CABG      History and Physical Interval Note:  08/19/2018 10:47 AM  Jose Myers  has presented today for surgery, with the diagnosis of chf/ mstemi  The various methods of treatment have been discussed with the patient and family. After consideration of risks, benefits and other options for treatment, the patient has consented to  Procedure(s): CORONARY ANGIOGRAPHY (CATH LAB) (N/A) RIGHT HEART CATH (N/A) as a surgical intervention .  The patient's history has been reviewed, patient examined, no change in status, stable for surgery.  I have reviewed the patient's chart and labs.  Questions were answered to the patient's satisfaction.     Lyn RecordsHenry W Mohd. Derflinger III

## 2018-08-19 NOTE — Consult Note (Addendum)
[address]       [place] [id number]             [phone]        Jose Rasmussenommy L Sunderlin [facility] Medical Record #[id number] Date of Birth: [birth year]  Referring: No ref. provider found Primary Care: Daisy Florooss, Charles Alan, MD Primary Cardiologist:No primary care provider on file.  Chief Complaint:    Chief Complaint  Patient presents with  . Abdominal Pain  . Leg Swelling    History of Present Illness:    The patient is a 62 year old male with a history of coronary artery disease status post previous stenting.  He also has  cardiac risk factors that including diabetes mellitus type 2 and hypertension.  He presented to the emergency department on 08/15/2018 chief complaint of shortness of breath.  Additionally had been experiencing abdominal pain for approximately 1 week.  He associated this with a meal at a [employer] restaurant and it was also associated with vomiting.  He did not have any diarrhea.  He denied chest pain.  The approximate 24 hours prior to presentation he noted the increasing dyspnea.  He was found to the emergency department to have signs and symptoms consistent with congestive heart failure including bilateral lower extremity edema and a BNP of 930.  Troponin was  elevated at 0.8.  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  Cardiology consultation was obtained and he was started on Lasix and heparin.  A CT scan of the pelvis and abdomen were unremarkable.  Liver function studies were unremarkable.  Creatinine was noted to be elevated at 2.59.    He has had some symptoms of PND and orthopnea having to sleep in a chair.  There is also been some cough.  The stent was [facility] approximately 20 years ago by [facility] after abnormal stress test.  He did also have some left shoulder pain but he associates this with previous shoulder surgery.He was admitted for further evaluation and treatment of  heart failure and known coronary  artery disease with non-STEMI and likely systolic heart failure.  On today's date he underwent cardiac catheterization.  He is found to have significant three-vessel, disease please see the report listed below.  We are consulted for consideration of coronary artery surgical revascularization.  Echocardiogram was also performed please see the report below.  He does have impaired ejection fraction of 25%-30% and he does have moderate mitral regurgitation.  Current Activity/ Functional Status: Patient is independent with mobility/ambulation, transfers, ADL's, IADL's.   Zubrod Score: At the time of surgery this patient's most appropriate activity status/level should be described as: []     0    Normal activity, no symptoms []     1    Restricted in physical strenuous activity but ambulatory, able to do out light work [x]     2    Ambulatory and capable of self care, unable to do work activities, up and about                 more than 50%  Of the time                            []     3    Only limited self care, in bed greater than 50% of waking hours []     4    Completely disabled, no self care, confined  to bed or chair []     5    Moribund  Past Medical History:  Diagnosis Date  . Diabetes mellitus without complication (HCC)   . Hypertension     Past Surgical History:  Procedure Laterality Date  . CARDIAC CATHETERIZATION    . CORONARY ANGIOGRAPHY N/A 08/19/2018   Procedure: CORONARY ANGIOGRAPHY (CATH LAB);  Surgeon: Lyn Records, MD;  Location: [facility] INVASIVE CV LAB;  Service: Cardiovascular;  Laterality: N/A;  . CORONARY ANGIOPLASTY    . EYE SURGERY    . RIGHT HEART CATH N/A 08/19/2018   Procedure: RIGHT HEART CATH;  Surgeon: Lyn Records, MD;  Location: [facility] INVASIVE CV LAB;  Service: Cardiovascular;  Laterality: N/A;  Left shoulder surgery Left knee surgery Bilateral cateracts PTCA LAD  Social History   Tobacco Use  Smoking Status Never Smoker  Smokeless Tobacco Never Used    Social  History   Substance and Sexual Activity  Alcohol Use Yes   Comment: Occasionally.     No Known Allergies  Current Facility-Administered Medications  Medication Dose Route Frequency Provider Last Rate Last Dose  . 0.9 %  sodium chloride infusion  250 mL Intravenous PRN Lyn Records, MD      . 0.9% sodium chloride infusion  0.55 mL/kg/hr Intravenous Continuous Lyn Records, MD 51.6 mL/hr at 08/19/18 1341 0.55 mL/kg/hr at 08/19/18 1341  . acetaminophen (TYLENOL) tablet 650 mg  650 mg Oral Q6H PRN Eduard Clos, MD   650 mg at 08/16/18 2358   Or  . acetaminophen (TYLENOL) suppository 650 mg  650 mg Rectal Q6H PRN Eduard Clos, MD      . Melene Muller ON 08/20/2018] aspirin EC tablet 81 mg  81 mg Oral Daily Gherghe, Costin M, MD      . atorvastatin (LIPITOR) tablet 40 mg  40 mg Oral q1800 Eduard Clos, MD   40 mg at 08/18/18 1727  . carvedilol (COREG) tablet 6.25 mg  6.25 mg Oral BID WC Regalado, Belkys A, MD   6.25 mg at 08/19/18 0842  . clomiPHENE (CLOMID) tablet 25 mg  25 mg Oral Daily Eduard Clos, MD   25 mg at 08/19/18 1610  . furosemide (LASIX) injection 40 mg  40 mg Intravenous BID [name], MD   40 mg at 08/19/18 0836  . heparin ADULT infusion 100 units/mL (25000 units/246mL sodium chloride 0.45%)  1,500 Units/hr Intravenous Continuous Gerilyn Nestle, RPH      . insulin aspart (novoLOG) injection 0-9 Units  0-9 Units Subcutaneous TID WC Regalado, Belkys A, MD   2 Units at 08/19/18 0835  . LORazepam (ATIVAN) tablet 0.5 mg  0.5 mg Oral Daily PRN Eduard Clos, MD   0.5 mg at 08/19/18 1013  . omega-3 acid ethyl esters (LOVAZA) capsule 1 g  1 g Oral QHS Eduard Clos, MD   1 g at 08/18/18 2214  . ondansetron (ZOFRAN) tablet 4 mg  4 mg Oral Q6H PRN Eduard Clos, MD       Or  . ondansetron [facility]) injection 4 mg  4 mg Intravenous Q6H PRN Eduard Clos, MD      . oxyCODONE (Oxy IR/ROXICODONE) immediate release tablet 5-10  mg  5-10 mg Oral Q4H PRN Lyn Records, MD      . sertraline (ZOLOFT) tablet 25 mg  25 mg Oral Daily Eduard Clos, MD   25 mg at 08/19/18 0835  . sodium chloride flush (NS) 0.9 %  injection 3 mL  3 mL Intravenous Q12H Lyn Records, MD      . sodium chloride flush (NS) 0.9 % injection 3 mL  3 mL Intravenous PRN Lyn Records, MD      . traZODone (DESYREL) tablet 50 mg  50 mg Oral QHS PRN Eduard Clos, MD   50 mg at 08/19/18 0119    Medications Prior to Admission  Medication Sig Dispense Refill Last Dose  . Apple Cid Vn-Grn Tea-Bit Or-Cr (APPLE CIDER VINEGAR PLUS) TABS Take 1 tablet by mouth daily.   Past Week at Unknown time  . carvedilol (COREG) 6.25 MG tablet Take 6.25 mg by mouth 2 (two) times daily with a meal.     . clomiPHENE (CLOMID) 50 MG tablet Take 25 mg by mouth daily.   Past Week at Unknown time  . glimepiride (AMARYL) 4 MG tablet Take 4 mg by mouth 2 (two) times daily.   08/15/2018 at Unknown time  . LORazepam (ATIVAN) 1 MG tablet Take 0.5-1 mg by mouth daily as needed for anxiety.   unk  . losartan (COZAAR) 100 MG tablet Take 100 mg by mouth daily.  4 08/15/2018 at Unknown time  . metFORMIN (GLUCOPHAGE) 500 MG tablet Take 500 mg by mouth 3 (three) times daily.    08/15/2018 at Unknown time  . Omega 3 340 MG CPDR Take 1 capsule by mouth at bedtime.   Past Week at Unknown time  . Prenat-FePoly-Fered-FA-Omega 3 (DUET DHA 430 PO) Take 1 capsule by mouth daily.   08/15/2018 at Unknown time  . sertraline (ZOLOFT) 50 MG tablet Take 25 mg by mouth daily.  4 08/15/2018 at Unknown time  . traZODone (DESYREL) 50 MG tablet Take 50 mg by mouth at bedtime as needed.  0 unk  . verapamil (VERELAN PM) 240 MG 24 hr capsule Take 240 mg by mouth 2 (two) times daily.    08/15/2018 at Unknown time    Family History  Problem Relation Age of Onset  . Diabetes Mellitus II Mother   . Throat cancer Brother      Review of Systems:   Review of Systems  Constitutional: Positive for  malaise/fatigue and weight loss. Negative for chills, diaphoresis and fever.  HENT: Positive for hearing loss and tinnitus. Negative for congestion, ear discharge, ear pain, nosebleeds, sinus pain and sore throat.        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  Respiratory: Positive for cough, sputum production, shortness of breath and wheezing. Negative for hemoptysis and stridor.   Cardiovascular: Positive for chest pain, palpitations, orthopnea, leg swelling and PND. Negative for claudication.  Gastrointestinal: Positive for abdominal pain, diarrhea, heartburn, nausea and vomiting. Negative for blood in stool, constipation and melena.  Genitourinary: Positive for dysuria, frequency and urgency. Negative for flank pain and hematuria.  Musculoskeletal: Positive for back pain, joint pain and neck pain. Negative for myalgias.  Skin: Negative for itching and rash.  Neurological: Positive for tingling and loss of consciousness. Negative for dizziness, tremors, sensory change, speech change, focal weakness, seizures, weakness and headaches.  Endo/Heme/Allergies: Negative for environmental allergies and polydipsia. Does not bruise/bleed easily.  Psychiatric/Behavioral: Positive for hallucinations and memory loss. Negative for depression, substance abuse and suicidal ideas. The patient is nervous/anxious. The patient does not have insomnia.        PTSD        Physical  Exam: BP 116/82 (BP Location: Left Arm)   Pulse 88   Temp 97.9 F (36.6 C) (Oral)   Resp 20   Ht 5\' 9"  (1.753 m)   Wt 93.8 kg   SpO2 99%   BMI 30.55 kg/m    Physical Exam  Constitutional: He is oriented to person, place, and time. He appears well-developed and well-nourished. No distress.  HENT:  Head: Normocephalic and atraumatic.  Mouth/Throat: Oropharynx is clear and moist. No oropharyngeal exudate.  Eyes:  Pupils are equal, round, and reactive to light. Conjunctivae and EOM are normal. Right eye exhibits no discharge. Left eye exhibits no discharge. No scleral icterus.  Neck: No JVD present. No tracheal deviation present. No thyromegaly present.  Cardiovascular: Normal rate and regular rhythm. Exam reveals no gallop and no friction rub.  Murmur heard. Soft systolic murmur Absent DP/PT pulses to palpation  Pulmonary/Chest: Effort normal and breath sounds normal. No stridor. No respiratory distress. He has no wheezes. He has no rales. He exhibits no tenderness.  Abdominal: Soft. Bowel sounds are normal. He exhibits no distension and no mass. There is no tenderness. There is no rebound and no guarding.  Musculoskeletal: He exhibits edema. He exhibits no tenderness or deformity.  Lymphadenopathy:    He has no cervical adenopathy.  Neurological: He is alert and oriented to person, place, and time. He has normal reflexes. He exhibits normal muscle tone. Coordination normal.  Skin: Skin is warm and dry. No rash noted. He is not diaphoretic. No erythema. No pallor.  Psychiatric: He has a normal mood and affect. His behavior is normal. Thought content normal.       Diagnostic Studies & Laboratory data:    Result status: Final result                              *[place]*                   *[facility]*                         [address]                        [place], [place] [address]                            [phone]  ------------------------------------------------------------------- Transthoracic Echocardiography  Patient:    Jose Myers, Jose Myers MR #:       [id number] Study Date: 08/18/2018 Gender:     M Age:        78 Height:     175.3 cm Weight:     94 kg BSA:        2.17 m^2 Pt. Status: Room:       3E04C   ADMITTING    Peterson Lombard, Belkys A  REFERRING    Regalado, Belkys A  PERFORMING   [facility], Inpatient   ATTENDING    Tegeler, Ronita Hipps  SONOGRAPHER  Ilona Sorrel  cc:  ------------------------------------------------------------------- LV EF: 25% -   30%  ------------------------------------------------------------------- Indications:      Dyspnea 786.09.  ------------------------------------------------------------------- History:   PMH:   Dyspnea.  Congestive heart failure.  Risk factors:  Diabetes mellitus.  ------------------------------------------------------------------- Study Conclusions  - Left ventricle: The cavity  size was normal. Wall thickness was   increased in a pattern of mild LVH. Systolic function was   severely reduced. The estimated ejection fraction was in the   range of 25% to 30%. Diffuse hypokinesis. There is akinesis of   the anteroseptal, anterior, and anterolateral myocardium. There   was a large, 3.3 cm (L) x 1.8 cm (W), spherical, fixed, apical   anteriorthrombusassociated with an akinetic segment. - Aortic valve: Trileaflet; mildly thickened, mildly calcified   leaflets. - Mitral valve: There was moderate regurgitation. - Left atrium: The atrium was mildly dilated. - Tricuspid valve: There was mild regurgitation. - Pulmonary arteries: Systolic pressure was moderately increased.   PA peak pressure: 59 mm Hg (S).  Recommendations:  This procedure has been discussed with the referring physician.  ------------------------------------------------------------------- Study data:  No prior study was available for comparison.  Study status:  Routine.  Procedure:  The patient reported no pain pre or post test. Transthoracic echocardiography. Image quality was adequate.  Study completion:  There were no complications. Transthoracic echocardiography.  M-mode, complete 2D, spectral Doppler, and color Doppler.  Birthdate:  Patient birthdate: [birth year].  Age:  Patient is 62 yr old.  Sex:  Gender: male. BMI: 30.6 kg/m^2.  Blood pressure:     144/97   Patient status: Inpatient.  Study date:  Study date: 08/18/2018. Study time: 10:26 AM.  Location:  Bedside.  -------------------------------------------------------------------  ------------------------------------------------------------------- Left ventricle:  The cavity size was normal. Wall thickness was increased in a pattern of mild LVH. Systolic function was severely reduced. The estimated ejection fraction was in the range of 25% to 30%. Diffuse hypokinesis. There was a large, 3.3 cm (L) x 1.8 cm (W), spherical, fixed, apical anteriorthrombusassociated with an akinetic segment.  Regional wall motion abnormalities:   There is akinesis of the anteroseptal, anterior, and anterolateral myocardium. There was no evidence of elevated ventricular filling pressure by Doppler parameters.  ------------------------------------------------------------------- Aortic valve:   Trileaflet; mildly thickened, mildly calcified leaflets. Mobility was not restricted.  Doppler:  Transvalvular velocity was within the normal range. There was no stenosis. There was no regurgitation.  ------------------------------------------------------------------- Aorta:  Aortic root: The aortic root was normal in size.  ------------------------------------------------------------------- Mitral valve:   Structurally normal valve.   Mobility was not restricted.  Doppler:  Transvalvular velocity was within the normal range. There was no evidence for stenosis. There was moderate regurgitation.    Peak gradient (D): 4 mm Hg.  ------------------------------------------------------------------- Left atrium:  The atrium was mildly dilated.  ------------------------------------------------------------------- Right ventricle:  The cavity size was normal. Wall thickness was normal. Systolic function was normal.  ------------------------------------------------------------------- Pulmonic valve:   Poorly  visualized.  Structurally normal valve. Cusp separation was normal.  Doppler:  Transvalvular velocity was within the normal range. There was no evidence for stenosis. There was no regurgitation.  ------------------------------------------------------------------- Tricuspid valve:   Structurally normal valve.    Doppler: Transvalvular velocity was within the normal range. There was mild regurgitation.  ------------------------------------------------------------------- Pulmonary artery:   The main pulmonary artery was normal-sized. Systolic pressure was moderately increased.  ------------------------------------------------------------------- Right atrium:  The atrium was normal in size.  ------------------------------------------------------------------- Pericardium:  There was no pericardial effusion.  ------------------------------------------------------------------- Systemic veins: Inferior vena cava: The vessel was dilated. The respirophasic diameter changes were blunted (< 50%), consistent with elevated central venous pressure.  ------------------------------------------------------------------- Measurements   Left ventricle  Value        Reference  LV ID, ED, PLAX chordal                    49.7  mm     43 - 52  LV ID, ES, PLAX chordal            (H)     39.9  mm     23 - 38  LV fx shortening, PLAX chordal     (L)     20    %      >=29  LV PW thickness, ED                        12    mm     ----------  IVS/LV PW ratio, ED                        1.08         <=1.3  Stroke volume, 2D                          33    ml     ----------  Stroke volume/bsa, 2D                      15    ml/m^2 ----------  LV ejection fraction, 1-p A4C              38    %      ----------  LV end-diastolic volume, 2-p               187   ml     ----------  LV end-systolic volume, 2-p                129   ml     ----------  LV ejection fraction, 2-p                   31    %      ----------  Stroke volume, 2-p                         58    ml     ----------  LV end-diastolic volume/bsa, 2-p           86    ml/m^2 ----------  LV end-systolic volume/bsa, 2-p            60    ml/m^2 ----------  Stroke volume/bsa, 2-p                     26.8  ml/m^2 ----------  LV e&', lateral                             9.64  cm/s   ----------  LV E/e&', lateral                           11           ----------    Ventricular septum                         Value        Reference  IVS thickness, ED  12.9  mm     ----------    LVOT                                       Value        Reference  LVOT ID, S                                 20    mm     ----------  LVOT area                                  3.14  cm^2   ----------  LVOT mean velocity, S                      44    cm/s   ----------  LVOT VTI, S                                10.4  cm     ----------    Aorta                                      Value        Reference  Aortic root ID, ED                         33    mm     ----------    Left atrium                                Value        Reference  LA ID, A-P, ES                             48    mm     ----------  LA ID/bsa, A-P                     (H)     2.22  cm/m^2 <=2.2  LA volume, S                               81.5  ml     ----------  LA volume/bsa, S                           37.6  ml/m^2 ----------  LA volume, ES, 1-p A4C                     68.4  ml     ----------  LA volume/bsa, ES, 1-p A4C                 31.6  ml/m^2 ----------  LA volume, ES, 1-p A2C                     80.5  ml     ----------  LA volume/bsa, ES, 1-p A2C                 37.2  ml/m^2 ----------    Mitral valve                               Value        Reference  Mitral E-wave peak velocity                106   cm/s   ----------  Mitral A-wave peak velocity                55.3  cm/s   ----------  Mitral deceleration time                   172    ms     150 - 230  Mitral peak gradient, D                    4     mm Hg  ----------  Mitral E/A ratio, peak                     1.9          ----------  Mitral maximal regurg velocity,            423   cm/s   ----------  PISA  Mitral regurg VTI, PISA                    120   cm     ----------    Pulmonary arteries                         Value        Reference  PA pressure, S, DP                 (H)     59    mm Hg  <=30    Tricuspid valve                            Value        Reference  Tricuspid regurg peak velocity             333   cm/s   ----------  Tricuspid peak RV-RA gradient              44    mm Hg  ----------    Right atrium                               Value        Reference  RA ID, S-I, ES, A4C                        46.6  mm     34 - 49  RA area, ES, A4C                           14    cm^2   8.3 - 19.5  RA volume, ES, A/L                         35.4  ml     ----------  RA volume/bsa, ES, A/L                     16.3  ml/m^2 ----------    Systemic veins                             Value        Reference  Estimated CVP                              15    mm Hg  ----------    Right ventricle                            Value        Reference  TAPSE                                      18.3  mm     ----------  RV pressure, S, DP                 (H)     59    mm Hg  <=30  RV s&', lateral, S                          11.6  cm/s   ----------  Legend: (L)  and  (H)  mark values outside specified reference range.  ------------------------------------------------------------------- Prepared and Electronically Authenticated by  Donato Schultz, M.D. 2019-08-21T12:25:51  MERGE Images   Show images for ECHOCARDIOGRAM COMPLETE  Patient Information   Patient Name Jose Myers, Jose Myers Sex Male DOB 1956/05/05 SSN [id number]  Reason for Exam  Priority: Routine  Comments:      Severe multivessel CAD including left main.  Distal left main 85%.  Diffusely diseased and  totally occluded LAD in the mid segment.  Proximal 80 to 99% obstruction.  Diagonals are totally occluded.  80% ostial circumflex, 90% proximal first obtuse marginal, and total occlusion of the circumflex beyond the first marginal.  Diffuse disease in the first marginal.  Dominant right coronary with segmental 70% distal stenosis, 85% distal RCA beyond the PDA but before the first large left ventricular branch.  Diffuse disease in the proximal to midportion of the large right ventricular branch and the second posterolateral branch.  Left ventriculography and hemodynamic recordings were not performed because of LV apical thrombus.  Recommendations:   T CTS evaluation for surgical revascularization.  Guideline directed therapy for systolic heart failure: SGLT2 therapy for diabetes, resume ARB but preferably Entresto for decreased LV function, beta-blocker therapy as tolerated, and diuresis as needed for symptom control.  Monitor kidney function closely.  Anticoagulation for acute coronary syndrome and LV thrombus   Recommend uninterrupted dual antiplatelet therapy with Aspirin 81mg  daily for a minimum of Eternity.  Indications   NSTEMI (non-ST elevated myocardial infarction) (HCC) [I21.4 (ICD-10-CM)]  Non-ST elevation (NSTEMI) myocardial infarction (HCC) [I21.4 (ICD-10-CM)]  Coronary artery disease involving native coronary artery of native heart with unstable angina pectoris (HCC) [I25.110 (ICD-10-CM)]  Procedural Details/Technique   Technical Details The right radial area was sterilely prepped and draped. Intravenous sedation with Versed and fentanyl was administered. 1% Xylocaine was infiltrated to achieve local analgesia.  Using real-time vascular ultrasound, a double wall stick with an angiocath was utilized to obtain intra-arterial access. A VUS image was saved for the permanent record.The modified Seldinger technique was used to place a 54F " Slender" sheath in the right radial  artery. Weight based heparin was administered. Coronary angiography was done using 5 F catheters. Right coronary angiography was performed with a JR4. Left coronary angiography was performed with a JL 3.5 cm.  Right heart catheterization was performed by exchanging a previously placed antecubital IV angio-cath for a 5 French Slender sheath. 1% Xylocaine was used to locally nesthetize the area around the IV site. The IV catheter was wired using an .018 guidewire. The modified Seldinger technique was used to place the 5 [place] sheath. Double glove technique was used to enhance sterility. After sheath insertion, right heart cath was performed using a 5 French balloon tipped catheter and fluoroscopic guidance. Pressures were recorded in each chamber and in the pulmonary capillary wedge position.. The main pulmonary artery O2 saturation was sampled.   Hemostasis was achieved using a pneumatic band.  During this procedure the patient is administered a total of Versed 1 mg and Fentanyl 50 mg to achieve and maintain moderate conscious sedation. The patient's heart rate, blood pressure, and oxygen saturation are monitored continuously during the procedure. The period of conscious sedation is 36 minutes, of which I was present face-to-face 100% of this time.   Estimated blood loss <50 mL.  During this procedure the patient was administered the following to achieve and maintain moderate conscious sedation: Versed 1 mg, Fentanyl 50 mcg, while the patient's heart rate, blood pressure, and oxygen saturation were continuously monitored. The period of conscious sedation was 36 minutes, of which I was present face-to-face 100% of this time.  Coronary Findings   Diagnostic  Dominance: Right  Left Main  Mid LM to Dist LM lesion 80% stenosed  Mid LM to Dist LM lesion is 80% stenosed.  Dist LM to Ost LAD lesion 99% stenosed  Dist LM to Ost LAD lesion is 99% stenosed.  Left Anterior Descending  Ost LAD to Prox LAD  lesion 99% stenosed  Ost LAD to Prox LAD lesion is 99% stenosed. The lesion was previously treated.  Prox LAD to Mid LAD lesion 75% stenosed  Prox LAD to Mid LAD lesion is 75% stenosed.  Mid LAD lesion 100% stenosed  Mid LAD lesion is 100% stenosed.  First Diagonal Branch  Vessel is small in size.  Ost 1st Diag to 1st Diag lesion 80% stenosed  Ost 1st Diag to 1st Diag lesion is 80% stenosed.  Left Circumflex  Ost Cx to Prox Cx lesion 85% stenosed  Ost Cx to Prox Cx lesion is 85% stenosed.  Prox Cx to Mid Cx lesion 100% stenosed  Prox Cx to Mid Cx lesion is 100% stenosed.  First Obtuse Marginal Branch  Vessel is large in size.  Ost 1st Mrg lesion 90% stenosed  Ost 1st Mrg lesion is 90% stenosed.  1st Mrg lesion 80% stenosed  1st Mrg lesion is 80% stenosed.  Right Coronary Artery  Prox RCA to Mid RCA lesion 50% stenosed  Prox RCA to Mid RCA lesion is 50% stenosed.  Mid RCA to Dist RCA lesion 70% stenosed  Mid RCA to Dist RCA lesion is 70% stenosed.  Acute Marginal Branch  Acute Mrg lesion 80% stenosed  Acute Mrg lesion is 80% stenosed.  Right Posterior Atrioventricular Branch  Post Atrio lesion 75% stenosed  Post Atrio lesion  is 75% stenosed.  Second Right Posterolateral  2nd RPLB lesion 80% stenosed  2nd RPLB lesion is 80% stenosed.  Intervention   No interventions have been documented.  Right Heart   Right Heart Pressures Hemodynamic findings consistent with pulmonary hypertension.  Coronary Diagrams   Diagnostic Diagram       Implants          Recent Radiology Findings:   No results found.   I have independently reviewed the above radiologic studies and discussed with the patient   Recent Lab Findings: Lab Results  Component Value Date   WBC 9.5 08/19/2018   HGB 13.6 08/19/2018   HCT 40.9 08/19/2018   PLT 204 08/19/2018   GLUCOSE 162 (H) 08/19/2018   CHOL 141 08/16/2018   TRIG 163 (H) 08/16/2018   HDL 25 (L) 08/16/2018   LDLCALC 83 08/16/2018    ALT 19 08/16/2018   AST 25 08/16/2018   NA 139 08/19/2018   K 3.5 08/19/2018   CL 106 08/19/2018   CREATININE 1.63 (H) 08/19/2018   BUN 23 08/19/2018   CO2 23 08/19/2018   TSH 0.907 08/15/2018      Assessment / Plan: 62 year old male who ruled in for non-STEMI, presenting with systolic congestive heart failure which is now been medically stabilized.  The catheterization reveals severe three-vessel coronary artery disease and ejection fraction on echocardiogram is impaired at 25 to 30%.  He appears to be a suitable candidate to proceed with coronary artery surgical revascularization.  He will need aggressive lifestyle and nutrition management long-term of his diabetes and hypertension.  He does have some renal insufficiency that presented acutely.  Creatinine is showing improvement over time.  Is uncertain of his baseline.       Rowe Clack, PA-C 08/19/2018 4:20 PM Pager [phone]   I have seen and examined the patient and agree with the assessment as outlined above by Gershon Crane, PA-C.  Patient is a 62 year old male with known history of coronary artery disease (status post PCI and stenting of the LAD in 2004), hypertension, and type 2 diabetes mellitus who presents with likely recent out of hospital acute transmural myocardial infarction involving the left anterior descending coronary artery complicated by acute systolic congestive heart failure and left ventricular mural thrombus.  Symptoms of congestive heart failure have improved on medical therapy and the patient is currently remarkably asymptomatic.    I have personally reviewed the patient's transthoracic echocardiogram and diagnostic cardiac catheterization.  Echocardiogram performed yesterday revealed severe left ventricular systolic dysfunction with akinesis of the anterior septum, anterolateral wall, distal anterior wall and apex.  There is a fairly large mural thrombus at the apex.  There was moderate (3+) mitral regurgitation.   Ejection fraction was estimated 25 to 30%.  The high lateral wall and inferior wall appear to be moving reasonably well.  Right ventricular size and function appears normal.  Diagnostic cardiac catheterization reveals severely calcified and diffusely diseased coronary arteries.  There is 85% distal stenosis of the left main coronary artery.  There is 100% occlusion of the mid left anterior descending coronary artery.  All the diagonal branches are occluded.  There is no collateral filling of any targets in the left anterior descending coronary territory.  There is 80% ostial stenosis of left circumflex with 90% proximal stenosis of the first obtuse marginal.  There is total occlusion of the circumflex beyond obtuse marginal.  The bifurcating obtuse marginal has diffuse disease with tight distal disease.  Both branches  appear to be poor target vessels for grafting.  There is right dominant coronary circulation with diffuse disease.  There is 70% stenosis of the distal right coronary artery with 85% stenosis of the distal right coronary artery beyond the takeoff of the posterior descending coronary artery.  The posterior lateral branch is diffusely diseased and probably not graftable.  The posterior descending coronary artery is diffusely diseased with tight distal disease.  The patient's right heart catheterization looked surprisingly normal with minimal elevation of the pulmonary artery pressure and normal cardiac output.  Although the patient would clearly benefit from coronary revascularization, I am not sure that it is feasible and risks at this juncture would be extremely high.  The patient has very poor target vessels for grafting.  There is no way that complete revascularization would be feasible, and the patient would be at extremely high risk for perioperative myocardial infarction and subsequent post cardiotomy cardiogenic shock.  I suspect he might require mechanical circulatory support, and with  pre-existing left ventricular mural thrombus and mitral regurgitation his risks would be unacceptably high.  It's possible that PCI/stenting of left main + ostial LCx might be best option, although Impella support would not be feasible due to LV mural thrombus.  I have personally reviewed the patient's echocardiogram and cath films with Dr. Gala Romney Dr. Katrinka Blazing.  Will review his films with my partners.  I have discussed matters with the patient in his hospital room this evening.  His family is not currently present to participate in discussion.  All questions answered.  We will continue to follow.   I spent in excess of 90 minutes during the conduct of this hospital encounter and >50% of this time involved direct face-to-face encounter with the patient for counseling and/or coordination of their care.    Purcell Nails, MD 08/19/2018 7:30 PM

## 2018-08-19 NOTE — Plan of Care (Signed)
  Problem: Clinical Measurements: Goal: Ability to maintain clinical measurements within normal limits will improve Outcome: Progressing Goal: Cardiovascular complication will be avoided Outcome: Progressing   Problem: Skin Integrity: Goal: Risk for impaired skin integrity will decrease 08/19/2018 0319 by Claire ShownPoteat, Danya Spearman A, RN Outcome: Progressing 08/19/2018 0314 by Claire ShownPoteat, Ripken Rekowski A, RN Outcome: Progressing   Problem: Education: Goal: Ability to demonstrate management of disease process will improve Outcome: Progressing   Problem: Activity: Goal: Capacity to carry out activities will improve Outcome: Progressing   Problem: Cardiac: Goal: Ability to achieve and maintain adequate cardiopulmonary perfusion will improve Outcome: Progressing

## 2018-08-19 NOTE — Progress Notes (Signed)
PROGRESS NOTE  Jose Myers UEA:[id number] DOB: 25-Nov-1956 DOA: 08/15/2018 PCP: Daisy Floro, MD   LOS: 4 days   Brief Narrative / Interim history: 62 year old male with history of coronary artery disease with stents in the past, type 2 diabetes mellitus, hypertension, who presents to the hospital with abdominal pain and nausea, initially thought it was due to food poisoning.  He had no diarrhea.  He denies any chest pain.  He is also been complaining of progressive shortness of breath, and on admission he was found to have fluid overload with bilateral lower extremity edema as well as chest x-ray showing pulmonary edema.  His troponin was found to be elevated cardiology was consulted given concern for NSTEMI.  He was started on heparin and IV Lasix.  Assessment & Plan: Principal Problem:   NSTEMI (non-ST elevated myocardial infarction) (HCC) Active Problems:   Acute CHF (congestive heart failure) (HCC)   Acute renal failure (ARF) (HCC)   Elevated troponin   Abdominal pain   Type 2 diabetes mellitus with renal manifestations (HCC)   Hypervolemia   Acute systolic CHF -likely in the stetting of NSTEMI, improved with diuresis -Cardiac catheterization today with severe multivessel disease, cardiology recommends thoracic surgery consultation -Continue anticoagulation with heparin -2D echo done today showed an EF of 25 to 30% with diffuse hypokinesis, and a large anterior thrombus associated with an akinetic segment  NSTEMI -Detailed cardiac cath results as below, cardiology to consult thoracic surgery  Acute kidney injury -unclear baseline, Cr improving  DM -continue SSI  HTN -hold Cozaar due to AKI, continue Coreg   HLD -continue statin    DVT prophylaxis: heparin gtt Code Status: Full code Family Communication: no family at bedside Disposition Plan: home when ready   Consultants:   Cardiology   Procedures:   2D echo:  Study Conclusions - Left  ventricle: The cavity size was normal. Wall thickness was increased in a pattern of mild LVH. Systolic function was severely reduced. The estimated ejection fraction was in the range of 25% to 30%. Diffuse hypokinesis. There is akinesis of the anteroseptal, anterior, and anterolateral myocardium. There was a large, 3.3 cm (L) x 1.8 cm (W), spherical, fixed, apical anteriorthrombusassociated with an akinetic segment. - Aortic valve: Trileaflet; mildly thickened, mildly calcified leaflets. - Mitral valve: There was moderate regurgitation. - Left atrium: The atrium was mildly dilated. - Tricuspid valve: There was mild regurgitation. - Pulmonary arteries: Systolic pressure was moderately increased. PA peak pressure: 59 mm Hg (S).  Cath 8/22   Severe multivessel CAD including left main.  Distal left main 85%.  Diffusely diseased and totally occluded LAD in the mid segment.  Proximal 80 to 99% obstruction.  Diagonals are totally occluded.  80% ostial circumflex, 90% proximal first obtuse marginal, and total occlusion of the circumflex beyond the first marginal.  Diffuse disease in the first marginal.  Dominant right coronary with segmental 70% distal stenosis, 85% distal RCA beyond the PDA but before the first large left ventricular branch.  Diffuse disease in the proximal to midportion of the large right ventricular branch and the second posterolateral branch.  Left ventriculography and hemodynamic recordings were not performed because of LV apical thrombus.  Recommendations:   T CTS evaluation for surgical revascularization.  Guideline directed therapy for systolic heart failure: SGLT2 therapy for diabetes, resume ARB but preferably Entresto for decreased LV function, beta-blocker therapy as tolerated, and diuresis as needed for symptom control.  Monitor kidney function closely.  Anticoagulation for acute  coronary syndrome and LV thrombus    Antimicrobials:  None     Subjective: -awaiting cath this morning, no chest pain or dyspnea.   Objective: Vitals:   08/19/18 0515 08/19/18 0842 08/19/18 1036 08/19/18 1211  BP: (!) 138/96 138/90  117/82  Pulse: 93 92  81  Resp: 18   20  Temp: 98.2 F (36.8 C)   97.9 F (36.6 C)  TempSrc: Oral   Oral  SpO2: 98%  97% 91%  Weight: 93.8 kg     Height:        Intake/Output Summary (Last 24 hours) at 08/19/2018 1332 Last data filed at 08/19/2018 1041 Gross per 24 hour  Intake 1662.58 ml  Output 1375 ml  Net 287.58 ml   Filed Weights   08/17/18 0440 08/18/18 0431 08/19/18 0515  Weight: 94.8 kg 94 kg 93.8 kg    Examination:  Constitutional: No distress Eyes: No scleral icterus ENMT: Moist mucous membranes Respiratory: Clear to auscultation bilaterally without wheezing or crackles. Cardiovascular: Regular rate and rhythm, no murmurs heard.  No peripheral edema Abdomen: Soft, nontender, nondistended, positive bowel sounds Skin: No rashes seen Neurologic: Nonfocal, ambulatory   Data Reviewed: I have independently reviewed following labs and imaging studies   CBC: Recent Labs  Lab 08/15/18 1527 08/16/18 0418 08/17/18 0431 08/18/18 0422 08/19/18 0442  WBC 9.1 7.7 7.1 9.4 9.5  HGB 13.5 14.0 12.8* 14.1 13.6  HCT 40.2 41.5 39.0 42.6 40.9  MCV 85.7 83.8 85.0 85.5 85.0  PLT 198 193 190 222 204   Basic Metabolic Panel: Recent Labs  Lab 08/15/18 1527 08/15/18 2218 08/16/18 0418 08/16/18 1004 08/17/18 0431 08/18/18 0914 08/19/18 0442  NA 135  --  138  --  137 138 139  K 4.0  --  3.3*  --  3.5 3.7 3.5  CL 98  --  101  --  106 104 106  CO2 25  --  25  --  24 22 23   GLUCOSE 205*  --  141*  --  171* 245* 162*  BUN 35*  --  33*  --  28* 23 23  CREATININE 2.59*  --  2.11*  --  1.92* 1.78* 1.63*  CALCIUM 8.4*  --  8.5*  --  7.9* 8.4* 8.4*  MG  --  2.4  --  2.4  --   --   --    GFR: Estimated Creatinine Clearance: 51.7 mL/min (A) (by C-G formula based on SCr of 1.63 mg/dL (H)). Liver  Function Tests: Recent Labs  Lab 08/15/18 1527 08/16/18 0418  AST 25 25  ALT 21 19  ALKPHOS 44 44  BILITOT 1.2 0.9  PROT 6.1* 6.0*  ALBUMIN 3.2* 3.2*   Recent Labs  Lab 08/15/18 1527  LIPASE 31   No results for input(s): AMMONIA in the last 168 hours. Coagulation Profile: No results for input(s): INR, PROTIME in the last 168 hours. Cardiac Enzymes: Recent Labs  Lab 08/15/18 2218 08/16/18 0418 08/16/18 1004  TROPONINI 1.86* 1.96* 1.95*   BNP (last 3 results) No results for input(s): PROBNP in the last 8760 hours. HbA1C: No results for input(s): HGBA1C in the last 72 hours. CBG: Recent Labs  Lab 08/18/18 1141 08/18/18 1638 08/18/18 2049 08/19/18 0742 08/19/18 1217  GLUCAP 183* 259* 161* 155* 160*   Lipid Profile: No results for input(s): CHOL, HDL, LDLCALC, TRIG, CHOLHDL, LDLDIRECT in the last 72 hours. Thyroid Function Tests: No results for input(s): TSH, T4TOTAL, FREET4, T3FREE, THYROIDAB in the  last 72 hours. Anemia Panel: No results for input(s): VITAMINB12, FOLATE, FERRITIN, TIBC, IRON, RETICCTPCT in the last 72 hours. Urine analysis:    Component Value Date/Time   COLORURINE YELLOW 08/15/2018 1616   APPEARANCEUR HAZY (A) 08/15/2018 1616   LABSPEC 1.018 08/15/2018 1616   PHURINE 5.0 08/15/2018 1616   GLUCOSEU NEGATIVE 08/15/2018 1616   HGBUR NEGATIVE 08/15/2018 1616   BILIRUBINUR NEGATIVE 08/15/2018 1616   KETONESUR NEGATIVE 08/15/2018 1616   PROTEINUR 100 (A) 08/15/2018 1616   NITRITE NEGATIVE 08/15/2018 1616   LEUKOCYTESUR NEGATIVE 08/15/2018 1616   Sepsis Labs: Invalid input(s): PROCALCITONIN, LACTICIDVEN  No results found for this or any previous visit (from the past 240 hour(s)).    Radiology Studies: No results found.   Scheduled Meds: . [START ON 08/20/2018] aspirin EC  81 mg Oral Daily  . atorvastatin  40 mg Oral q1800  . carvedilol  6.25 mg Oral BID WC  . clomiPHENE  25 mg Oral Daily  . furosemide  40 mg Intravenous BID  .  insulin aspart  0-9 Units Subcutaneous TID WC  . omega-3 acid ethyl esters  1 g Oral QHS  . sertraline  25 mg Oral Daily  . sodium chloride flush  3 mL Intravenous Q12H   Continuous Infusions: . sodium chloride    . sodium chloride    . heparin      Pamella Pertostin Jennise Both, MD, PhD [facility] Pager [phone]  If 7PM-7AM, please contact night-coverage [email or web address] Password [email or web address] 08/19/2018, 1:32 PM

## 2018-08-19 NOTE — Progress Notes (Signed)
Inpatient Diabetes Program Recommendations  AACE/ADA: New Consensus Statement on Inpatient Glycemic Control (2015)  Target Ranges:  Prepandial:   less than 140 mg/dL      Peak postprandial:   less than 180 mg/dL (1-2 hours)      Critically ill patients:  140 - 180 mg/dL   Lab Results  Component Value Date   GLUCAP 155 (H) 08/19/2018    Review of Glycemic Control Results for Jose RasmussenSCONTRIAS, Kylil L (MRN [id number]) as of 08/19/2018 11:41  Ref. Range 08/18/2018 11:41 08/18/2018 16:38 08/18/2018 20:49 08/19/2018 07:42  Glucose-Capillary Latest Ref Range: 70 - 99 mg/dL 409183 (H) 811259 (H) 914161 (H) 155 (H)   Diabetes history: Type 2 DM  Outpatient Diabetes medications: Amaryl 4 mg bid, Metformin 500 mg tid Current orders for Inpatient glycemic control:  Novolog sensitive tid with meals  Inpatient Diabetes Program Recommendations:    Consider adding Novolog 3 units tid with meals to cover meal intake while oral agents are held in the hospital and Novolog 0-5 units QHS.  Also please consider checking A1C while in the hospital.  Thanks, Lujean RaveLauren Kidus Delman, MSN, RNC-OB Diabetes Coordinator [phone] (8a-5p)

## 2018-08-19 NOTE — Progress Notes (Addendum)
ANTICOAGULATION CONSULT NOTE   Pharmacy Consult:  Heparin Indication: chest pain/ACS  No Known Allergies  Patient Measurements: Height: 5\' 9"  (175.3 cm) Weight: 206 lb 14.4 oz (93.8 kg) IBW/kg (Calculated) : 70.7 Heparin Dosing Weight: 90 kg  Vital Signs: Temp: 98.2 F (36.8 C) (08/22 0515) Temp Source: Oral (08/22 0515) BP: 138/96 (08/22 0515) Pulse Rate: 93 (08/22 0515)  Labs: Recent Labs    08/16/18 1004  08/17/18 0431 08/18/18 0422 08/18/18 0914 08/19/18 0442  HGB  --    < > 12.8* 14.1  --  13.6  HCT  --   --  39.0 42.6  --  40.9  PLT  --   --  190 222  --  204  HEPARINUNFRC  --    < > 0.38 0.39  --  0.40  CREATININE  --   --  1.92*  --  1.78* 1.63*  TROPONINI 1.95*  --   --   --   --   --    < > = values in this interval not displayed.    Estimated Creatinine Clearance: 51.7 mL/min (A) (by C-G formula based on SCr of 1.63 mg/dL (H)).   Assessment: [age] YOM presents with 9 days of abdominal pain and increasing distension. EKG showing anterior infarct (old) and minimal ST depression (inferior leads).  Pharmacy has been consulted to dose IV heparin for ACS.  Heparin level is therapeutic and stable; no bleeding reported.   Goal of Therapy:  Heparin level 0.3-0.7 units/ml Monitor platelets by anticoagulation protocol: Yes    Plan:  Continue heparin gtt at 1500 units/hr Daily heparin level and CBC F/U post cath Consider increasing SSI for better glycemic control   Hersel Mcmeen D. Laney Potashang, PharmD, BCPS, BCCCP 08/19/2018, 7:42 AM   =============================================   Addendum: S/p cath, to resume IV heparin 8 hours post sheath removal for LV thrombus Sheath removed around 1115, TR band still in place but to be removed soon per RN Await CVTS eval  Plan: At 1915, restart heparin gtt at 1500 units/hr Check 6 hr heparin level Daily heparin level and CBC Monitor for bleeding/hematoma   Maree Ainley D. Laney Potashang, PharmD, BCPS, BCCCP 08/19/2018, 1:22 PM

## 2018-08-19 NOTE — Plan of Care (Signed)
  Problem: Clinical Measurements: Goal: Ability to maintain clinical measurements within normal limits will improve Outcome: Progressing   Problem: Skin Integrity: Goal: Risk for impaired skin integrity will decrease Outcome: Progressing   Problem: Cardiac: Goal: Ability to achieve and maintain adequate cardiopulmonary perfusion will improve Outcome: Progressing

## 2018-08-19 NOTE — Progress Notes (Signed)
TR band removed.

## 2018-08-20 ENCOUNTER — Inpatient Hospital Stay (HOSPITAL_COMMUNITY): Payer: BLUE CROSS/BLUE SHIELD

## 2018-08-20 ENCOUNTER — Inpatient Hospital Stay (HOSPITAL_COMMUNITY): Payer: BLUE CROSS/BLUE SHIELD | Admitting: Certified Registered Nurse Anesthetist

## 2018-08-20 DIAGNOSIS — I361 Nonrheumatic tricuspid (valve) insufficiency: Secondary | ICD-10-CM

## 2018-08-20 DIAGNOSIS — I469 Cardiac arrest, cause unspecified: Secondary | ICD-10-CM | POA: Diagnosis present

## 2018-08-20 DIAGNOSIS — I472 Ventricular tachycardia: Secondary | ICD-10-CM

## 2018-08-20 LAB — PULMONARY FUNCTION TEST
FEF 25-75 POST: 1.64 L/s
FEF 25-75 Pre: 1.84 L/sec
FEF2575-%Change-Post: -10 %
FEF2575-%PRED-PRE: 68 %
FEF2575-%Pred-Post: 61 %
FEV1-%Change-Post: -3 %
FEV1-%PRED-PRE: 62 %
FEV1-%Pred-Post: 60 %
FEV1-PRE: 2.08 L
FEV1-Post: 2.02 L
FEV1FVC-%CHANGE-POST: 1 %
FEV1FVC-%Pred-Pre: 105 %
FEV6-%CHANGE-POST: -4 %
FEV6-%PRED-PRE: 62 %
FEV6-%Pred-Post: 59 %
FEV6-PRE: 2.63 L
FEV6-Post: 2.52 L
FEV6FVC-%PRED-PRE: 105 %
FEV6FVC-%Pred-Post: 105 %
FVC-%Change-Post: -4 %
FVC-%Pred-Post: 56 %
FVC-%Pred-Pre: 58 %
FVC-Post: 2.52 L
FVC-Pre: 2.63 L
POST FEV1/FVC RATIO: 80 %
POST FEV6/FVC RATIO: 100 %
Pre FEV1/FVC ratio: 79 %
Pre FEV6/FVC Ratio: 100 %

## 2018-08-20 LAB — GLUCOSE, CAPILLARY
GLUCOSE-CAPILLARY: 330 mg/dL — AB (ref 70–99)
GLUCOSE-CAPILLARY: 326 mg/dL — AB (ref 70–99)
GLUCOSE-CAPILLARY: 241 mg/dL — AB (ref 70–99)
Glucose-Capillary: 201 mg/dL — ABNORMAL HIGH (ref 70–99)
Glucose-Capillary: 223 mg/dL — ABNORMAL HIGH (ref 70–99)

## 2018-08-20 LAB — LACTIC ACID, PLASMA
LACTIC ACID, VENOUS: 8.6 mmol/L — AB (ref 0.5–1.9)
LACTIC ACID, VENOUS: 2.9 mmol/L — AB (ref 0.5–1.9)
Lactic Acid, Venous: 2.1 mmol/L (ref 0.5–1.9)

## 2018-08-20 LAB — ECHOCARDIOGRAM COMPLETE
HEIGHTINCHES: 69 in
WEIGHTICAEL: 3286.4 [oz_av]

## 2018-08-20 LAB — MAGNESIUM: Magnesium: 2.6 mg/dL — ABNORMAL HIGH (ref 1.7–2.4)

## 2018-08-20 LAB — COMPREHENSIVE METABOLIC PANEL
ALT: 55 U/L — AB (ref 0–44)
ANION GAP: 19 — AB (ref 5–15)
AST: 81 U/L — ABNORMAL HIGH (ref 15–41)
Albumin: 2.8 g/dL — ABNORMAL LOW (ref 3.5–5.0)
Alkaline Phosphatase: 43 U/L (ref 38–126)
BILIRUBIN TOTAL: 1.5 mg/dL — AB (ref 0.3–1.2)
BUN: 21 mg/dL (ref 8–23)
CO2: 14 mmol/L — ABNORMAL LOW (ref 22–32)
Calcium: 8 mg/dL — ABNORMAL LOW (ref 8.9–10.3)
Chloride: 105 mmol/L (ref 98–111)
Creatinine, Ser: 2.01 mg/dL — ABNORMAL HIGH (ref 0.61–1.24)
GFR, EST AFRICAN AMERICAN: 39 mL/min — AB (ref >60)
GFR, EST NON AFRICAN AMERICAN: 33 mL/min — AB (ref >60)
Glucose, Bld: 374 mg/dL — ABNORMAL HIGH (ref 70–99)
POTASSIUM: 4.7 mmol/L (ref 3.5–5.1)
SODIUM: 138 mmol/L (ref 135–145)
TOTAL PROTEIN: 5.5 g/dL — AB (ref 6.5–8.1)

## 2018-08-20 LAB — CBC
HCT: 40.2 % (ref 39.0–52.0)
HCT: 43.7 % (ref 39.0–52.0)
HEMOGLOBIN: 13.7 g/dL (ref 13.0–17.0)
Hemoglobin: 13.3 g/dL (ref 13.0–17.0)
MCH: 28.2 pg (ref 26.0–34.0)
MCH: 28.5 pg (ref 26.0–34.0)
MCHC: 33.1 g/dL (ref 30.0–36.0)
MCHC: 31.4 g/dL (ref 30.0–36.0)
MCV: 85.4 fL (ref 78.0–100.0)
MCV: 90.9 fL (ref 78.0–100.0)
PLATELETS: 196 10*3/uL (ref 150–400)
PLATELETS: 266 10*3/uL (ref 150–400)
RBC: 4.71 MIL/uL (ref 4.22–5.81)
RBC: 4.81 MIL/uL (ref 4.22–5.81)
RDW: 13.7 % (ref 11.5–15.5)
RDW: 14 % (ref 11.5–15.5)
WBC: 9.7 10*3/uL (ref 4.0–10.5)
WBC: 14.6 10*3/uL — AB (ref 4.0–10.5)

## 2018-08-20 LAB — POCT I-STAT 3, ART BLOOD GAS (G3+)
ACID-BASE DEFICIT: 13 mmol/L — AB (ref 0.0–2.0)
Acid-base deficit: 3 mmol/L — ABNORMAL HIGH (ref 0.0–2.0)
Bicarbonate: 15 mmol/L — ABNORMAL LOW (ref 20.0–28.0)
Bicarbonate: 22.2 mmol/L (ref 20.0–28.0)
O2 SAT: 90 %
O2 SAT: 99 %
PCO2 ART: 40.4 mmHg (ref 32.0–48.0)
PH ART: 7.349 — AB (ref 7.350–7.450)
PO2 ART: 74 mmHg — AB (ref 83.0–108.0)
Patient temperature: 98.8
TCO2: 16 mmol/L — ABNORMAL LOW (ref 22–32)
TCO2: 23 mmol/L (ref 22–32)
pCO2 arterial: 41.6 mmHg (ref 32.0–48.0)
pH, Arterial: 7.164 — CL (ref 7.350–7.450)
pO2, Arterial: 130 mmHg — ABNORMAL HIGH (ref 83.0–108.0)

## 2018-08-20 LAB — PHOSPHORUS: PHOSPHORUS: 5.6 mg/dL — AB (ref 2.5–4.6)

## 2018-08-20 LAB — BASIC METABOLIC PANEL
Anion gap: 8 (ref 5–15)
BUN: 20 mg/dL (ref 8–23)
CO2: 23 mmol/L (ref 22–32)
CREATININE: 1.61 mg/dL — AB (ref 0.61–1.24)
Calcium: 8.4 mg/dL — ABNORMAL LOW (ref 8.9–10.3)
Chloride: 106 mmol/L (ref 98–111)
GFR calc non Af Amer: 44 mL/min — ABNORMAL LOW (ref >60)
GFR, EST AFRICAN AMERICAN: 51 mL/min — AB (ref >60)
Glucose, Bld: 236 mg/dL — ABNORMAL HIGH (ref 70–99)
Potassium: 3.6 mmol/L (ref 3.5–5.1)
SODIUM: 137 mmol/L (ref 135–145)

## 2018-08-20 LAB — HEPARIN LEVEL (UNFRACTIONATED)
HEPARIN UNFRACTIONATED: 0.22 [IU]/mL — AB (ref 0.30–0.70)
HEPARIN UNFRACTIONATED: 0.43 [IU]/mL (ref 0.30–0.70)
Heparin Unfractionated: 0.31 IU/mL (ref 0.30–0.70)

## 2018-08-20 LAB — PROCALCITONIN: Procalcitonin: <0.1 ng/mL

## 2018-08-20 LAB — COOXEMETRY PANEL
Carboxyhemoglobin: 1.3 % (ref 0.5–1.5)
Methemoglobin: 0.9 % (ref 0.0–1.5)
O2 Saturation: 76.4 %
TOTAL HEMOGLOBIN: 13.6 g/dL (ref 12.0–16.0)

## 2018-08-20 LAB — TROPONIN I
TROPONIN I: 0.75 ng/mL — AB (ref <0.03)
TROPONIN I: 1.74 ng/mL — AB (ref <0.03)

## 2018-08-20 LAB — SURGICAL PCR SCREEN
MRSA, PCR: NEGATIVE
Staphylococcus aureus: NEGATIVE

## 2018-08-20 LAB — BRAIN NATRIURETIC PEPTIDE: B NATRIURETIC PEPTIDE 5: 1184 pg/mL — AB (ref 0.0–100.0)

## 2018-08-20 MED ORDER — AMIODARONE HCL IN DEXTROSE 360-4.14 MG/200ML-% IV SOLN
30.0000 mg/h | INTRAVENOUS | Status: DC
  Administered 2018-08-20 – 2018-08-23 (×6): 30 mg/h via INTRAVENOUS
  Filled 2018-08-20 (×7): qty 200

## 2018-08-20 MED ORDER — ALBUTEROL SULFATE (2.5 MG/3ML) 0.083% IN NEBU
2.5000 mg | INHALATION_SOLUTION | Freq: Once | RESPIRATORY_TRACT | Status: AC
  Administered 2018-08-20: 2.5 mg via RESPIRATORY_TRACT

## 2018-08-20 MED ORDER — FENTANYL CITRATE (PF) 100 MCG/2ML IJ SOLN
50.0000 ug | Freq: Once | INTRAMUSCULAR | Status: AC
  Administered 2018-08-20: 50 ug via INTRAVENOUS

## 2018-08-20 MED ORDER — ORAL CARE MOUTH RINSE
15.0000 mL | OROMUCOSAL | Status: DC
  Administered 2018-08-20 – 2018-08-21 (×11): 15 mL via OROMUCOSAL

## 2018-08-20 MED ORDER — FAMOTIDINE IN NACL 20-0.9 MG/50ML-% IV SOLN
20.0000 mg | INTRAVENOUS | Status: DC
  Administered 2018-08-20 – 2018-08-21 (×2): 20 mg via INTRAVENOUS
  Filled 2018-08-20 (×2): qty 50

## 2018-08-20 MED ORDER — SODIUM BICARBONATE 8.4 % IV SOLN
50.0000 meq | Freq: Once | INTRAVENOUS | Status: AC
  Administered 2018-08-20: 50 meq via INTRAVENOUS

## 2018-08-20 MED ORDER — MIDAZOLAM HCL 2 MG/2ML IJ SOLN: 2.0000 mg | INTRAMUSCULAR | Status: DC | PRN

## 2018-08-20 MED ORDER — CHLORHEXIDINE GLUCONATE 0.12% ORAL RINSE (MEDLINE KIT)
15.0000 mL | Freq: Two times a day (BID) | OROMUCOSAL | Status: DC
  Administered 2018-08-20 – 2018-08-21 (×3): 15 mL via OROMUCOSAL

## 2018-08-20 MED ORDER — NOREPINEPHRINE 4 MG/250ML-% IV SOLN
0.0000 ug/min | INTRAVENOUS | Status: DC
  Filled 2018-08-20: qty 250

## 2018-08-20 MED ORDER — SODIUM CHLORIDE 0.9 % IV BOLUS
250.0000 mL | Freq: Once | INTRAVENOUS | Status: AC
  Administered 2018-08-20: 250 mL via INTRAVENOUS

## 2018-08-20 MED ORDER — SODIUM BICARBONATE 8.4 % IV SOLN
INTRAVENOUS | Status: AC
  Administered 2018-08-20: 50 meq via INTRAVENOUS
  Filled 2018-08-20: qty 100

## 2018-08-20 MED ORDER — AMIODARONE HCL IN DEXTROSE 360-4.14 MG/200ML-% IV SOLN
60.0000 mg/h | INTRAVENOUS | Status: AC
  Administered 2018-08-20: 60 mg/h via INTRAVENOUS
  Filled 2018-08-20: qty 200

## 2018-08-20 MED ORDER — NOREPINEPHRINE 16 MG/250ML-% IV SOLN
0.0000 ug/min | INTRAVENOUS | Status: DC
  Administered 2018-08-20: 2 ug/min via INTRAVENOUS
  Filled 2018-08-20 (×2): qty 250

## 2018-08-20 MED ORDER — FENTANYL BOLUS VIA INFUSION
50.0000 ug | INTRAVENOUS | Status: DC | PRN
  Filled 2018-08-20: qty 50

## 2018-08-20 MED ORDER — SUCCINYLCHOLINE CHLORIDE 20 MG/ML IJ SOLN
INTRAMUSCULAR | Status: DC | PRN
  Administered 2018-08-20: 100 mg via INTRAVENOUS

## 2018-08-20 MED ORDER — FENTANYL 2500MCG IN NS 250ML (10MCG/ML) PREMIX INFUSION
25.0000 ug/h | INTRAVENOUS | Status: DC
  Administered 2018-08-20: 200 ug/h via INTRAVENOUS
  Administered 2018-08-20: 50 ug/h via INTRAVENOUS
  Administered 2018-08-21: 200 ug/h via INTRAVENOUS
  Filled 2018-08-20 (×3): qty 250

## 2018-08-20 MED ORDER — AMIODARONE HCL IN DEXTROSE 360-4.14 MG/200ML-% IV SOLN
INTRAVENOUS | Status: AC
  Filled 2018-08-20: qty 200

## 2018-08-20 MED ORDER — AMIODARONE HCL IN DEXTROSE 360-4.14 MG/200ML-% IV SOLN: 60.0000 mg/h | INTRAVENOUS | Status: DC

## 2018-08-20 MED ORDER — DOCUSATE SODIUM 50 MG/5ML PO LIQD: 100.0000 mg | Freq: Two times a day (BID) | ORAL | Status: DC | PRN

## 2018-08-20 MED ORDER — FUROSEMIDE 10 MG/ML IJ SOLN
20.0000 mg | Freq: Once | INTRAMUSCULAR | Status: AC
  Administered 2018-08-20: 20 mg via INTRAVENOUS
  Filled 2018-08-20: qty 2

## 2018-08-20 MED ORDER — MUPIROCIN 2 % EX OINT
1.0000 "application " | TOPICAL_OINTMENT | Freq: Two times a day (BID) | CUTANEOUS | Status: DC
  Administered 2018-08-20: 1 via NASAL
  Filled 2018-08-20 (×2): qty 22

## 2018-08-20 MED ORDER — MIDAZOLAM HCL 2 MG/2ML IJ SOLN
2.0000 mg | Freq: Once | INTRAMUSCULAR | Status: AC
  Administered 2018-08-20: 2 mg via INTRAVENOUS

## 2018-08-20 MED ORDER — ETOMIDATE 2 MG/ML IV SOLN
INTRAVENOUS | Status: DC | PRN
  Administered 2018-08-20: 10 mg via INTRAVENOUS

## 2018-08-20 MED ORDER — AMIODARONE HCL IN DEXTROSE 360-4.14 MG/200ML-% IV SOLN
INTRAVENOUS | Status: AC
  Administered 2018-08-20: 60 mg/h
  Filled 2018-08-20: qty 200

## 2018-08-20 MED ORDER — INSULIN ASPART 100 UNIT/ML ~~LOC~~ SOLN
0.0000 [IU] | SUBCUTANEOUS | Status: DC
  Administered 2018-08-20: 7 [IU] via SUBCUTANEOUS
  Administered 2018-08-20 (×2): 3 [IU] via SUBCUTANEOUS
  Administered 2018-08-20: 7 [IU] via SUBCUTANEOUS
  Administered 2018-08-21 (×3): 3 [IU] via SUBCUTANEOUS
  Administered 2018-08-21: 5 [IU] via SUBCUTANEOUS
  Administered 2018-08-21 – 2018-08-22 (×2): 3 [IU] via SUBCUTANEOUS
  Administered 2018-08-22: 5 [IU] via SUBCUTANEOUS
  Administered 2018-08-22 (×2): 3 [IU] via SUBCUTANEOUS
  Administered 2018-08-22: 2 [IU] via SUBCUTANEOUS
  Administered 2018-08-22 – 2018-08-23 (×2): 3 [IU] via SUBCUTANEOUS
  Administered 2018-08-23: 2 [IU] via SUBCUTANEOUS
  Administered 2018-08-23: 3 [IU] via SUBCUTANEOUS
  Administered 2018-08-23: 2 [IU] via SUBCUTANEOUS
  Administered 2018-08-23 (×2): 3 [IU] via SUBCUTANEOUS
  Administered 2018-08-24: 2 [IU] via SUBCUTANEOUS
  Administered 2018-08-24: 1 [IU] via SUBCUTANEOUS
  Administered 2018-08-24: 2 [IU] via SUBCUTANEOUS

## 2018-08-20 MED FILL — Verapamil HCl IV Soln 2.5 MG/ML: INTRAVENOUS | Qty: 2 | Status: AC

## 2018-08-20 MED FILL — Medication: Qty: 1 | Status: AC

## 2018-08-20 NOTE — Progress Notes (Addendum)
TCTS BRIEF SICU PROGRESS NOTE  1 Day Post-Op  S/P Procedure(s) (LRB): CORONARY ANGIOGRAPHY (CATH LAB) (N/A) RIGHT HEART CATH (N/A)   Events of this morning noted.  It sounds as though cardiac arrest was due to primary arrhythmia. By report LV mural thrombus was no longer seen on f/u ECHO performed this afternoon   Patient starting to wake up on vent NSR w/ BP 80-100 systolic on low dose levophed Extremities cool but adequately perfused UOP 20-30 mL/hr  Impression: I don't think CABG should be considered an option at this time.  If patient recovers from his arrest it might be reasonable to consider high-risk PCI of left main and ostial LCx stenosis.  Will defer plans to cardiology team.  Discussed at length with patient's wife and family at bedside.  All questions answered.   I spent in excess of 30 minutes during the conduct of this hospital encounter and >50% of this time involved direct face-to-face encounter with the patient for counseling and/or coordination of their care.   Purcell Nailslarence H Kimmy Totten, MD 08/20/2018 6:01 PM

## 2018-08-20 NOTE — Procedures (Signed)
Central Venous Catheter Insertion Procedure Note Jose Myers [id number] Jun 30, 1956  Procedure: Insertion of Central Venous Catheter Indications: Assessment of intravascular volume, Drug and/or fluid administration and Frequent blood sampling  Procedure Details Consent: Risks of procedure as well as the alternatives and risks of each were explained to the (patient/caregiver).  Consent for procedure obtained. Time Out: Verified patient identification, verified procedure, site/side was marked, verified correct patient position, special equipment/implants available, medications/allergies/relevent history reviewed, required imaging and test results available.  Performed  Maximum sterile technique was used including antiseptics, cap, gloves, gown, hand hygiene, mask and sheet. Skin prep: Chlorhexidine; local anesthetic administered A antimicrobial bonded/coated triple lumen catheter was placed in the left internal jugular vein using the Seldinger technique. Ultrasound guidance used.Yes.   Catheter placed to 20 cm. Blood aspirated via all 3 ports and then flushed x 3. Line sutured x 2 and dressing applied.   [facility] Evaluation Blood flow good Complications: No apparent complications Patient did tolerate procedure well. Chest X-ray ordered to verify placement.  CXR: pending.  Brett CanalesSteve Minor ACNP [facility]ackLe [facility] P[facility]M Pager [phone] till 1 pm If no answer page [phone] 08/20/2018, 12:14 PM

## 2018-08-20 NOTE — Progress Notes (Signed)
10:20am: CCMD notified CN regarding VFib. Staff opened door and pt was standing and suddenly fell to the floor. Code blue called. Patient was having agonal breathing. Patient was assisted to bed and CPR started and one shock given. MD at bedside along with code blue team.

## 2018-08-20 NOTE — Progress Notes (Signed)
CCM Simpson, NP notified of decreasing urine output as well as EKG results of long QTc. Questioning stability of patient for MRA.   Spoke with on-call cardiology PA in regards to CCM being unsure that patient is stable enough for MRA.   Cornelius Moraswen, MD at bedside to notify family that patient will not be surgical candidate.   Will continue to monitor.

## 2018-08-20 NOTE — Progress Notes (Signed)
Initial Nutrition Assessment  DOCUMENTATION CODES:   Obesity unspecified  INTERVENTION:   If unable to extubate, recommend intiation of enteral nutrition: - Vital High Protein @ 30 ml/hr (720 ml/day) - 60 ml Pro-stat TID  Tube feeding regimen provides 1320 kcal, 153 grams of protein, and 605 ml of H2O (100% of needs)  NUTRITION DIAGNOSIS:   Inadequate oral intake related to inability to eat as evidenced by NPO status.  GOAL:   Provide needs based on ASPEN/SCCM guidelines  MONITOR:   Weight trends, I & O's, Labs, PO intake, Vent status  REASON FOR ASSESSMENT:   Consult Assessment of nutrition requirement/status  ASSESSMENT:   62 year old male who presented to the ED on 8/18 with abdominal pain and leg swelling. PMH significant for hypertension, CAD s/p stenting, and type 2 diabetes mellitus. Pt found to have new onset CHF and non-STEMI.   8/22 - s/p cardiac cath showing severe multivessel disease, cardiothoracic surgery consulted 8/23 - VT/VF cardiac arrest, pt intubated and transferred to ICU  Discussed pt with RN.  Patient is currently intubated on ventilator support MVe: 9.9 L/min Temp (24hrs), Avg:98 F (36.7 C), Min:97.4 F (36.3 C), Max:98.8 F (37.1 C) BP: 98/87 MAP: 86  Propofol: none Fentanyl: 20 ml/hr Amiodarone: 33.3 ml/hr  Per critical care note, plan to consider initiation of TF tomorrow. RD to leave TF recommendations.  Pt with negative fluid balance since admission. Suspect pt was fluid overloaded on admission. Will follow for weight changes and change estimated needs if appropriate. Per weight history in chart, pt's weight appears to have remained stable since April 2018.  Discussed potential to initiate tube feedings with pt's family. All questions answered.  Meal completion prior to intubation: 75-100%  Medications reviewed and include: sliding scale Novolog q 4 hours, omega-3 acid ethyl esters daily, IV pepcid  Labs reviewed: elevated  LFTs CBG's: 201, 245, 173 x 24 hours  UOP: 2525 ml x 24 hours I/O's: -5.5 L since admission  NUTRITION - FOCUSED PHYSICAL EXAM:  Deferred at this time due to large family presence in room.  Diet Order:   Diet Order            Diet NPO time specified  Diet effective now              EDUCATION NEEDS:   No education needs have been identified at this time  Skin:  Skin Assessment: Reviewed RN Assessment  Last BM:  08/19/18  Height:   Ht Readings from Last 1 Encounters:  08/15/18 5\' 9"  (1.753 m)    Weight:   Wt Readings from Last 1 Encounters:  08/20/18 93.2 kg    Ideal Body Weight:  72.73 kg  BMI:  Body mass index is 30.33 kg/m.  Estimated Nutritional Needs:   Kcal:  [id number]  Protein:  >/= 145 grams  Fluid:  >/= 1.5 L    Earma ReadingKate Jablonski Praise Stennett, MS, RD, LDN Pager: [phone] Weekend/After Hours: [phone]

## 2018-08-20 NOTE — Progress Notes (Signed)
  Echocardiogram 2D Echocardiogram has been performed.  Jose Myers T [name] 08/20/2018, 1:35 PM

## 2018-08-20 NOTE — Anesthesia Procedure Notes (Signed)
Procedure Name: Intubation Date/Time: 08/20/2018 10:37 AM Performed by: White, Cordella RegisterKelsey Tena Cortney Mckinney, CRNA Pre-anesthesia Checklist: Patient identified, Emergency Drugs available, Suction available and Patient being monitored Patient Re-evaluated:Patient Re-evaluated prior to induction Oxygen Delivery Method: Circle System Utilized Preoxygenation: Pre-oxygenation with 100% oxygen Induction Type: IV induction and Rapid sequence Ventilation: Mask ventilation without difficulty Laryngoscope Size: McGraph and 4 Grade View: Grade I Tube type: Subglottic suction tube Tube size: 7.5 mm Number of attempts: 1 Airway Equipment and Method: Stylet Placement Confirmation: positive ETCO2,  breath sounds checked- equal and bilateral,  CO2 detector and ETT inserted through vocal cords under direct vision (ETCO2 connected to zole) Secured at: 23 cm Tube secured with: Tape Dental Injury: Teeth and Oropharynx as per pre-operative assessment

## 2018-08-20 NOTE — Progress Notes (Signed)
ABG results reported to CCM NP.

## 2018-08-20 NOTE — Progress Notes (Signed)
While in room obtaining ABG, pt began to vomit.  RN at bedside, pt was turned on side and RN suctioned orally and ETT.  Per RN, does not appear to have aspirated.

## 2018-08-20 NOTE — Consult Note (Signed)
Jose Myers  ZOX:[id number] DOB: 14-Jun-1956 DOA: 08/15/2018 PCP: Daisy Floro, MD    Reason for Consult / Chief Complaint:  Post A fib, V tach Arrest NSTEMI  Consulting MD:  Cristal Deer  HPI/Brief Narrative   62 year old male admitted 08/15/2018 with history of diabetes and HTN who ruled in for non-STEMI, , presenting with systolic congestive heart failure and acute renal insufficiency  who had been medically stabilized. His cath 8/21 revealed severe three-vessel coronary artery disease and ejection fraction on echocardiogram is impaired at 25 to 30% and an LV thrombus. Heparin was started.   He appeared to be a suitable candidate to proceed with coronary artery surgical revascularization, which was scheduled for 08/23/2018 . He  went into v-fib/ v-tach arrest on 08/20/2018 .  He was resuscitated with  CPR, he received epi x 2 and was defibrillated x 1. Total code time was 16 minutes with 6 minutes of ROSC in between. He was given Mag 2 grams, and bolused  with amiodarone and started on gtt.  Creatinine was  showing improvement over time, baseline is unknown. PCCM was asked to consult for Acute Respiratory failure and medical management.      Assessment & Plan:  V-tach V-fib Arrest S/P NSTEMI  2 minute down time Defib x 1 Hx. CHF, CAD Known LV thrombus 3.3 cm (L) x 1.8 cm (W), spherical, fixed, apical anteriorthrombusassociated with an akinetic segment.   Cath 08/19/2018 with severe multi vessel CAD including LM 85% Troponin 0.75 8/23>> Pulmonary Edema per CXR Plan: Tele  Amio gtt, 60 mg, then 30 mg EEG now Echo now CVP now >> if elevated consider Lasix 20 ( Dye load 8/22 with cath/ Creat 2.01) Levophed to maintain MAP > 65 250 cc bolus now for low BP Co-ox once CVC  placed Trend troponins Trend BNP Trend Lactate Check Mag Check Phos  Acute Respiratory Failure Post Cardiac Arrest Metabolic Acidosis Post Arrest Assisting vent ? PE   Plan: Full vent support  for now>> 8 cc/ KG CXR now  Increase PEEP to 10 ABG in 2 hours 2 amps HCO3 now CXR 8/24 Titrate oxygen and PEEP to maintain sats > 94% Consider Sputum Culture if CXR indicates  aspiration or PCT elevated Continue Heparin  Acute Renal Insufficiency Bump to Creatinine of 2.01 overnight Net negative 5 L since admission Dye Load 8/22 with cath at risk for worsening with critical illness Plan Monitor UO Place Foley Trend CMET Replete electrolytes as needed Avoid nephrotoxic medications Maintain renal perfusion with a MAP goal > 65  Infection  Leukocytosis Afebrile Risk for aspiration Plan: Trend WBC Trend fever curve Culture as is clinically indicated  Endocrine History of DM ACHS insulin at home Plan: Hyperglycemic protocol CBG Q 4 Sensitive coverage   GI OG placed Elevated LFT's Plan: Trend CMET SUP with renal dose pepcid Consider TF 8/24 am Nutritional consult 8/23  Neuro Purposeful movement x 4 extremities Sedated Plan: RASS Goal -1 to -2  Pain and agitation sedation Neuro checks per unit protocol   Best practice/Goals of care/disposition.   DVT prophylaxis: Heparin gtt GI prophylaxis: Renal dose pepcid Diet: NPO Mobility:Bedrest Code Status: Full Code Family Communication:  Family Updated by Cards and [facility] 8/23  Disposition / Summary of Today's Plan 08/20/18   ICU Echo pending Full vent support for now  Consultants:  Pulmonary TCTS Cards Primary  Procedures: 08/20/2018>> ETT 08/20/2018>> CVC L IJ   Significant Diagnostic Tests: 8/21>>  Cath: Severe multivessel CAD  including left main.  Distal left main 85%.  Diffusely diseased and totally occluded LAD in the mid segment.  Proximal 80 to 99% obstruction.  Diagonals are totally occluded.  80% ostial circumflex, 90% proximal first obtuse marginal, and total occlusion of the circumflex beyond the first marginal.  Diffuse disease in the first marginal.  Dominant right coronary with  segmental 70% distal stenosis, 85% distal RCA beyond the PDA but before the first large left ventricular branch.  Diffuse disease in the proximal to midportion of the large right ventricular branch and the second posterolateral branch.  Left ventriculography and hemodynamic recordings were not performed because of LV apical thrombus.  Echo 8/21 EF 25-30%, Mild LVH, Systolic function reduced, diffuse hypokinesis, There was a large, 3.3 cm (L) x 1.8 cm (W), spherical, fixed, apical   anterior thrombus associated with an akinetic segment.  CXR 08/20/2018 Pulmonary Edema  Micro Data: None  Antimicrobials:  None   Subjective  Sedated intubated ill appearing male with purposeful movement noted  Objective    Examination: General: Sedated and intubated obese male HENT: ETT, NCAT, MM pink and moist, No LAD Lungs: Bilateral excursion noted, Rales noted throughout, diminished per bases Cardiovascular: S1, S2, SR with 1 degree block, No RMG Abdomen: Large, soft, non-distended, NT,BS diminished, Obese Extremities: Cool to touch, no obvious deformities, Neuro: MAE x 4, sedated , purposeful movement GU: Foley cath with clear amber urine  Blood pressure (!) 135/103, pulse (!) 102, temperature 98.8 F (37.1 C), temperature source Oral, resp. rate 18, height 5\' 9"  (1.753 m), weight 93.2 kg, SpO2 98 %.        Intake/Output Summary (Last 24 hours) at 08/20/2018 1100 Last data filed at 08/20/2018 0239 Gross per 24 hour  Intake 1025.7 ml  Output 2050 ml  Net -1024.3 ml   Filed Weights   08/18/18 0431 08/19/18 0515 08/20/18 0459  Weight: 94 kg 93.8 kg 93.2 kg     Labs   CBC: Recent Labs  Lab 08/16/18 0418 08/17/18 0431 08/18/18 0422 08/19/18 0442 08/20/18 0257  WBC 7.7 7.1 9.4 9.5 9.7  HGB 14.0 12.8* 14.1 13.6 13.3  HCT 41.5 39.0 42.6 40.9 40.2  MCV 83.8 85.0 85.5 85.0 85.4  PLT 193 190 222 204 196   Basic Metabolic Panel: Recent Labs  Lab 08/15/18 2218 08/16/18 0418  08/16/18 1004 08/17/18 0431 08/18/18 0914 08/19/18 0442 08/20/18 0257  NA  --  138  --  137 138 139 137  K  --  3.3*  --  3.5 3.7 3.5 3.6  CL  --  101  --  106 104 106 106  CO2  --  25  --  24 22 23 23   GLUCOSE  --  141*  --  171* 245* 162* 236*  BUN  --  33*  --  28* 23 23 20   CREATININE  --  2.11*  --  1.92* 1.78* 1.63* 1.61*  CALCIUM  --  8.5*  --  7.9* 8.4* 8.4* 8.4*  MG 2.4  --  2.4  --   --   --   --    GFR: Estimated Creatinine Clearance: 52.3 mL/min (A) (by C-G formula based on SCr of 1.61 mg/dL (H)). Recent Labs  Lab 08/17/18 0431 08/18/18 0422 08/19/18 0442 08/20/18 0257  WBC 7.1 9.4 9.5 9.7   Liver Function Tests: Recent Labs  Lab 08/15/18 1527 08/16/18 0418  AST 25 25  ALT 21 19  ALKPHOS 44 44  BILITOT 1.2  0.9  PROT 6.1* 6.0*  ALBUMIN 3.2* 3.2*   Recent Labs  Lab 08/15/18 1527  LIPASE 31   No results for input(s): AMMONIA in the last 168 hours. ABG    Component Value Date/Time   PHART 7.394 08/19/2018 1132   PCO2ART 31.7 (L) 08/19/2018 1132   PO2ART 64.0 (L) 08/19/2018 1132   HCO3 19.3 (L) 08/19/2018 1132   TCO2 20 (L) 08/19/2018 1132   ACIDBASEDEF 5.0 (H) 08/19/2018 1132   O2SAT 92.0 08/19/2018 1132    Coagulation Profile: No results for input(s): INR, PROTIME in the last 168 hours. Cardiac Enzymes: Recent Labs  Lab 08/15/18 2218 08/16/18 0418 08/16/18 1004  TROPONINI 1.86* 1.96* 1.95*   HbA1C: No results found for: HGBA1C CBG: Recent Labs  Lab 08/19/18 0742 08/19/18 1217 08/19/18 1657 08/19/18 2132 08/20/18 0736  GLUCAP 155* 160* 173* 245* 201*     Review of Systems:   Intubated and sedated: Unable  Past medical history   Past Medical History:  Diagnosis Date  . Coronary artery disease   . Diabetes mellitus without complication (HCC)   . Hypertension    Social History   Social History   Socioeconomic History  . Marital status: Married    Spouse name: Not on file  . Number of children: Not on file  . Years  of education: Not on file  . Highest education level: Not on file  Occupational History  . Not on file  Social Needs  . Financial resource strain: Not on file  . Food insecurity:    Worry: Not on file    Inability: Not on file  . Transportation needs:    Medical: Not on file    Non-medical: Not on file  Tobacco Use  . Smoking status: Never Smoker  . Smokeless tobacco: Never Used  Substance and Sexual Activity  . Alcohol use: Yes    Comment: Occasionally.  . Drug use: Not on file  . Sexual activity: Not on file  Lifestyle  . Physical activity:    Days per week: Not on file    Minutes per session: Not on file  . Stress: Not on file  Relationships  . Social connections:    Talks on phone: Not on file    Gets together: Not on file    Attends religious service: Not on file    Active member of club or organization: Not on file    Attends meetings of clubs or organizations: Not on file    Relationship status: Not on file  . Intimate partner violence:    Fear of current or ex partner: Not on file    Emotionally abused: Not on file    Physically abused: Not on file    Forced sexual activity: Not on file  Other Topics Concern  . Not on file  Social History Narrative  . Not on file    Family history    Family History  Problem Relation Age of Onset  . Diabetes Mellitus II Mother   . Throat cancer Brother     Allergies No Known Allergies  Home meds  Prior to Admission medications   Medication Sig Start Date End Date Taking? Authorizing Provider  Apple Cid Vn-Grn Tea-Bit Or-Cr (APPLE CIDER VINEGAR PLUS) TABS Take 1 tablet by mouth daily.   Yes [provider]  carvedilol (COREG) 6.25 MG tablet Take 6.25 mg by mouth 2 (two) times daily with a meal.   Yes [provider]  clomiPHENE (CLOMID)  50 MG tablet Take 25 mg by mouth daily.   Yes [provider]  glimepiride (AMARYL) 4 MG tablet Take 4 mg by mouth 2 (two) times daily.   Yes [provider]  LORazepam (ATIVAN) 1 MG tablet Take 0.5-1 mg by mouth daily as needed for anxiety.   Yes [provider]  losartan (COZAAR) 100 MG tablet Take 100 mg by mouth daily. 06/13/18  Yes [provider]  metFORMIN (GLUCOPHAGE) 500 MG tablet Take 500 mg by mouth 3 (three) times daily.    Yes [provider]  Omega 3 340 MG CPDR Take 1 capsule by mouth at bedtime.   Yes [provider]  Prenat-FePoly-Fered-FA-Omega 3 (DUET DHA 430 PO) Take 1 capsule by mouth daily.   Yes [provider]  sertraline (ZOLOFT) 50 MG tablet Take 25 mg by mouth daily. 07/13/18  Yes [provider]  traZODone (DESYREL) 50 MG tablet Take 50 mg by mouth at bedtime as needed. 08/13/18  Yes [provider]  verapamil (VERELAN PM) 240 MG 24 hr capsule Take 240 mg by mouth 2 (two) times daily.    Yes [provider]     LOS: 5 days     Bevelyn Ngo, AGACNP-BC [facility] Pulmonary/Critical Care Medicine Pager # [phone]   08/20/2018 11:00 AM

## 2018-08-20 NOTE — Progress Notes (Signed)
ANTICOAGULATION CONSULT NOTE   Pharmacy Consult:  Heparin Indication: chest pain/ACS  No Known Allergies  Patient Measurements: Height: 5\' 9"  (175.3 cm) Weight: 205 lb 6.4 oz (93.2 kg)(scale a) IBW/kg (Calculated) : 70.7 Heparin Dosing Weight: 90 kg  Vital Signs: Temp: 98.8 F (37.1 C) (08/23 0459) Temp Source: Oral (08/23 0459) BP: 98/78 (08/23 1415) Pulse Rate: 84 (08/23 1415)  Labs: Recent Labs    08/19/18 0442 08/20/18 0257 08/20/18 1016 08/20/18 1056  HGB 13.6 13.3  --  13.7  HCT 40.9 40.2  --  43.7  PLT 204 196  --  266  HEPARINUNFRC 0.40 0.22* 0.43  --   CREATININE 1.63* 1.61*  --  2.01*  TROPONINI  --   --   --  0.75*    Estimated Creatinine Clearance: 41.9 mL/min (A) (by C-G formula based on SCr of 2.01 mg/dL (H)).   Assessment: [age] YOM presents with 9 days of abdominal pain and increasing distension. Pt is s/p cath on 8/22 which showed multivessel disease and ECHO showing LV apical thrombus. Pharmacy resumed IV heparin post LHC. Await CVTS eval  HL returned therapeutic on 1600 units/hr; however, at some point in time the heparin drip was turned off as the patient was coding. H/H stable. No bleeding noted by nursing.     Goal of Therapy:  Heparin level 0.3-0.7 units/ml Monitor platelets by anticoagulation protocol: Yes    Plan:  Check 6 hour HL after reinitiation of heparin to confirm dosing Continue to monitor H&H and platelets   Marcelino FreestoneEmily Case Vassell, PharmD PGY2 Cardiology Pharmacy Resident Phone [phone] 08/20/2018 2:37 PM

## 2018-08-20 NOTE — Progress Notes (Signed)
PROGRESS NOTE   Jose Myers YIF:[id number] DOB: [birth year] DOA: 08/15/2018 PCP: Lawerance Cruel, MD   LOS: 5 days   Brief Narrative / Interim history: 62 year old male with history of coronary artery disease with stents in the past, type 2 diabetes mellitus, hypertension, who presents to the hospital with abdominal pain and nausea, initially thought it was due to food poisoning.  He had no diarrhea.  He denies any chest pain.  He is also been complaining of progressive shortness of breath, and on admission he was found to have fluid overload with bilateral lower extremity edema as well as chest x-ray showing pulmonary edema.  His troponin was found to be elevated cardiology was consulted given concern for NSTEMI.  He was started on heparin and IV Lasix. He underwent a cardiac catheterization on 8/23 which showed multivessel disease and cardiothoracic surgery was consulted.   Assessment & Plan: Principal Problem:   NSTEMI (non-ST elevated myocardial infarction) ([place]) Active Problems:   Acute CHF (congestive heart failure) (HCC)   Acute renal failure (ARF) (HCC)   Elevated troponin   Abdominal pain   Type 2 diabetes mellitus with renal manifestations ([place])   Hypervolemia   Cardiac arrest ([place])   Cardiac arrest -patient asymptomatic this morning on rounds awaiting cards input regarding CABG vs other options, 15-20 min after I stepped out of the room code blue was called. On arrival patient had agonal breathing and monitor showed VT. Please see full code sheet documentation, in brief initially responded to shock, lost pulses again and went int PEA. He eventually achieved ROSC. Received Epinephrine, Mg, Amiodarone, was intubated and transferred to ICU.  CC time 70 min 9:30 - 11 am  Acute systolic CHF -likely in the stetting of NSTEMI, improved with diuresis -Cardiac catheterization 8/22 with severe multivessel disease, TCV and cards to discuss further options -Continue  anticoagulation with heparin -2D echo showed an EF of 25 to 30% with diffuse hypokinesis, and a large anterior thrombus associated with an akinetic segment  NSTEMI -Detailed cardiac cath results as below  Acute kidney injury -unclear baseline, Cr stable. Expect Cr to bump after arrest toda  DM -continue SSI per ICU  HTN -now on pressors  HLD -continue statin    DVT prophylaxis: heparin gtt Code Status: Full code Family Communication: d/w wife Disposition Plan: home when ready   Consultants:   Cardiology   Procedures:   2D echo:  Study Conclusions - Left ventricle: The cavity size was normal. Wall thickness was increased in a pattern of mild LVH. Systolic function was severely reduced. The estimated ejection fraction was in the range of 25% to 30%. Diffuse hypokinesis. There is akinesis of the anteroseptal, anterior, and anterolateral myocardium. There was a large, 3.3 cm (L) x 1.8 cm (W), spherical, fixed, apical anteriorthrombusassociated with an akinetic segment. - Aortic valve: Trileaflet; mildly thickened, mildly calcified leaflets. - Mitral valve: There was moderate regurgitation. - Left atrium: The atrium was mildly dilated. - Tricuspid valve: There was mild regurgitation. - Pulmonary arteries: Systolic pressure was moderately increased. PA peak pressure: 59 mm Hg (S).  Cath 8/22   Severe multivessel CAD including left main.  Distal left main 85%.  Diffusely diseased and totally occluded LAD in the mid segment.  Proximal 80 to 99% obstruction.  Diagonals are totally occluded.  80% ostial circumflex, 90% proximal first obtuse marginal, and total occlusion of the circumflex beyond the first marginal.  Diffuse disease in the first marginal.  Dominant right coronary  with segmental 70% distal stenosis, 85% distal RCA beyond the PDA but before the first large left ventricular branch.  Diffuse disease in the proximal to midportion of the large right ventricular branch  and the second posterolateral branch.  Left ventriculography and hemodynamic recordings were not performed because of LV apical thrombus.  Recommendations:   T CTS evaluation for surgical revascularization.  Guideline directed therapy for systolic heart failure: SGLT2 therapy for diabetes, resume ARB but preferably Entresto for decreased LV function, beta-blocker therapy as tolerated, and diuresis as needed for symptom control.  Monitor kidney function closely.  Anticoagulation for acute coronary syndrome and LV thrombus    Antimicrobials:  None    Subjective: -initially asymptomatic, then unresponsive   Objective: Vitals:   08/19/18 1700 08/19/18 2011 08/20/18 0459 08/20/18 1055  BP: (!) 134/94 (!) 132/95 (!) 135/103   Pulse: 86 91 (!) 102   Resp:  18 18   Temp:  (!) 97.4 F (36.3 C) 98.8 F (37.1 C)   TempSrc:  Oral Oral   SpO2: 97% 97% 98% 100%  Weight:   93.2 kg   Height:        Intake/Output Summary (Last 24 hours) at 08/20/2018 1135 Last data filed at 08/20/2018 0239 Gross per 24 hour  Intake 1025.7 ml  Output 2050 ml  Net -1024.3 ml   Filed Weights   08/18/18 0431 08/19/18 0515 08/20/18 0459  Weight: 94 kg 93.8 kg 93.2 kg    Examination:  Constitutional: NAD Eyes: No scleral icterus ENMT: mmm Respiratory: CTA biL Cardiovascular: RRR, no murmurs heard  Abdomen: soft, nt, nd, bs+ Skin: No rashes seen Neurologic: Nonfocal, ambulatory   Data Reviewed: I have independently reviewed following labs and imaging studies   CBC: Recent Labs  Lab 08/17/18 0431 08/18/18 0422 08/19/18 0442 08/20/18 0257 08/20/18 1056  WBC 7.1 9.4 9.5 9.7 14.6*  HGB 12.8* 14.1 13.6 13.3 13.7  HCT 39.0 42.6 40.9 40.2 43.7  MCV 85.0 85.5 85.0 85.4 90.9  PLT 190 222 204 196 165   Basic Metabolic Panel: Recent Labs  Lab 08/15/18 2218 08/16/18 0418 08/16/18 1004 08/17/18 0431 08/18/18 0914 08/19/18 0442 08/20/18 0257  NA  --  138  --  137 138 139 137  K   --  3.3*  --  3.5 3.7 3.5 3.6  CL  --  101  --  106 104 106 106  CO2  --  25  --  _0 GLUCOSE  --  141*  --  171* 245* 162* 236*  BUN  --  33*  --  28* _1 CREATININE  --  2.11*  --  1.92* 1.78* 1.63* 1.61*  CALCIUM  --  8.5*  --  7.9* 8.4* 8.4* 8.4*  MG 2.4  --  2.4  --   --   --   --    GFR: Estimated Creatinine Clearance: 52.3 mL/min (A) (by C-G formula based on SCr of 1.61 mg/dL (H)). Liver Function Tests: Recent Labs  Lab 08/15/18 1527 08/16/18 0418  AST 25 25  ALT 21 19  ALKPHOS 44 44  BILITOT 1.2 0.9  PROT 6.1* 6.0*  ALBUMIN 3.2* 3.2*   Recent Labs  Lab 08/15/18 1527  LIPASE 31   No results for input(s): AMMONIA in the last 168 hours. Coagulation Profile: No results for input(s): INR, PROTIME in the last 168 hours. Cardiac Enzymes: Recent Labs  Lab 08/15/18 2218 08/16/18 0418 08/16/18 1004  TROPONINI  1.86* 1.96* 1.95*   BNP (last 3 results) No results for input(s): PROBNP in the last 8760 hours. HbA1C: No results for input(s): HGBA1C in the last 72 hours. CBG: Recent Labs  Lab 08/19/18 0742 08/19/18 1217 08/19/18 1657 08/19/18 2132 08/20/18 0736  GLUCAP 155* 160* 173* 245* 201*   Lipid Profile: No results for input(s): CHOL, HDL, LDLCALC, TRIG, CHOLHDL, LDLDIRECT in the last 72 hours. Thyroid Function Tests: No results for input(s): TSH, T4TOTAL, FREET4, T3FREE, THYROIDAB in the last 72 hours. Anemia Panel: No results for input(s): VITAMINB12, FOLATE, FERRITIN, TIBC, IRON, RETICCTPCT in the last 72 hours. Urine analysis:    Component Value Date/Time   COLORURINE YELLOW 08/15/2018 1616   APPEARANCEUR HAZY (A) 08/15/2018 1616   LABSPEC 1.018 08/15/2018 1616   PHURINE 5.0 08/15/2018 1616   GLUCOSEU NEGATIVE 08/15/2018 1616   HGBUR NEGATIVE 08/15/2018 1616   BILIRUBINUR NEGATIVE 08/15/2018 1616   KETONESUR NEGATIVE 08/15/2018 1616   PROTEINUR 100 (A) 08/15/2018 1616   NITRITE NEGATIVE 08/15/2018 1616   LEUKOCYTESUR NEGATIVE  08/15/2018 1616   Sepsis Labs: Invalid input(s): PROCALCITONIN, LACTICIDVEN  No results found for this or any previous visit (from the past 240 hour(s)).   Radiology Studies: No results found.  Scheduled Meds: . aspirin EC  81 mg Oral Daily  . atorvastatin  40 mg Oral q1800  . carvedilol  6.25 mg Oral BID WC  . chlorhexidine gluconate (MEDLINE KIT)  15 mL Mouth Rinse BID  . clomiPHENE  25 mg Oral Daily  . fentaNYL (SUBLIMAZE) injection  50 mcg Intravenous Once  . furosemide  40 mg Intravenous BID  . insulin aspart  0-9 Units Subcutaneous TID WC  . mouth rinse  15 mL Mouth Rinse 10 times per day  . midazolam  2 mg Intravenous Once  . mupirocin ointment  1 application Nasal BID  . omega-3 acid ethyl esters  1 g Oral QHS  . sertraline  25 mg Oral Daily  . sodium chloride flush  3 mL Intravenous Q12H   Continuous Infusions: . sodium chloride    . amiodarone    . fentaNYL infusion INTRAVENOUS 50 mcg/hr (08/20/18 1134)  . heparin 1,600 Units/hr (08/20/18 0430)  . sodium chloride 250 mL (08/20/18 1126)    Marzetta Board, MD, PhD [facility] Pager [phone]  If 7PM-7AM, please contact night-coverage [email or web address] Password [facility] 08/20/2018, 11:35 AM

## 2018-08-20 NOTE — Progress Notes (Signed)
Kandice RobinsonsSarah Groce, NP notified of critical troponin and lactic acid results. See new orders.

## 2018-08-20 NOTE — Code Documentation (Signed)
CODE BLUE NOTE  Patient Name: Jose Rasmussenommy L Heckel   MRN: [id number]   Date of Birth/ Sex: 09/29/1956 , male      Admission Date: 08/15/2018  Attending Provider: Jodelle Redhristopher, Bridgette, *  Primary Diagnosis: NSTEMI (non-ST elevated myocardial infarction) [facility](HCC)    Indication: Pt was in his usual state of health until this AM, when he was noted to be agonal breathing and subsequently went into PEA. Code blue was subsequently called. At the time of arrival on scene, ACLS protocol was underway.    Technical Description:  - CPR performance duration:  30 minutes  - Was defibrillation or cardioversion used? Yes   - Was external pacer placed? No  - Was patient intubated pre/post CPR? Yes    Medications Administered: Y = Yes; Blank = No Amiodarone  Y  Atropine    Calcium    Epinephrine  Y  Lidocaine    Magnesium  Y  Norepinephrine    Phenylephrine    Sodium bicarbonate    Vasopressin      Post CPR evaluation:  - Final Status - Was patient successfully resuscitated ? Yes - What is current rhythm? NSR - What is current hemodynamic status? Achieved ROSC   Miscellaneous Information:  - Labs sent, including: CBC, CMP, troponin, lactic acid  - Primary team notified?  Yes  - Family Notified? Yes  - Additional notes/ transfer status:  transferred to cardiac ICU        Ellwood DenseRumball, Alison, DO  08/20/2018, 2:18 PM

## 2018-08-20 NOTE — Progress Notes (Signed)
ANTICOAGULATION CONSULT NOTE   Pharmacy Consult:  Heparin Indication: chest pain/ACS  No Known Allergies  Patient Measurements: Height: 5\' 9"  (175.3 cm) Weight: 205 lb 6.4 oz (93.2 kg)(scale a) IBW/kg (Calculated) : 70.7 Heparin Dosing Weight: 90 kg  Vital Signs: Temp: 98.6 F (37 C) (08/23 2044) Temp Source: Oral (08/23 2044) BP: 127/94 (08/23 2200) Pulse Rate: 88 (08/23 2200)  Labs: Recent Labs    08/19/18 0442 08/20/18 0257 08/20/18 1016 08/20/18 1056 08/20/18 1716 08/20/18 2148  HGB 13.6 13.3  --  13.7  --   --   HCT 40.9 40.2  --  43.7  --   --   PLT 204 196  --  266  --   --   HEPARINUNFRC 0.40 0.22* 0.43  --   --  0.31  CREATININE 1.63* 1.61*  --  2.01*  --   --   TROPONINI  --   --   --  0.75* 1.74*  --     Estimated Creatinine Clearance: 41.9 mL/min (A) (by C-G formula based on SCr of 2.01 mg/dL (H)).   Assessment: [age] YOM presents with 9 days of abdominal pain and increasing distension. Pt is s/p cath on 8/22 which showed multivessel disease and ECHO showing LV apical thrombus. Pharmacy resumed IV heparin post LHC. Await CVTS eval  HL therapeutic. Goal of Therapy:  Heparin level 0.3-0.7 units/ml Monitor platelets by anticoagulation protocol: Yes    Plan:  Continue heparin at 1600 units/hr Daily heparin level and CBC Continue to monitor H&H and platelets Thanks for allowing pharmacy to be a part of this patient's care.  Talbert CageLora Wolfgang Finigan, PharmD Clinical Pharmacist 08/20/2018 10:45 PM

## 2018-08-20 NOTE — Progress Notes (Signed)
Responded to code blue event for this patient.  Sat with wife providing compassionate presence, emotional and grief support.  Wife wanted to call daughter [facility]  [facility] waiting from [facility].      08/20/18 1120  Clinical Encounter Type  Visited With Family;Health care provider  Visit Type Initial;Psychological support;Spiritual support;Code  Spiritual Encounters  Spiritual Needs Prayer;Emotional;Grief support

## 2018-08-20 NOTE — Care Management Note (Signed)
Case Management Note  Patient Details  Name: Jose Myers MRN: [id number] Date of Birth: 1956-07-25  Subjective/Objective:    NSTEMI               Action/Plan: Patient lives at home with spouse; PCP: Daisy Florooss, Charles Alan, MD; has private insurance with BCBS with prescription drug coverage; Multivessel Disease, LV Thrombus. CM will continue to follow for progression of care.  Expected Discharge Date:  Undetermined at this time, pt being transferred to the ICU              Expected Discharge Plan:   TBD  Status of Service:   In progress Reola MosherChandler, Kiera Hussey L, RN,MHA,BSN [phone] 08/20/2018, 10:46 AM

## 2018-08-20 NOTE — Progress Notes (Signed)
ANTICOAGULATION CONSULT NOTE   Pharmacy Consult:  Heparin Indication: chest pain/ACS  No Known Allergies  Patient Measurements: Height: 5\' 9"  (175.3 cm) Weight: 206 lb 14.4 oz (93.8 kg) IBW/kg (Calculated) : 70.7 Heparin Dosing Weight: 90 kg  Vital Signs: Temp: 97.4 F (36.3 C) (08/22 2011) Temp Source: Oral (08/22 2011) BP: 132/95 (08/22 2011) Pulse Rate: 91 (08/22 2011)  Labs: Recent Labs    08/17/18 0431 08/18/18 0422 08/18/18 0914 08/19/18 0442 08/20/18 0257  HGB 12.8* 14.1  --  13.6 13.3  HCT 39.0 42.6  --  40.9 40.2  PLT 190 222  --  204 196  HEPARINUNFRC 0.38 0.39  --  0.40 0.22*  CREATININE 1.92*  --  1.78* 1.63*  --     Estimated Creatinine Clearance: 51.7 mL/min (A) (by C-G formula based on SCr of 1.63 mg/dL (H)).   Assessment: [age] YOM presents with 9 days of abdominal pain and increasing distension. Pt is s/p cath on 8/22 which showed multivessel disease and ECHO showing LV apical thrombus. Pharmacy resumed IV heparin post LHC. Await CVTS eval  Initial HL post cath is subtherapeutic on 1500 units/hr. No bleeding reported per RN    Goal of Therapy:  Heparin level 0.3-0.7 units/ml Monitor platelets by anticoagulation protocol: Yes    Plan:  Increase heparin gtt to 1600 units/hr. No bolus  F/u 6 hr HL  Monitor daily HL, CBC and s/s of bleeding    Vinnie LevelBenjamin Tyese Finken, PharmD., BCPS Clinical Pharmacist Clinical phone for 08/20/18 until 8am: [phone]

## 2018-08-20 NOTE — Progress Notes (Signed)
Received pt from floor RT= s/p code& already intubated.  Pt placed on charted vent settings until CCM can place orders.

## 2018-08-20 NOTE — Plan of Care (Signed)
  Problem: Clinical Measurements: Goal: Ability to maintain clinical measurements within normal limits will improve Outcome: Progressing   Problem: Activity: Goal: Capacity to carry out activities will improve Outcome: Progressing   Problem: Education: Goal: Understanding of CV disease, CV risk reduction, and recovery process will improve Outcome: Progressing   Problem: Activity: Goal: Ability to return to baseline activity level will improve Outcome: Progressing

## 2018-08-20 NOTE — Plan of Care (Signed)
  Problem: Skin Integrity: Goal: Risk for impaired skin integrity will decrease Outcome: Progressing   Problem: Cardiac: Goal: Ability to achieve and maintain adequate cardiopulmonary perfusion will improve Outcome: Progressing

## 2018-08-20 NOTE — Progress Notes (Signed)
Progress Note  Patient Name: Jose Myers Date of Encounter: 08/20/2018  Primary Cardiologist: Dr. Harrell Gave (new)  Subjective   Patient initially seen this AM while he was having his PFTs done. He reported feeling well, and I planned to return to examine him once his testing was done. However, at 10:19 AM he went into VT and had a VT/VF cardiac arrest. He had a witnessed arrest by his wife and staff. CPR was immediately started. Per report, he was immediately shocked with brief ROSC and agonal breathing but lost a pulse again. He was then PEA and received epinephrine, then regaining pulse. Total time of code was 16 minutes, with 6 minutes of ROSC between the two pulseless events. He was started on amiodarone, intubated, and transferred to [facility] for further care.  I spent a significant amount of time talking with his wife, answering all questions.  Inpatient Medications    Scheduled Meds: . aspirin EC  81 mg Oral Daily  . atorvastatin  40 mg Oral q1800  . chlorhexidine gluconate (MEDLINE KIT)  15 mL Mouth Rinse BID  . insulin aspart  0-9 Units Subcutaneous Q4H  . mouth rinse  15 mL Mouth Rinse 10 times per day  . mupirocin ointment  1 application Nasal BID  . omega-3 acid ethyl esters  1 g Oral QHS  . sertraline  25 mg Oral Daily  . sodium chloride flush  3 mL Intravenous Q12H   Continuous Infusions: . sodium chloride    . famotidine (PEPCID) IV    . fentaNYL infusion INTRAVENOUS 50 mcg/hr (08/20/18 1134)  . heparin 1,600 Units/hr (08/20/18 0430)   PRN Meds: sodium chloride, acetaminophen **OR** acetaminophen, docusate, fentaNYL, midazolam, sodium chloride flush, traZODone   Vital Signs    Vitals:   08/20/18 1145 08/20/18 1150 08/20/18 1155 08/20/18 1200  BP: 99/79 (!) 117/91 107/82   Pulse: 88 100 90 88  Resp:  (!) 25 (!) 21 20  Temp:      TempSrc:      SpO2: 90% (!) 85% (!) 86% (!) 89%  Weight:      Height:        Intake/Output Summary (Last 24 hours) at  08/20/2018 1317 Last data filed at 08/20/2018 0239 Gross per 24 hour  Intake 1025.7 ml  Output 2050 ml  Net -1024.3 ml   Filed Weights   08/18/18 0431 08/19/18 0515 08/20/18 0459  Weight: 94 kg 93.8 kg 93.2 kg    Telemetry    VT/VF arrest seen on monitor at 10:19 AM. Had intermittent PEA and VT after initially shock - Personally Reviewed  ECG    NSR, long qt, anterior and inferior prior infarct- Personally Reviewed  Physical Exam   GEN: intubated and sedated, moves all limbs to stimuli Neck: supple, JVD not visible at Cardiac: regular S1 and S2, 1/6 holosystolic murmurs, no rubs or gallops.  Respiratory: mechanical ventilation sounds GI: Soft, nontender, mildly distended. Bowel sounds normal MS: trace pitting BL LE edema; No deformity. Neuro:  intubated and sedated Psych: intubated and sedated  Labs    Chemistry Recent Labs  Lab 08/15/18 1527 08/16/18 0418  08/19/18 0442 08/20/18 0257 08/20/18 1056  NA 135 138   < > 139 137 138  K 4.0 3.3*   < > 3.5 3.6 4.7  CL 98 101   < > 106 106 105  CO2 25 25   < > 23 23 14*  GLUCOSE 205* 141*   < > 162* 236*  374*  BUN 35* 33*   < > 23 20 21   CREATININE 2.59* 2.11*   < > 1.63* 1.61* 2.01*  CALCIUM 8.4* 8.5*   < > 8.4* 8.4* 8.0*  PROT 6.1* 6.0*  --   --   --  5.5*  ALBUMIN 3.2* 3.2*  --   --   --  2.8*  AST 25 25  --   --   --  81*  ALT 21 19  --   --   --  55*  ALKPHOS 44 44  --   --   --  43  BILITOT 1.2 0.9  --   --   --  1.5*  GFRNONAA 25* 31*   < > 43* 44* 33*  GFRAA 28* 36*   < > 50* 51* 39*  ANIONGAP 12 12   < > 10 8 19*   < > = values in this interval not displayed.     Hematology Recent Labs  Lab 08/19/18 0442 08/20/18 0257 08/20/18 1056  WBC 9.5 9.7 14.6*  RBC 4.81 4.71 4.81  HGB 13.6 13.3 13.7  HCT 40.9 40.2 43.7  MCV 85.0 85.4 90.9  MCH 28.3 28.2 28.5  MCHC 33.3 33.1 31.4  RDW 13.6 13.7 14.0  PLT 204 196 266    Cardiac Enzymes Recent Labs  Lab 08/15/18 2218 08/16/18 0418 08/16/18 1004  08/20/18 1056  TROPONINI 1.86* 1.96* 1.95* 0.75*    Recent Labs  Lab 08/15/18 1845  TROPIPOC 0.88*     BNP Recent Labs  Lab 08/15/18 1649  BNP 930.7*     DDimer No results for input(s): DDIMER in the last 168 hours.   Radiology    Dg Chest Port 1 View  Result Date: 08/20/2018 CLINICAL DATA:  Status post central line placement EXAM: PORTABLE CHEST 1 VIEW COMPARISON:  08/15/2018 FINDINGS: Endotracheal tube is now noted in satisfactory position. Nasogastric catheter extends into the stomach although the tip is not visualized on this image. Left jugular central line is noted in the proximal superior vena cava. No pneumothorax is noted. Mild vascular congestion and bibasilar atelectatic changes are seen. IMPRESSION: Tubes and lines as described above. Vascular congestion and bibasilar atelectatic changes. Electronically Signed   By: Inez Catalina M.D.   On: 08/20/2018 12:41   Dg Abd Portable 1v  Result Date: 08/20/2018 CLINICAL DATA:  OG tube placement EXAM: PORTABLE ABDOMEN - 1 VIEW COMPARISON:  None. FINDINGS: Orogastric tube with the tip projecting over the stomach. There is relative paucity of bowel gas. There is no evidence of pneumoperitoneum, portal venous gas or pneumatosis. There are no pathologic calcifications along the expected course of the ureters. The osseous structures are unremarkable. IMPRESSION: Orogastric tube with the tip projecting over the stomach. Electronically Signed   By: Kathreen Devoid   On: 08/20/2018 12:39    Cardiac Studies   TTE 08/18/18 Study Conclusions  - Left ventricle: The cavity size was normal. Wall thickness was   increased in a pattern of mild LVH. Systolic function was   severely reduced. The estimated ejection fraction was in the   range of 25% to 30%. Diffuse hypokinesis. There is akinesis of   the anteroseptal, anterior, and anterolateral myocardium. There   was a large, 3.3 cm (L) x 1.8 cm (W), spherical, fixed, apical    anteriorthrombusassociated with an akinetic segment. - Aortic valve: Trileaflet; mildly thickened, mildly calcified   leaflets. - Mitral valve: There was moderate regurgitation. - Left atrium: The  atrium was mildly dilated. - Tricuspid valve: There was mild regurgitation. - Pulmonary arteries: Systolic pressure was moderately increased.   PA peak pressure: 59 mm Hg (S).  Recommendations:  This procedure has been discussed with the referring physician.  Cath 08/19/18   Severe multivessel CAD including left main.  Distal left main 85%.  Diffusely diseased and totally occluded LAD in the mid segment.  Proximal 80 to 99% obstruction.  Diagonals are totally occluded.  80% ostial circumflex, 90% proximal first obtuse marginal, and total occlusion of the circumflex beyond the first marginal.  Diffuse disease in the first marginal.  Dominant right coronary with segmental 70% distal stenosis, 85% distal RCA beyond the PDA but before the first large left ventricular branch.  Diffuse disease in the proximal to midportion of the large right ventricular branch and the second posterolateral branch.  Left ventriculography and hemodynamic recordings were not performed because of LV apical thrombus.  Recommendations:   T CTS evaluation for surgical revascularization.  Guideline directed therapy for systolic heart failure: SGLT2 therapy for diabetes, resume ARB but preferably Entresto for decreased LV function, beta-blocker therapy as tolerated, and diuresis as needed for symptom control.  Monitor kidney function closely.  Anticoagulation for acute coronary syndrome and LV thrombus   Recommend uninterrupted dual antiplatelet therapy with Aspirin 11m daily for a minimum of Eternity.  Patient Profile     62y.o. male with history of CAD s/p stent 20 years ago (unknown location), diabetes presented with ~10 days of abdominal bloating and pain, associated with PND, orthopnea, dyspnea on  exertion, LE edema, and cough. Troponin elevated, ECG concerning for prior anteroseptal infarct. Suspected new AKI as well. Picture concerning for acute systolic heart failure, likely with recent MI that manifested as abdominal pain.   Assessment & Plan    Severe three vessel CAD, suspect likely missed STEMI two weeks ago with occlusion of mid LAD. Now with acute systolic and diastolic heart failure, LV thrombus over akinetic anterior apex.   He underwent cath yesterday and was being evaluated for high risk CABG. He was doing well overall when he suddenly had a VT/VF arrest this morning. -I am concerned as he had a large apical thrombus, and he now has had chest compressions and defibrillation. On my read, LV thrombus no longer prominent. He was having responsive movements, but I am concerned for risk of embolic stroke given need to continue anticoagulation. I spoke with neuroradiology, who (given his creatinine) would recommend MRI/MRA of head and neck to look for embolism.  ADDENDUM: Final echo read: Study Conclusions  - Left ventricle: Systolic function was severely reduced. The   estimated ejection fraction was in the range of 25% to 30%.   Dyskinesis and scarring of the apical myocardium. Akinesis of the   mid-apicalanteroseptal, anterior, and anterolateral myocardium;   consistent with infarction in the distribution of the left   anterior descending coronary artery; unchanged from the study of   08/18/2018. Doppler parameters are consistent with restrictive   physiology, indicative of decreased left ventricular diastolic   compliance and/or increased left atrial pressure. No evidence of   thrombus. - Mitral valve: Calcified annulus. There was severe regurgitation   directed centrally. The acceleration rate of the regurgitant jet   was reduced, consistent with a low dP/dt. Severe regurgitation is   suggested by pulmonary vein systolic flow reversal. - Left atrium: The atrium was  severely dilated. - Tricuspid valve: There was mild-moderate regurgitation directed   centrally. -  Pulmonary arteries: Systolic pressure was mildly increased. PA   peak pressure: 43 mm Hg (S).  Impressions:  - Compared to the previous study images, the large LV apical   thrombus is no longer present. Wall motion and EF are unchanged,   but the mitral insufficiency appears worse.  Plan: -continue anticoagulation with heparin, though will scan head to make sure there is no concern for embolic phenomenon given anticoagulation and risk of transformation -holding beta blocker given need for pressors, no ACEI/ARB with AKI -continue aspirin and statin -continue amiodarone -contacted CT surgery to let them know about the arrest. Will need to follow up to determine planning going forward -I had several long discussions with his wife and family. If we can keep his rhythm stable and he has no further setbacks, he was extremely functional prior to admission. However, assuming this was a silent STEMI two weeks ago, he is in the window for acute complications, most concerning would be free wall rupture given the location of the MI. All questions answered.  Acute kidney injury: was improving, but now worsened post arrest. Will continue to follow urine output and Cr Diabetes: SSI HTN: holding antihypertensives, now on pressors with sedation  Time Spent Directly with Patient: I have spent a total of 115 minutes with the patient/family reviewing hospital notes, telemetry, EKGs, labs and examining the patient as well as establishing an assessment and plan that was discussed personally with the patient.  > 50% of time was spent in direct patient care.  CRITICAL CARE Performed by: Buford Dresser  Total critical care time: 115 minutes Critical care time was exclusive of separately billable procedures and treating other patients. Critical care was necessary to treat or prevent imminent or  life-threatening deterioration.  Critical care was time spent personally by me on the following activities: development of treatment plan with patient and/or surrogate as well as nursing, discussions with consultants, evaluation of patient's response to treatment, examination of patient, obtaining history from patient or surrogate, ordering and performing treatments and interventions, ordering and review of laboratory studies, ordering and review of radiographic studies, pulse oximetry and re-evaluation of patient's condition.  Length of Stay:  LOS: 5 days   Buford Dresser, MD, PhD [facility]  [facility]   08/20/2018, 1:17 PM      For questions or updates, please contact [place] Please consult [email or web address] for contact info under Cardiology/STEMI.

## 2018-08-21 ENCOUNTER — Inpatient Hospital Stay (HOSPITAL_COMMUNITY): Payer: BLUE CROSS/BLUE SHIELD

## 2018-08-21 DIAGNOSIS — I25110 Atherosclerotic heart disease of native coronary artery with unstable angina pectoris: Secondary | ICD-10-CM

## 2018-08-21 LAB — GLUCOSE, CAPILLARY
GLUCOSE-CAPILLARY: 207 mg/dL — AB (ref 70–99)
GLUCOSE-CAPILLARY: 252 mg/dL — AB (ref 70–99)
GLUCOSE-CAPILLARY: 256 mg/dL — AB (ref 70–99)
Glucose-Capillary: 210 mg/dL — ABNORMAL HIGH (ref 70–99)
Glucose-Capillary: 207 mg/dL — ABNORMAL HIGH (ref 70–99)
Glucose-Capillary: 226 mg/dL — ABNORMAL HIGH (ref 70–99)

## 2018-08-21 LAB — POCT I-STAT 3, ART BLOOD GAS (G3+)
Acid-base deficit: 5 mmol/L — ABNORMAL HIGH (ref 0.0–2.0)
Bicarbonate: 19.5 mmol/L — ABNORMAL LOW (ref 20.0–28.0)
O2 SAT: 98 %
PCO2 ART: 34 mmHg (ref 32.0–48.0)
PH ART: 7.366 (ref 7.350–7.450)
PO2 ART: 113 mmHg — AB (ref 83.0–108.0)
TCO2: 21 mmol/L — ABNORMAL LOW (ref 22–32)

## 2018-08-21 LAB — CBC
HEMATOCRIT: 41.3 % (ref 39.0–52.0)
HEMOGLOBIN: 13.4 g/dL (ref 13.0–17.0)
MCH: 28.5 pg (ref 26.0–34.0)
MCHC: 32.4 g/dL (ref 30.0–36.0)
MCV: 87.9 fL (ref 78.0–100.0)
Platelets: 242 10*3/uL (ref 150–400)
RBC: 4.7 MIL/uL (ref 4.22–5.81)
RDW: 14.1 % (ref 11.5–15.5)
WBC: 17.5 10*3/uL — AB (ref 4.0–10.5)

## 2018-08-21 LAB — COMPREHENSIVE METABOLIC PANEL
ALBUMIN: 2.8 g/dL — AB (ref 3.5–5.0)
ALK PHOS: 41 U/L (ref 38–126)
ALT: 63 U/L — AB (ref 0–44)
AST: 91 U/L — AB (ref 15–41)
Anion gap: 11 (ref 5–15)
BUN: 30 mg/dL — ABNORMAL HIGH (ref 8–23)
CALCIUM: 7.8 mg/dL — AB (ref 8.9–10.3)
CHLORIDE: 106 mmol/L (ref 98–111)
CO2: 21 mmol/L — AB (ref 22–32)
CREATININE: 2.56 mg/dL — AB (ref 0.61–1.24)
GFR calc non Af Amer: 25 mL/min — ABNORMAL LOW (ref >60)
GFR, EST AFRICAN AMERICAN: 29 mL/min — AB (ref >60)
GLUCOSE: 224 mg/dL — AB (ref 70–99)
Potassium: 5.4 mmol/L — ABNORMAL HIGH (ref 3.5–5.1)
SODIUM: 138 mmol/L (ref 135–145)
Total Bilirubin: 1.4 mg/dL — ABNORMAL HIGH (ref 0.3–1.2)
Total Protein: 5.5 g/dL — ABNORMAL LOW (ref 6.5–8.1)

## 2018-08-21 LAB — TROPONIN I: TROPONIN I: 5.76 ng/mL — AB (ref <0.03)

## 2018-08-21 LAB — HEPATIC FUNCTION PANEL

## 2018-08-21 LAB — PHOSPHORUS: PHOSPHORUS: 5.1 mg/dL — AB (ref 2.5–4.6)

## 2018-08-21 LAB — HEPARIN LEVEL (UNFRACTIONATED): Heparin Unfractionated: 0.63 IU/mL (ref 0.30–0.70)

## 2018-08-21 LAB — MAGNESIUM: Magnesium: 2.5 mg/dL — ABNORMAL HIGH (ref 1.7–2.4)

## 2018-08-21 LAB — HEMOGLOBIN A1C
HEMOGLOBIN A1C: 8.2 % — AB (ref 4.8–5.6)
Mean Plasma Glucose: 188.64 mg/dL

## 2018-08-21 MED ORDER — PROMETHAZINE HCL 25 MG/ML IJ SOLN
12.5000 mg | Freq: Once | INTRAMUSCULAR | Status: AC
  Administered 2018-08-22: 12.5 mg via INTRAVENOUS
  Filled 2018-08-21: qty 1

## 2018-08-21 MED ORDER — LORAZEPAM 0.5 MG PO TABS
0.5000 mg | ORAL_TABLET | Freq: Every day | ORAL | Status: DC | PRN
  Administered 2018-08-21 – 2018-09-04 (×10): 0.5 mg via ORAL
  Filled 2018-08-21 (×11): qty 1

## 2018-08-21 MED ORDER — ORAL CARE MOUTH RINSE
15.0000 mL | Freq: Two times a day (BID) | OROMUCOSAL | Status: DC
  Administered 2018-08-23 – 2018-08-27 (×8): 15 mL via OROMUCOSAL

## 2018-08-21 MED ORDER — ONDANSETRON HCL 4 MG/2ML IJ SOLN
4.0000 mg | Freq: Four times a day (QID) | INTRAMUSCULAR | Status: DC | PRN
  Administered 2018-08-21 – 2018-09-04 (×14): 4 mg via INTRAVENOUS
  Filled 2018-08-21 (×14): qty 2

## 2018-08-21 NOTE — Progress Notes (Signed)
50 cc of Fentanyl was wasted and witnessed by Ardith DarkLeah Harrison, RN.

## 2018-08-21 NOTE — Procedures (Signed)
Extubation Procedure Note  Patient Details:   Name: Jose Myers DOB: 1956-03-04 MRN: [id number]   Airway Documentation:  + cuff leak test prior to extubation.  Confirmed w/ MD to extubate on 8 peep.   Vent end date: 08/21/18 Vent end time: 1210   Evaluation  O2 sats: stable throughout Complications: No apparent complications Patient did tolerate procedure well. Bilateral Breath Sounds: Clear   Yes pt able to speak.  No stridor noted, no distress noted.  Pt denies SOB.    Jennette KettleBrowning, Andreas Sobolewski Joy 08/21/2018, 12:13 PM

## 2018-08-21 NOTE — Progress Notes (Signed)
PULMONARY / CRITICAL CARE MEDICINE   Name: Jose Myers MRN: [id number] DOB: [birth year]    ADMISSION DATE:  08/15/2018 CONSULTATION DATE:  08/20/2018  REFERRING MD:  B. Christopher  CHIEF COMPLAINT:  V-fib arrest  HISTORY OF PRESENT ILLNESS:   This is a 62 year old male admitted 08/15/2018 with history of diabetes and HTN who ruled in for non-STEMI, , presenting with systolic congestive heart failure and acute renal insufficiency  who had been medically stabilized. His cath 8/21 revealed severe three-vessel coronary artery disease and ejection fraction on echocardiogram is impaired at 25 to 30% and an LV thrombus. Heparin was started.  He appeared to be a suitable candidate to proceed with coronary artery surgical revascularization, which was scheduled for 08/23/2018 . He  went into v-fib/ v-tach arrest on 08/20/2018 . He was resuscitated with  CPR, he received epi x 2 and was defibrillated x 1. Total code time was 16 minutes with 6 minutes of ROSC in between. He was given Mag 2 grams, and bolused  with amiodarone and started on gtt.PCCM was asked to consult for acute respiratory failure and medical management. He did well overnight with no further arrhythmias.  PAST MEDICAL HISTORY :  He  has a past medical history of Coronary artery disease, Diabetes mellitus without complication (HCC), and Hypertension.  PAST SURGICAL HISTORY: He  has a past surgical history that includes Cardiac catheterization; Eye surgery; CORONARY ANGIOGRAPHY (N/A, 08/19/2018); RIGHT HEART CATH (N/A, 08/19/2018); and Percutaneous coronary stent intervention (pci-s) (04/11/2003).  No Known Allergies  No current facility-administered medications on file prior to encounter.    Current Outpatient Medications on File Prior to Encounter  Medication Sig  . Apple Cid Vn-Grn Tea-Bit Or-Cr (APPLE CIDER VINEGAR PLUS) TABS Take 1 tablet by mouth daily.  . carvedilol (COREG) 6.25 MG tablet Take 6.25 mg by mouth 2 (two)  times daily with a meal.  . clomiPHENE (CLOMID) 50 MG tablet Take 25 mg by mouth daily.  Marland Kitchen glimepiride (AMARYL) 4 MG tablet Take 4 mg by mouth 2 (two) times daily.  Marland Kitchen LORazepam (ATIVAN) 1 MG tablet Take 0.5-1 mg by mouth daily as needed for anxiety.  Marland Kitchen losartan (COZAAR) 100 MG tablet Take 100 mg by mouth daily.  . metFORMIN (GLUCOPHAGE) 500 MG tablet Take 500 mg by mouth 3 (three) times daily.   . Omega 3 340 MG CPDR Take 1 capsule by mouth at bedtime.  . Prenat-FePoly-Fered-FA-Omega 3 (DUET DHA 430 PO) Take 1 capsule by mouth daily.  . sertraline (ZOLOFT) 50 MG tablet Take 25 mg by mouth daily.  . traZODone (DESYREL) 50 MG tablet Take 50 mg by mouth at bedtime as needed.  . verapamil (VERELAN PM) 240 MG 24 hr capsule Take 240 mg by mouth 2 (two) times daily.     FAMILY HISTORY:  His family history includes Diabetes Mellitus II in his mother; Throat cancer in his brother.  SOCIAL HISTORY: He  reports that he has never smoked. He has never used smokeless tobacco. He reports that he drinks alcohol.  SUBJECTIVE:  Alert and indicates no c/o. Eager to be extubated.  VITAL SIGNS: BP 110/81   Pulse 92   Temp 98.4 F (36.9 C) (Oral)   Resp 20   Ht 5\' 9"  (1.753 m)   Wt 93.2 kg Comment: scale a  SpO2 97%   BMI 30.33 kg/m   HEMODYNAMICS: CVP:  [12 mmHg-31 mmHg] 16 mmHg  VENTILATOR SETTINGS: Vent Mode: PSV;CPAP FiO2 (%):  [40 %-100 %] 40 %  Set Rate:  [18 bmp] 18 bmp Vt Set:  [570 mL] 570 mL PEEP:  [8 cmH20-10 cmH20] 8 cmH20 Pressure Support:  [5 cmH20] 5 cmH20 Plateau Pressure:  [19 cmH20-22 cmH20] 19 cmH20  INTAKE / OUTPUT: I/O last 3 completed shifts: In: 2170.2 [P.O.:480; I.V.:1380; IV Piggyback:310.2] Out: 2665 [Urine:2265; Emesis/NG output:400]  PHYSICAL EXAMINATION: General:  WD/WN NARD Neuro:  A&O. No focal deficits HEENT:  [place]/AT; PERRL, EOMI; ETT in place Cardiovascular:  RRR, No M/R/G Lungs:  Clear Abdomen:  Supple, NT, No H-S-megaly Musculoskeletal:  No active  joints Skin:  N0 C/C/E  LABS:  BMET Recent Labs  Lab 08/20/18 0257 08/20/18 1056 08/21/18 0520  NA 137 138 138  K 3.6 4.7 5.4*  CL 106 105 106  CO2 23 14* 21*  BUN 20 21 30*  CREATININE 1.61* 2.01* 2.56*  GLUCOSE 236* 374* 224*    Electrolytes Recent Labs  Lab 08/20/18 0257 08/20/18 1056 08/20/18 1329 08/21/18 0340 08/21/18 0520  CALCIUM 8.4* 8.0*  --   --  7.8*  MG  --   --  2.6* SPECIMEN HEMOLYZED. HEMOLYSIS MAY AFFECT INTEGRITY OF RESULTS. 2.5*  PHOS  --   --  5.6*  --  5.1*    CBC Recent Labs  Lab 08/20/18 0257 08/20/18 1056 08/21/18 0340  WBC 9.7 14.6* 17.5*  HGB 13.3 13.7 13.4  HCT 40.2 43.7 41.3  PLT 196 266 242    Coag's No results for input(s): APTT, INR in the last 168 hours.  Sepsis Markers Recent Labs  Lab 08/20/18 1056 08/20/18 1329 08/20/18 1716  LATICACIDVEN 8.6* 2.9* 2.1*  PROCALCITON  --  <0.10  --     ABG Recent Labs  Lab 08/20/18 1133 08/20/18 1406 08/21/18 0439  PHART 7.164* 7.349* 7.366  PCO2ART 41.6 40.4 34.0  PO2ART 74.0* 130.0* 113.0*    Liver Enzymes Recent Labs  Lab 08/20/18 1056 08/21/18 0340 08/21/18 0520  AST 81* SPECIMEN HEMOLYZED. HEMOLYSIS MAY AFFECT INTEGRITY OF RESULTS. 91*  ALT 55* SPECIMEN HEMOLYZED. HEMOLYSIS MAY AFFECT INTEGRITY OF RESULTS. 63*  ALKPHOS 43 SPECIMEN HEMOLYZED. HEMOLYSIS MAY AFFECT INTEGRITY OF RESULTS. 41  BILITOT 1.5* SPECIMEN HEMOLYZED. HEMOLYSIS MAY AFFECT INTEGRITY OF RESULTS. 1.4*  ALBUMIN 2.8* SPECIMEN HEMOLYZED. HEMOLYSIS MAY AFFECT INTEGRITY OF RESULTS. 2.8*    Cardiac Enzymes Recent Labs  Lab 08/20/18 1056 08/20/18 1716 08/20/18 2324  TROPONINI 0.75* 1.74* 5.76*    Glucose Recent Labs  Lab 08/20/18 1232 08/20/18 1548 08/20/18 1953 08/20/18 2329 08/21/18 0348 08/21/18 1134  GLUCAP 330* 326* 241* 223* 210* 207*    Imaging Dg Chest Port 1 View  Result Date: 08/21/2018 CLINICAL DATA:  Cardiac arrest. EXAM: PORTABLE CHEST 1 VIEW COMPARISON:  Radiographs  of August 20, 2018. FINDINGS: Stable cardiomegaly. Mild central pulmonary vascular congestion is noted. Endotracheal and nasogastric tubes are unchanged in position. Left internal jugular catheter is unchanged in position. No pneumothorax is noted. Stable bibasilar subsegmental atelectasis or edema is noted with probable small pleural effusions. Bony thorax is unremarkable. IMPRESSION: Stable cardiomegaly with central pulmonary vascular congestion. Stable support apparatus. Stable bibasilar atelectasis or edema is noted with probable small pleural effusions. Electronically Signed   By: Lupita RaiderJames  Green Jr, M.D.   On: 08/21/2018 08:28   Dg Chest Port 1 View  Result Date: 08/20/2018 CLINICAL DATA:  Status post central line placement EXAM: PORTABLE CHEST 1 VIEW COMPARISON:  08/15/2018 FINDINGS: Endotracheal tube is now noted in satisfactory position. Nasogastric catheter extends into the stomach although the tip is  not visualized on this image. Left jugular central line is noted in the proximal superior vena cava. No pneumothorax is noted. Mild vascular congestion and bibasilar atelectatic changes are seen. IMPRESSION: Tubes and lines as described above. Vascular congestion and bibasilar atelectatic changes. Electronically Signed   By: Alcide Clever M.D.   On: 08/20/2018 12:41   Dg Abd Portable 1v  Result Date: 08/20/2018 CLINICAL DATA:  OG tube placement EXAM: PORTABLE ABDOMEN - 1 VIEW COMPARISON:  None. FINDINGS: Orogastric tube with the tip projecting over the stomach. There is relative paucity of bowel gas. There is no evidence of pneumoperitoneum, portal venous gas or pneumatosis. There are no pathologic calcifications along the expected course of the ureters. The osseous structures are unremarkable. IMPRESSION: Orogastric tube with the tip projecting over the stomach. Electronically Signed   By: Elige Ko   On: 08/20/2018 12:39     STUDIES:  PCXR today: LLL  atelectasis  ANTIBIOTICS: None  LINES/TUBES: ETT LIJ TLC Foley  DISCUSSION: A 62 year old male admitted with history of diabetes and HTN who ruled in for non-STEMI, , presenting with systolic congestive heart failure and acute renal insufficiency. His cath 8/21 revealed severe three-vessel coronary artery disease and ejection fraction on echocardiogram is impaired at 25 to 30% and an LV thrombus, for which heparin was started.  He appeared to be a suitable candidate to proceed with coronary artery surgical revascularization, which was scheduled for 08/23/2018 . He had a v-fib/ v-tach arrest on 08/20/2018, was successfully resuscitated; with  CPR and received epi x 2 and was defibrillated x 1. His post-arrest course was uneventful.  ASSESSMENT / PLAN:  PULMONARY A: Adequately oxygenating and ventilating on PSV P:   Will extubate  CARDIOVASCULAR A:  CAD, CHF, s/p VT arrest P:  Per Cardiol note  RENAL A:   AKI. Creat rising but making urine P:   D/C Foley, Follow renal labs  GASTROINTESTINAL A:   Not active P:   D/C pepcid after extubation    FAMILY  - Updates: Spoke with family at bedside   Critical care time: 35 min   Pulmonary and Critical Care Medicine [facility] Pager: [phone]  08/21/2018, 11:49 AM

## 2018-08-21 NOTE — Progress Notes (Signed)
ANTICOAGULATION CONSULT NOTE   Pharmacy Consult:  Heparin Indication: chest pain/ACS  No Known Allergies  Patient Measurements: Height: 5\' 9"  (175.3 cm) Weight: 205 lb 6.4 oz (93.2 kg)(scale a) IBW/kg (Calculated) : 70.7 Heparin Dosing Weight: 90 kg  Vital Signs: Temp: 98.4 F (36.9 C) (08/24 1125) Temp Source: Oral (08/24 1125) BP: 116/90 (08/24 1200) Pulse Rate: 98 (08/24 1210)  Labs: Recent Labs    08/20/18 0257 08/20/18 1016 08/20/18 1056 08/20/18 1716 08/20/18 2148 08/20/18 2324 08/21/18 0340 08/21/18 0520  HGB 13.3  --  13.7  --   --   --  13.4  --   HCT 40.2  --  43.7  --   --   --  41.3  --   PLT 196  --  266  --   --   --  242  --   HEPARINUNFRC 0.22* 0.43  --   --  0.31  --  0.63  --   CREATININE 1.61*  --  2.01*  --   --   --   --  2.56*  TROPONINI  --   --  0.75* 1.74*  --  5.76*  --   --     Estimated Creatinine Clearance: 32.9 mL/min (A) (by C-G formula based on SCr of 2.56 mg/dL (H)).   Assessment: [age] YOM presents with 9 days of abdominal pain and increasing distension. Pt is s/p cath on 8/22 which showed multivessel CAD but not surgical candidate  - discussing PCI.  s/p  ECHO showing LV apical thrombus. Pharmacy resumed IV heparin post LHC. S/p VT arrest 8/23  Heparin drip 1600 uts/hr HL 0.6 at goal, h/h ok no bleeding    Goal of Therapy:  Heparin level 0.3-0.7 units/ml Monitor platelets by anticoagulation protocol: Yes    Plan:  Continue heparin at 1600 units/hr Daily heparin level and CBC Continue to monitor H&H and platelets   Leota SauersLisa Tanay Massiah Pharm.D. CPP, BCPS Clinical Pharmacist [phone] 08/21/2018 1:05 PM

## 2018-08-21 NOTE — Progress Notes (Signed)
Wasted 200 of fentanyl in sink with Laverna PeaceShanna Hintz RN as witness

## 2018-08-21 NOTE — Progress Notes (Signed)
Progress Note  Patient Name: Jose Myers Date of Encounter: 08/21/2018  Primary Cardiologist: No primary care provider on file.   Subjective   He is wide awake and alert, able to communicate through gestures and facial expressions, even able to convey subtle humor. No further ventricular arrhythmia.  On IV amiodarone Echocardiogram did not show any significant change in left ventricular systolic function or wall motion, but the large LV apical thrombus is no longer present. +600 mL in last 24 hours, but negative 4.5 L for the hospital stay.  Inpatient Medications    Scheduled Meds: . aspirin EC  81 mg Oral Daily  . atorvastatin  40 mg Oral q1800  . chlorhexidine gluconate (MEDLINE KIT)  15 mL Mouth Rinse BID  . insulin aspart  0-9 Units Subcutaneous Q4H  . mouth rinse  15 mL Mouth Rinse 10 times per day  . omega-3 acid ethyl esters  1 g Oral QHS  . sertraline  25 mg Oral Daily  . sodium chloride flush  3 mL Intravenous Q12H   Continuous Infusions: . sodium chloride    . amiodarone 30 mg/hr (08/21/18 0600)  . famotidine (PEPCID) IV Stopped (08/20/18 1414)  . fentaNYL infusion INTRAVENOUS 200 mcg/hr (08/21/18 1032)  . heparin 1,600 Units/hr (08/21/18 0600)  . norepinephrine (LEVOPHED) Adult infusion Stopped (08/21/18 0521)   PRN Meds: sodium chloride, acetaminophen **OR** acetaminophen, docusate, fentaNYL, midazolam, sodium chloride flush, traZODone   Vital Signs    Vitals:   08/21/18 0645 08/21/18 0700 08/21/18 0721 08/21/18 0923  BP: 106/79 102/76  111/80  Pulse: 72 71  81  Resp: 19 18  19   Temp:   98.5 F (36.9 C)   TempSrc:   Oral   SpO2: 100% 100%  100%  Weight:      Height:        Intake/Output Summary (Last 24 hours) at 08/21/2018 1034 Last data filed at 08/21/2018 0600 Gross per 24 hour  Intake 1624.47 ml  Output 1015 ml  Net 609.47 ml   Filed Weights   08/18/18 0431 08/19/18 0515 08/20/18 0459  Weight: 94 kg 93.8 kg 93.2 kg    Telemetry      Normal sinus rhythm- Personally Reviewed  ECG    No new tracing; today's tracing showed sinus rhythm, minor IVCD with QRS 112 ms, old anterior MI and old inferior MI, prolonged QTC 575 ms Compared to the ECG from 8/18, Q waves are now seen in the inferior leads. - Personally Reviewed  Physical Exam  Intubated, but alert and oriented GEN: No acute distress.   Neck: No JVD Cardiac: RRR, no murmurs, rubs, or gallops.  Respiratory: Clear to auscultation bilaterally. GI: Soft, nontender, non-distended  MS: No edema; No deformity. Neuro:  Nonfocal  Psych: Normal affect   Labs    Chemistry Recent Labs  Lab 08/20/18 0257 08/20/18 1056 08/21/18 0340 08/21/18 0520  NA 137 138  --  138  K 3.6 4.7  --  5.4*  CL 106 105  --  106  CO2 23 14*  --  21*  GLUCOSE 236* 374*  --  224*  BUN 20 21  --  30*  CREATININE 1.61* 2.01*  --  2.56*  CALCIUM 8.4* 8.0*  --  7.8*  PROT  --  5.5* SPECIMEN HEMOLYZED. HEMOLYSIS MAY AFFECT INTEGRITY OF RESULTS. 5.5*  ALBUMIN  --  2.8* SPECIMEN HEMOLYZED. HEMOLYSIS MAY AFFECT INTEGRITY OF RESULTS. 2.8*  AST  --  81* SPECIMEN HEMOLYZED. HEMOLYSIS  MAY AFFECT INTEGRITY OF RESULTS. 91*  ALT  --  55* SPECIMEN HEMOLYZED. HEMOLYSIS MAY AFFECT INTEGRITY OF RESULTS. 63*  ALKPHOS  --  43 SPECIMEN HEMOLYZED. HEMOLYSIS MAY AFFECT INTEGRITY OF RESULTS. 41  BILITOT  --  1.5* SPECIMEN HEMOLYZED. HEMOLYSIS MAY AFFECT INTEGRITY OF RESULTS. 1.4*  GFRNONAA 44* 33*  --  25*  GFRAA 51* 39*  --  29*  ANIONGAP 8 19*  --  11     Hematology Recent Labs  Lab 08/20/18 0257 08/20/18 1056 08/21/18 0340  WBC 9.7 14.6* 17.5*  RBC 4.71 4.81 4.70  HGB 13.3 13.7 13.4  HCT 40.2 43.7 41.3  MCV 85.4 90.9 87.9  MCH 28.2 28.5 28.5  MCHC 33.1 31.4 32.4  RDW 13.7 14.0 14.1  PLT 196 266 242    Cardiac Enzymes Recent Labs  Lab 08/16/18 1004 08/20/18 1056 08/20/18 1716 08/20/18 2324  TROPONINI 1.95* 0.75* 1.74* 5.76*    Recent Labs  Lab 08/15/18 1845  TROPIPOC 0.88*      BNP Recent Labs  Lab 08/15/18 1649 08/20/18 1258  BNP 930.7* 1,184.0*     DDimer No results for input(s): DDIMER in the last 168 hours.   Radiology    Dg Chest Port 1 View  Result Date: 08/21/2018 CLINICAL DATA:  Cardiac arrest. EXAM: PORTABLE CHEST 1 VIEW COMPARISON:  Radiographs of August 20, 2018. FINDINGS: Stable cardiomegaly. Mild central pulmonary vascular congestion is noted. Endotracheal and nasogastric tubes are unchanged in position. Left internal jugular catheter is unchanged in position. No pneumothorax is noted. Stable bibasilar subsegmental atelectasis or edema is noted with probable small pleural effusions. Bony thorax is unremarkable. IMPRESSION: Stable cardiomegaly with central pulmonary vascular congestion. Stable support apparatus. Stable bibasilar atelectasis or edema is noted with probable small pleural effusions. Electronically Signed   By: Marijo Conception, M.D.   On: 08/21/2018 08:28   Dg Chest Port 1 View  Result Date: 08/20/2018 CLINICAL DATA:  Status post central line placement EXAM: PORTABLE CHEST 1 VIEW COMPARISON:  08/15/2018 FINDINGS: Endotracheal tube is now noted in satisfactory position. Nasogastric catheter extends into the stomach although the tip is not visualized on this image. Left jugular central line is noted in the proximal superior vena cava. No pneumothorax is noted. Mild vascular congestion and bibasilar atelectatic changes are seen. IMPRESSION: Tubes and lines as described above. Vascular congestion and bibasilar atelectatic changes. Electronically Signed   By: Inez Catalina M.D.   On: 08/20/2018 12:41   Dg Abd Portable 1v  Result Date: 08/20/2018 CLINICAL DATA:  OG tube placement EXAM: PORTABLE ABDOMEN - 1 VIEW COMPARISON:  None. FINDINGS: Orogastric tube with the tip projecting over the stomach. There is relative paucity of bowel gas. There is no evidence of pneumoperitoneum, portal venous gas or pneumatosis. There are no pathologic  calcifications along the expected course of the ureters. The osseous structures are unremarkable. IMPRESSION: Orogastric tube with the tip projecting over the stomach. Electronically Signed   By: Kathreen Devoid   On: 08/20/2018 12:39    Cardiac Studies   August 20, 2018 echo - Left ventricle: Systolic function was severely reduced. The   estimated ejection fraction was in the range of 25% to 30%.   Dyskinesis and scarring of the apical myocardium. Akinesis of the   mid-apicalanteroseptal, anterior, and anterolateral myocardium;   consistent with infarction in the distribution of the left   anterior descending coronary artery; unchanged from the study of   08/18/2018. Doppler parameters are consistent with restrictive  physiology, indicative of decreased left ventricular diastolic   compliance and/or increased left atrial pressure. No evidence of   thrombus. - Mitral valve: Calcified annulus. There was severe regurgitation   directed centrally. The acceleration rate of the regurgitant jet   was reduced, consistent with a low dP/dt. Severe regurgitation is   suggested by pulmonary vein systolic flow reversal. - Left atrium: The atrium was severely dilated. - Tricuspid valve: There was mild-moderate regurgitation directed   centrally. - Pulmonary arteries: Systolic pressure was mildly increased. PA   peak pressure: 43 mm Hg (S).  Impressions:  - Compared to the previous study images, the large LV apical   thrombus is no longer present. Wall motion and EF are unchanged,   but the mitral insufficiency appears worse.  TTE 08/18/18 Study Conclusions  - Left ventricle: The cavity size was normal. Wall thickness was increased in a pattern of mild LVH. Systolic function was severely reduced. The estimated ejection fraction was in the range of 25% to 30%. Diffuse hypokinesis. There is akinesis of the anteroseptal, anterior, and anterolateral myocardium. There was a large, 3.3  cm (L) x 1.8 cm (W), spherical, fixed, apical anteriorthrombusassociated with an akinetic segment. - Aortic valve: Trileaflet; mildly thickened, mildly calcified leaflets. - Mitral valve: There was moderate regurgitation. - Left atrium: The atrium was mildly dilated. - Tricuspid valve: There was mild regurgitation. - Pulmonary arteries: Systolic pressure was moderately increased. PA peak pressure: 59 mm Hg (S).  Cath 08/19/18  Severe multivessel CAD including left main. Distal left main 85%.  Diffusely diseased and totally occluded LAD in the mid segment. Proximal 80 to 99% obstruction. Diagonals are totally occluded.  80% ostial circumflex, 90% proximal first obtuse marginal, and total occlusion of the circumflex beyond the first marginal. Diffuse disease in the first marginal.  Dominant right coronary with segmental 70% distal stenosis, 85% distal RCA beyond the PDA but before the first large left ventricular branch. Diffuse disease in the proximal to midportion of the large right ventricular branch and the second posterolateral branch.  Left ventriculography and hemodynamic recordings were not performed because of LV apical thrombus.  Recommendations:   T CTS evaluation for surgical revascularization.  Guideline directed therapy for systolic heart failure: SGLT2 therapy for diabetes, resume ARB but preferably Entresto for decreased LV function, beta-blocker therapy as tolerated, and diuresis as needed for symptom control.  Monitor kidney function closely.  Anticoagulation for acute coronary syndrome and LV thrombus  Patient Profile     62 y.o. male with history of CAD and remote placement of an LAD stent 20 years ago, diabetes mellitus, presented with findings of acute biventricular heart failure and delayed presentation of STEMI.  Left ventricular apical thrombus present.  Cardiac catheterization showed extensive multivessel CAD.  During work-up, developed  monomorphic VT arrest with rapid resuscitation on August 22.  Assessment & Plan    1. VT arrest: Thankfully, it seems he will make a complete neurological recovery.  It is very likely that his left ventricular apical thrombus embolized, not sure where it went but probably not to his brain.  On IV amiodarone.  Brought up the issue of implantable defibrillators and he immediately and vehemently gestured "NO!".  Not sure why and not sure he really understands the purpose of ICDs.  We will revisit the issue when he is able to communicate fully.  Plan to transition IV amiodarone to long-term p.o. amiodarone when he can swallow. 2. CHF: Her left ventricular systolic dysfunction.  Uncertain  how much viable myocardium still be present.  EF 25-30 %.  Once renal function stabilizes will consider starting R AAS inhibitors, preferably Entresto.  Will start carvedilol once extubated. 3. CAD: Dr. Guy Sandifer evaluation is that he is not a good candidate for bypass, early due to very poor target vessels for grafting and high likelihood of perioperative myocardial infarction.  Will review options for high risk left main/left circumflex PCI with one of my interventional colleagues.  May need cardiac MRI to assess viability to see if this is a worthwhile endeavor first.  Cardiac MRI with contrast and coronary angiography based procedures will have to wait until renal function stabilizes.  On aspirin and statin. 4. LV thrombus: Initial large thrombus is probably embolized, but high risk for redeveloping LV clot.  Will require anticoagulation for about 12 months, probably with warfarin. 5. AKI: Renal function is worsening.  Monitor daily.  Hopefully this will be a very short-term problem since his cardiac arrest was only a 60-minute long event.  Also have contrast nephrotoxicity from procedure performed on August 22.  He does have diabetes mellitus as well.  Avoid further contrast, NSAIDs, other nephrotoxic agents.  He does not  appear to require any diuretics at this time. Baseline uncertain, but lowest creatinine during this admission was 1.6   For questions or updates, please contact [place] Please consult [email or web address] for contact info under Cardiology/STEMI.      Signed, Sanda Klein, MD  08/21/2018, 10:34 AM

## 2018-08-22 ENCOUNTER — Inpatient Hospital Stay (HOSPITAL_COMMUNITY): Payer: BLUE CROSS/BLUE SHIELD

## 2018-08-22 DIAGNOSIS — G9340 Encephalopathy, unspecified: Secondary | ICD-10-CM

## 2018-08-22 DIAGNOSIS — I639 Cerebral infarction, unspecified: Secondary | ICD-10-CM | POA: Diagnosis not present

## 2018-08-22 LAB — URINALYSIS, ROUTINE W REFLEX MICROSCOPIC
BILIRUBIN URINE: NEGATIVE
GLUCOSE, UA: NEGATIVE mg/dL
Ketones, ur: NEGATIVE mg/dL
LEUKOCYTES UA: NEGATIVE
Nitrite: NEGATIVE
PH: 5 (ref 5.0–8.0)
Protein, ur: 30 mg/dL — AB
SPECIFIC GRAVITY, URINE: 1.018 (ref 1.005–1.030)

## 2018-08-22 LAB — CREATININE, URINE, RANDOM: Creatinine, Urine: 233.55 mg/dL

## 2018-08-22 LAB — GLUCOSE, CAPILLARY
GLUCOSE-CAPILLARY: 230 mg/dL — AB (ref 70–99)
GLUCOSE-CAPILLARY: 211 mg/dL — AB (ref 70–99)
GLUCOSE-CAPILLARY: 216 mg/dL — AB (ref 70–99)
Glucose-Capillary: 218 mg/dL — ABNORMAL HIGH (ref 70–99)
Glucose-Capillary: 190 mg/dL — ABNORMAL HIGH (ref 70–99)
Glucose-Capillary: 246 mg/dL — ABNORMAL HIGH (ref 70–99)
Glucose-Capillary: 226 mg/dL — ABNORMAL HIGH (ref 70–99)
Glucose-Capillary: 216 mg/dL — ABNORMAL HIGH (ref 70–99)

## 2018-08-22 LAB — CBC
HCT: 38.9 % — ABNORMAL LOW (ref 39.0–52.0)
Hemoglobin: 12.2 g/dL — ABNORMAL LOW (ref 13.0–17.0)
MCH: 28.1 pg (ref 26.0–34.0)
MCHC: 31.4 g/dL (ref 30.0–36.0)
MCV: 89.6 fL (ref 78.0–100.0)
PLATELETS: 225 10*3/uL (ref 150–400)
RBC: 4.34 MIL/uL (ref 4.22–5.81)
RDW: 14.8 % (ref 11.5–15.5)
WBC: 17.2 10*3/uL — AB (ref 4.0–10.5)

## 2018-08-22 LAB — BASIC METABOLIC PANEL
Anion gap: 15 (ref 5–15)
BUN: 41 mg/dL — ABNORMAL HIGH (ref 8–23)
CALCIUM: 8.2 mg/dL — AB (ref 8.9–10.3)
CO2: 21 mmol/L — ABNORMAL LOW (ref 22–32)
CREATININE: 3.47 mg/dL — AB (ref 0.61–1.24)
Chloride: 103 mmol/L (ref 98–111)
GFR, EST AFRICAN AMERICAN: 20 mL/min — AB (ref >60)
GFR, EST NON AFRICAN AMERICAN: 17 mL/min — AB (ref >60)
Glucose, Bld: 251 mg/dL — ABNORMAL HIGH (ref 70–99)
Potassium: 4.2 mmol/L (ref 3.5–5.1)
SODIUM: 139 mmol/L (ref 135–145)

## 2018-08-22 LAB — POCT I-STAT 3, ART BLOOD GAS (G3+)
ACID-BASE DEFICIT: 7 mmol/L — AB (ref 0.0–2.0)
ACID-BASE DEFICIT: 5 mmol/L — AB (ref 0.0–2.0)
BICARBONATE: 19.6 mmol/L — AB (ref 20.0–28.0)
Bicarbonate: 17.6 mmol/L — ABNORMAL LOW (ref 20.0–28.0)
O2 Saturation: 88 %
O2 Saturation: 92 %
PH ART: 7.328 — AB (ref 7.350–7.450)
PO2 ART: 58 mmHg — AB (ref 83.0–108.0)
PO2 ART: 65 mmHg — AB (ref 83.0–108.0)
Patient temperature: 98.4
TCO2: 19 mmol/L — ABNORMAL LOW (ref 22–32)
TCO2: 21 mmol/L — AB (ref 22–32)
pCO2 arterial: 33.6 mmHg (ref 32.0–48.0)
pCO2 arterial: 33.1 mmHg (ref 32.0–48.0)
pH, Arterial: 7.379 (ref 7.350–7.450)

## 2018-08-22 LAB — HEMOGLOBIN A1C
HEMOGLOBIN A1C: 8.2 % — AB (ref 4.8–5.6)
Mean Plasma Glucose: 188.64 mg/dL

## 2018-08-22 LAB — HEPARIN LEVEL (UNFRACTIONATED)
HEPARIN UNFRACTIONATED: 0.88 [IU]/mL — AB (ref 0.30–0.70)
HEPARIN UNFRACTIONATED: 0.7 [IU]/mL (ref 0.30–0.70)
HEPARIN UNFRACTIONATED: 0.27 [IU]/mL — AB (ref 0.30–0.70)

## 2018-08-22 LAB — LIPID PANEL
CHOLESTEROL: 111 mg/dL (ref 0–200)
HDL: 32 mg/dL — ABNORMAL LOW (ref >40)
LDL CALC: 59 mg/dL (ref 0–99)
TRIGLYCERIDES: 98 mg/dL (ref <150)
Total CHOL/HDL Ratio: 3.5 RATIO
VLDL: 20 mg/dL (ref 0–40)

## 2018-08-22 LAB — SODIUM, URINE, RANDOM: Sodium, Ur: <10 mmol/L

## 2018-08-22 LAB — AMMONIA: Ammonia: 18 umol/L (ref 9–35)

## 2018-08-22 LAB — TSH: TSH: 2.029 u[IU]/mL (ref 0.350–4.500)

## 2018-08-22 MED ORDER — ACETAMINOPHEN-CODEINE #3 300-30 MG PO TABS
2.0000 | ORAL_TABLET | Freq: Four times a day (QID) | ORAL | Status: DC | PRN
  Administered 2018-08-22 – 2018-09-05 (×23): 2 via ORAL
  Filled 2018-08-22 (×25): qty 2

## 2018-08-22 MED ORDER — FUROSEMIDE 10 MG/ML IJ SOLN
120.0000 mg | Freq: Three times a day (TID) | INTRAVENOUS | Status: DC
  Administered 2018-08-22 – 2018-08-26 (×12): 120 mg via INTRAVENOUS
  Filled 2018-08-22 (×2): qty 10
  Filled 2018-08-22: qty 12
  Filled 2018-08-22: qty 10
  Filled 2018-08-22: qty 4
  Filled 2018-08-22: qty 12
  Filled 2018-08-22: qty 10
  Filled 2018-08-22 (×5): qty 12
  Filled 2018-08-22 (×2): qty 10
  Filled 2018-08-22: qty 12

## 2018-08-22 MED ORDER — GLIMEPIRIDE 4 MG PO TABS
4.0000 mg | ORAL_TABLET | Freq: Every day | ORAL | Status: DC
  Administered 2018-08-23 – 2018-08-24 (×2): 4 mg via ORAL
  Filled 2018-08-22 (×2): qty 1

## 2018-08-22 MED ORDER — FUROSEMIDE 10 MG/ML IJ SOLN
80.0000 mg | Freq: Once | INTRAMUSCULAR | Status: AC
  Administered 2018-08-22: 80 mg via INTRAVENOUS
  Filled 2018-08-22: qty 8

## 2018-08-22 MED ORDER — HEPARIN (PORCINE) IN NACL 100-0.45 UNIT/ML-% IJ SOLN
1500.0000 [IU]/h | INTRAMUSCULAR | Status: DC
  Administered 2018-08-22: 1300 [IU]/h via INTRAVENOUS
  Administered 2018-08-23: 1350 [IU]/h via INTRAVENOUS
  Administered 2018-08-24 (×2): 1450 [IU]/h via INTRAVENOUS
  Administered 2018-08-25: 1550 [IU]/h via INTRAVENOUS
  Administered 2018-08-26 (×2): 1600 [IU]/h via INTRAVENOUS
  Administered 2018-08-27: 1500 [IU]/h via INTRAVENOUS
  Filled 2018-08-22 (×8): qty 250

## 2018-08-22 MED ORDER — RESOURCE THICKENUP CLEAR PO POWD
ORAL | Status: DC | PRN
  Filled 2018-08-22: qty 125

## 2018-08-22 MED ORDER — STROKE: EARLY STAGES OF RECOVERY BOOK
Freq: Once | Status: AC
  Administered 2018-08-22: 06:00:00
  Filled 2018-08-22: qty 1

## 2018-08-22 NOTE — Plan of Care (Signed)
  Problem: Skin Integrity: Goal: Risk for impaired skin integrity will decrease Outcome: Progressing   Problem: Activity: Goal: Ability to return to baseline activity level will improve Outcome: Progressing

## 2018-08-22 NOTE — Progress Notes (Addendum)
PULMONARY / CRITICAL CARE MEDICINE   Name: Jose Myers MRN: [id number] DOB: 1956/04/22    ADMISSION DATE:  08/15/2018 CONSULTATION DATE:  08/20/2018  REFERRING MD:  B. Christopher  CHIEF COMPLAINT:  V-fib arrest  HISTORY OF PRESENT ILLNESS:   This is a 62 year old maleadmitted 08/15/2018 with history of diabetes and HTNwho ruled in for non-STEMI, presenting with systolic congestive heart failure and acute renal insufficiency who had been medically stabilized. His cath 8/21 revealedsevere three-vessel coronary artery disease and ejection fraction on echocardiogram is impaired at 25 to 30%and an LV thrombus.Heparin was started.He appearedto be a suitable candidate to proceed with coronary artery surgical revascularization, which was scheduled for 08/23/2018.He went into v-fib/ v-tach arrest on 08/20/2018 .He was resuscitated with CPR, he received epi x 2 and was defibrillated x 1. Total code time was 16 minutes with 6 minutes of ROSC in between. He was given Mag 2 grams, and bolused with amiodarone and started on gtt.PCCM was asked to consult for acute respiratory failure and medical management. He did well over the night and was extubated 8/24.  On the PM of 8/24 the patient developed some "speech difficulty," for which an MRI was scheduled. While at MRI the patient became restless, tachypnic and c/o of SOB while lying flat.  MRI was not able to be performed and the patient was transported back to [facility]. Around 0200 the patient appeared to be confused and tried to put his nasal cannula in his mouth, and began to have slurred speech and arm drifts. Code Stroke was activated and a stat head CT showed a small hypodensity within the right inferior cerebellum that may represent an acute or subacute infarction. Of note, that patient had been of IV heparin since 8/22 for an LV mural thrombus.  PAST MEDICAL HISTORY :  He  has a past medical history of Coronary artery disease, Diabetes  mellitus without complication (HCC), and Hypertension.  PAST SURGICAL HISTORY: He  has a past surgical history that includes Cardiac catheterization; Eye surgery; CORONARY ANGIOGRAPHY (N/A, 08/19/2018); RIGHT HEART CATH (N/A, 08/19/2018); and Percutaneous coronary stent intervention (pci-s) (04/11/2003).  No Known Allergies  No current facility-administered medications on file prior to encounter.    Current Outpatient Medications on File Prior to Encounter  Medication Sig  . Apple Cid Vn-Grn Tea-Bit Or-Cr (APPLE CIDER VINEGAR PLUS) TABS Take 1 tablet by mouth daily.  . carvedilol (COREG) 6.25 MG tablet Take 6.25 mg by mouth 2 (two) times daily with a meal.  . clomiPHENE (CLOMID) 50 MG tablet Take 25 mg by mouth daily.  Marland Kitchen glimepiride (AMARYL) 4 MG tablet Take 4 mg by mouth 2 (two) times daily.  Marland Kitchen LORazepam (ATIVAN) 1 MG tablet Take 0.5-1 mg by mouth daily as needed for anxiety.  Marland Kitchen losartan (COZAAR) 100 MG tablet Take 100 mg by mouth daily.  . metFORMIN (GLUCOPHAGE) 500 MG tablet Take 500 mg by mouth 3 (three) times daily.   . Omega 3 340 MG CPDR Take 1 capsule by mouth at bedtime.  . Prenat-FePoly-Fered-FA-Omega 3 (DUET DHA 430 PO) Take 1 capsule by mouth daily.  . sertraline (ZOLOFT) 50 MG tablet Take 25 mg by mouth daily.  . traZODone (DESYREL) 50 MG tablet Take 50 mg by mouth at bedtime as needed.  . verapamil (VERELAN PM) 240 MG 24 hr capsule Take 240 mg by mouth 2 (two) times daily.     FAMILY HISTORY:  His family history includes Diabetes Mellitus II in his mother; Throat cancer in  his brother.  SOCIAL HISTORY: He  reports that he has never smoked. He has never used smokeless tobacco. He reports that he drinks alcohol.  SUBJECTIVE:  C/o right rib pain from "CPR". No SOB  VITAL SIGNS: BP (!) 114/91   Pulse (!) 102   Temp 98.4 F (36.9 C) (Oral)   Resp (!) 35   Ht 5\' 9"  (1.753 m)   Wt 93.2 kg Comment: scale a  SpO2 92%   BMI 30.33 kg/m   HEMODYNAMICS: CVP:  [11  mmHg-31 mmHg] 14 mmHg  VENTILATOR SETTINGS: Vent Mode: PSV;CPAP FiO2 (%):  [40 %-45 %] 45 % Set Rate:  [18 bmp] 18 bmp Vt Set:  [570 mL] 570 mL PEEP:  [8 cmH20] 8 cmH20 Pressure Support:  [5 cmH20] 5 cmH20 Plateau Pressure:  [19 cmH20] 19 cmH20  INTAKE / OUTPUT: I/O last 3 completed shifts: In: 1921 [P.O.:120; I.V.:1630.6; NG/GT:120; IV Piggyback:50.4] Out: 820 [Urine:420; Emesis/NG output:400] UOP 180 cc in past 24 hr. Received Lasix 80 mg at 0600 with no output by 09:00  PHYSICAL EXAMINATION: General:  WD/WN NAD Neuro:  No focal findings HEENT:  [place]/AT; PERRL, EOMI; O-P unremarkable Cardiovascular:  RRR, no m/r/g Lungs:  Crackles and bronchial BS LL bilaterally Abdomen:  Supple, NT, no H-S-megaly Musculoskeletal:  No active joints Skin:  No C/C/E  LABS:  BMET Recent Labs  Lab 08/20/18 1056 08/21/18 0520 08/22/18 0337  NA 138 138 139  K 4.7 5.4* 4.2  CL 105 106 103  CO2 14* 21* 21*  BUN 21 30* 41*  CREATININE 2.01* 2.56* 3.47*  GLUCOSE 374* 224* 251*    Electrolytes Recent Labs  Lab 08/20/18 1056 08/20/18 1329 08/21/18 0340 08/21/18 0520 08/22/18 0337  CALCIUM 8.0*  --   --  7.8* 8.2*  MG  --  2.6* SPECIMEN HEMOLYZED. HEMOLYSIS MAY AFFECT INTEGRITY OF RESULTS. 2.5*  --   PHOS  --  5.6*  --  5.1*  --     CBC Recent Labs  Lab 08/20/18 1056 08/21/18 0340 08/22/18 0337  WBC 14.6* 17.5* 17.2*  HGB 13.7 13.4 12.2*  HCT 43.7 41.3 38.9*  PLT 266 242 225    Coag's No results for input(s): APTT, INR in the last 168 hours.  Sepsis Markers Recent Labs  Lab 08/20/18 1056 08/20/18 1329 08/20/18 1716  LATICACIDVEN 8.6* 2.9* 2.1*  PROCALCITON  --  <0.10  --     ABG Recent Labs  Lab 08/21/18 0439 08/22/18 0318 08/22/18 0822  PHART 7.366 7.328* 7.379  PCO2ART 34.0 33.6 33.1  PO2ART 113.0* 58.0* 65.0*    Liver Enzymes Recent Labs  Lab 08/20/18 1056 08/21/18 0340 08/21/18 0520  AST 81* SPECIMEN HEMOLYZED. HEMOLYSIS MAY AFFECT INTEGRITY OF  RESULTS. 91*  ALT 55* SPECIMEN HEMOLYZED. HEMOLYSIS MAY AFFECT INTEGRITY OF RESULTS. 63*  ALKPHOS 43 SPECIMEN HEMOLYZED. HEMOLYSIS MAY AFFECT INTEGRITY OF RESULTS. 41  BILITOT 1.5* SPECIMEN HEMOLYZED. HEMOLYSIS MAY AFFECT INTEGRITY OF RESULTS. 1.4*  ALBUMIN 2.8* SPECIMEN HEMOLYZED. HEMOLYSIS MAY AFFECT INTEGRITY OF RESULTS. 2.8*    Cardiac Enzymes Recent Labs  Lab 08/20/18 1056 08/20/18 1716 08/20/18 2324  TROPONINI 0.75* 1.74* 5.76*    Glucose Recent Labs  Lab 08/21/18 1748 08/21/18 1943 08/21/18 2322 08/22/18 0234 08/22/18 0521 08/22/18 0739  GLUCAP 207* 252* 256* 218* 230* 211*    Imaging Ct Head Code Stroke Wo Contrast  Result Date: 08/22/2018 CLINICAL DATA:  Code stroke. 62 y/o M; aphasia, vision loss, altered mental status, dysarthria. EXAM: CT HEAD WITHOUT CONTRAST  TECHNIQUE: Contiguous axial images were obtained from the base of the skull through the vertex without intravenous contrast. COMPARISON:  None. FINDINGS: Brain: Hypodensity in the right inferior cerebellum may represent an acute or subacute infarction (series 5, image 53). No additional area of acute infarction, hemorrhage, or mass effect identified. No extra-axial collection, hydrocephalus, or herniation. Small chronic infarct within the left putamen extending into corona radiata and caudate body. Mild-to-moderate chronic microvascular ischemic changes and volume loss of the brain. Vascular: Calcific atherosclerosis of the carotid siphons. No hyperdense vessel identified. Skull: Normal. Negative for fracture or focal lesion. Sinuses/Orbits: No acute finding. Other: Chronic left lamina papyracea fracture with medial herniation of extraconal fat. ASPECTS [facility] Stroke Program Early CT Score) - Ganglionic level infarction (caudate, lentiform nuclei, internal capsule, insula, M1-M3 cortex): 7 - Supraganglionic infarction (M4-M6 cortex): 3 Total score (0-10 with 10 being normal): 10 IMPRESSION: 1. Small hypodensity  within the right inferior cerebellum which may represent an acute or subacute infarction. 2. No additional acute stroke, hemorrhage, or mass effect identified. 3. Mild-to-moderate chronic microvascular ischemic changes and volume loss of the brain for age. 4. 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 5. ASPECTS is 10 These results were called by telephone at the time of interpretation on 08/22/2018 at 3:03 am to Dr. Amada Jupiter, who verbally acknowledged these results. Electronically Signed   By: Mitzi Hansen M.D.   On: 08/22/2018 03:06     STUDIES:  Head CT as above  SIGNIFICANT EVENTS: On the PM of 8/24 the patient developed some "speech difficulty," for which an MRI was scheduled. While at MRI the patient became restless, tachypnic and c/o of SOB while lying flat.  MRI was not able to be performed and the patient was transported back to [facility]. Around 0200 the patient appeared to be confused and tried to put his nasal cannula in his mouth, and began to have slurred speech and arm drifts. Code Stroke was activated and a stat head CT showed a small hypodensity within the right inferior cerebellum that may represent an acute or subacute infarction. Of note, that patient had been of IV heparin since 8/22 for an LV mural thrombus  LINES/TUBES: L IJ TLC  DISCUSSION: 53) A 62 year old maleadmitted with history of diabetes and HTNwho ruled in for non-STEMI, ,presenting with systolic congestive heart failure and acute renal insufficiency. His cath 8/21 revealedsevere three-vessel coronary artery disease and ejection fraction on echocardiogram is impaired at 25 to 30%and an LV thrombus, for which heparin was started.He appearedto be a suitable candidate to proceed with coronary artery surgical revascularization, which was scheduled for 08/23/2018.He had a v-fib/ v-tach arrest on 08/20/2018, was successfully resuscitated; with CPR and  received epi x 2 and was defibrillated x 1. His post-arrest course was uneventful. 2) On the evening 8/24-8/25 developed an acute or subacute cerebellar infarction while on heparin.  3) UOP has declined with rising creatinine.  ASSESSMENT / PLAN:  PULMONARY A: Exam today compatible with alveolar-filling process and ABGs worsening, as he now has borderline oxygenation on a 30% VM. Also WBC higher. P:   Will obtain PCXR to see if process more c/w pulmonary edema as opposed to bilateral pneumonia.  CARDIOVASCULAR A:  CAD, CHF with EF 25-30%, s/p VT arrest P:  Cardiol had proposed an ICD, to which the patient was initially not amenable. Cardiol to re-discuss with patient.  RENAL A:   Worsening renal function with poor UOP and no immediate  response to furosemide. POC bladder U/S suggest ~400 cc  P:   Will replace Foley to better monitor UOP. May need Nephrol consult  HEMATOLOGIC A:   Mild [place] anemia likely related to acute illness P:  Follow trend  INFECTIOUS A:   Afebrile but increasing WBC P:   Will check PCXR as above and culture. Hold ABX for now  ENDOCRINE A:   Hx DM and sugars remain elevated, requiring SSI   P:   Resume home meds  NEUROLOGIC A:   Acute or subacute cerebellar infarction while on heparin. Possibly secondary to previous LV thrombus. P:   IV heparin at stroke protocol range and will likely need long-term anticoag.   Critical care time: 40 min  Pulmonary and Critical Care Medicine [facility] Pager: [phone]  08/22/2018, 8:43 AM   Addendum: Ordered U/A and spot urine Na and creat

## 2018-08-22 NOTE — Evaluation (Signed)
Speech Language Pathology Evaluation Patient Details Name: Jose Myers MRN: [id number] DOB: 12-09-1956 Today's Date: 08/22/2018 Time: [id number] SLP Time Calculation (min) (ACUTE ONLY): 12 min  Problem List:  Patient Active Problem List   Diagnosis Date Noted  . Stroke due to embolism (HCC) 08/22/2018    Class: Acute  . Cardiac arrest (HCC) 08/20/2018  . NSTEMI (non-ST elevated myocardial infarction) (HCC)   . Hypervolemia   . Acute CHF (congestive heart failure) (HCC) 08/15/2018  . Acute renal failure (ARF) (HCC) 08/15/2018  . Elevated troponin 08/15/2018  . Abdominal pain 08/15/2018  . Type 2 diabetes mellitus with renal manifestations (HCC) 08/15/2018  . Pain in left knee 03/08/2018   Past Medical History:  Past Medical History:  Diagnosis Date  . Coronary artery disease   . Diabetes mellitus without complication (HCC)   . Hypertension    Past Surgical History:  Past Surgical History:  Procedure Laterality Date  . CARDIAC CATHETERIZATION    . CORONARY ANGIOGRAPHY N/A 08/19/2018   Procedure: CORONARY ANGIOGRAPHY (CATH LAB);  Surgeon: Lyn RecordsSmith, Henry W, MD;  [facility];  Service: Cardiovascular;  Laterality: N/A;  . EYE SURGERY    . PERCUTANEOUS CORONARY STENT INTERVENTION (PCI-S)  04/11/2003   LAD  . RIGHT HEART CATH N/A 08/19/2018   Procedure: RIGHT HEART CATH;  Surgeon: Lyn RecordsSmith, Henry W, MD;  [facility];  Service: Cardiovascular;  Laterality: N/A;   HPI:  Pt is a 62 year old male admitted with non-STEMI s/p cardiac cath 8/21. PT had a VT arrest on 8/23 requiring CPR, epi x2, defibrillation x1. He was intubated 8/23-8/24. On the evening of 8/24 pt had new onset AMS, for which stroke w/u was pursued. CT Head showed questionable cerebellar hypodensity, although per neurology note, this is likely artifact. MRI pending. PMH: DM, HTN, CAD   Assessment / Plan / Recommendation Clinical Impression  Pt appears to be at his cognitive-linguistic  baseline and both he and his wife deny any persistent acute changes in mentation. He believes that his frustration last night was misconstrued as AMS. Regardless, he presents today with adequate divided attention in a highly distracting environment. He communicates fluently and clearly. No significant cognitive impairments are noted and neurology suspects that hypodensity on CT may be artifact. Recommend additional SLP f/u for swallowing only.    SLP Assessment  SLP Recommendation/Assessment: Patient does not need any further Speech Lanaguage Pathology Services SLP Visit Diagnosis: Cognitive communication deficit (R41.841)    Follow Up Recommendations  None(for cognition/communication; tbd for dysphagia)    Frequency and Duration min 2x/week         SLP Evaluation Cognition  Overall Cognitive Status: Within Functional Limits for tasks assessed Orientation Level: Oriented to person;Oriented to place       Comprehension  Auditory Comprehension Overall Auditory Comprehension: Appears within functional limits for tasks assessed    Expression Expression Primary Mode of Expression: Verbal Verbal Expression Overall Verbal Expression: Appears within functional limits for tasks assessed   Oral / Motor  Oral Motor/Sensory Function Overall Oral Motor/Sensory Function: Within functional limits Motor Speech Overall Motor Speech: Appears within functional limits for tasks assessed   GO                    Jose Myers, Jose Myers 08/22/2018, 11:12 AM  Jose Myers, M.A. CCC-SLP [phone]

## 2018-08-22 NOTE — Progress Notes (Signed)
Stat AGB drawn.  Pt paced on veni-mask on 10L. Pt SP02 now is 95%.   Will continue to monitor.

## 2018-08-22 NOTE — Evaluation (Signed)
Clinical/Bedside Swallow Evaluation Patient Details  Name: Jose Myers MRN: [id number] Date of Birth: 14-Aug-1956  Today's Date: 08/22/2018 Time: SLP Start Time (ACUTE ONLY): 0954 SLP Stop Time (ACUTE ONLY): 1010 SLP Time Calculation (min) (ACUTE ONLY): 16 min  Past Medical History:  Past Medical History:  Diagnosis Date  . Coronary artery disease   . Diabetes mellitus without complication (HCC)   . Hypertension    Past Surgical History:  Past Surgical History:  Procedure Laterality Date  . CARDIAC CATHETERIZATION    . CORONARY ANGIOGRAPHY N/A 08/19/2018   Procedure: CORONARY ANGIOGRAPHY (CATH LAB);  Surgeon: Lyn Records, MD;  Location: [facility]VASIVE CV LAB;  Service: Cardiovascular;  Laterality: N/A;  . EYE SURGERY    . PERCUTANEOUS CORONARY STENT INTERVENTION (PCI-S)  04/11/2003   LAD  . RIGHT HEART CATH N/A 08/19/2018   Procedure: RIGHT HEART CATH;  Surgeon: Lyn Records, MD;  Location: [facility] INVASIVE CV LAB;  Service: Cardiovascular;  Laterality: N/A;   HPI:  Pt is a 62 year old male admitted with non-STEMI s/p cardiac cath 8/21. PT had a VT arrest on 8/23 requiring CPR, epi x2, defibrillation x1. He was intubated 8/23-8/24. On the evening of 8/24 pt had new onset AMS, for which stroke w/u was pursued. CT Head showed questionable cerebellar hypodensity, although per neurology note, this is likely artifact. MRI pending. PMH: DM, HTN, CAD   Assessment / Plan / Recommendation Clinical Impression  Pt's voice sounds strong after brief intubation, although he and his wife describe it as "raspy." He has immediate coughing with thin liquids, which is not appreciated with any other consistency tested. Upon completion of PO trials, pt reclined to reposition in bed, with onset of nausea, coughing, and production of very small amounts of applesauce. This is concerning for regurgitation in the setting of intermittent N/V this admission, although could also be suggestive of pharyngeal  residue. Recommend starting with Dys 2 diet, nectar thick liquids with use of aspriation and esophageal precautions. Pt was instructed to stop PO intake if coughing or regurgitation is observed again. Will f/u for tolerance versus need for further testing. SLP Visit Diagnosis: Dysphagia, unspecified (R13.10)    Aspiration Risk  Moderate aspiration risk;Mild aspiration risk    Diet Recommendation Dysphagia 2 (Fine chop);Nectar-thick liquid   Liquid Administration via: Cup;Straw Medication Administration: Crushed with puree Supervision: Patient able to self feed;Full supervision/cueing for compensatory strategies Compensations: Slow rate;Small sips/bites;Follow solids with liquid Postural Changes: Seated upright at 90 degrees;Remain upright for at least 30 minutes after po intake    Other  Recommendations Oral Care Recommendations: Oral care BID Other Recommendations: Have oral suction available   Follow up Recommendations (tba)      Frequency and Duration min 2x/week  2 weeks       Prognosis Prognosis for Safe Diet Advancement: Good      Swallow Study   General HPI: Pt is a 62 year old male admitted with non-STEMI s/p cardiac cath 8/21. PT had a VT arrest on 8/23 requiring CPR, epi x2, defibrillation x1. He was intubated 8/23-8/24. On the evening of 8/24 pt had new onset AMS, for which stroke w/u was pursued. CT Head showed questionable cerebellar hypodensity, although per neurology note, this is likely artifact. MRI pending. PMH: DM, HTN, CAD Type of Study: Bedside Swallow Evaluation Previous Swallow Assessment: none in chart Diet Prior to this Study: NPO Temperature Spikes Noted: No Respiratory Status: Venti-mask History of Recent Intubation: Yes Length of Intubations (days): 2 days Date extubated:  08/21/18 Behavior/Cognition: Alert;Cooperative Oral Cavity Assessment: Within Functional Limits Oral Care Completed by SLP: No Oral Cavity - Dentition: Adequate natural  dentition Vision: Functional for self-feeding Self-Feeding Abilities: Able to feed self Patient Positioning: Upright in bed(EOB) Baseline Vocal Quality: Other (comment)("raspy" per pt/wife) Volitional Cough: Other (Comment)(guarded due to rib pain) Volitional Swallow: Able to elicit    Oral/Motor/Sensory Function Overall Oral Motor/Sensory Function: Within functional limits   Ice Chips Ice chips: Within functional limits Presentation: Spoon   Thin Liquid Thin Liquid: Impaired Presentation: Cup;Self Fed;Straw Pharyngeal  Phase Impairments: Cough - Immediate    Nectar Thick Nectar Thick Liquid: Impaired Presentation: Self Fed;Straw Pharyngeal Phase Impairments: Cough - Delayed   Honey Thick Honey Thick Liquid: Not tested   Puree Puree: Impaired Presentation: Spoon;Self Fed Pharyngeal Phase Impairments: Cough - Delayed   Solid     Solid: Impaired Presentation: Self Fed Pharyngeal Phase Impairments: Cough - Delayed      Jose Myers, Jose Myers 08/22/2018,11:08 AM   Jose Myers, M.A. CCC-SLP [phone]

## 2018-08-22 NOTE — Progress Notes (Signed)
STROKE TEAM PROGRESS NOTE   SUBJECTIVE (INTERVAL HISTORY) His sister and wife are at the bedside.  Pt walked with PT/OT in the hallway with and without walker, no ataxia. Then sat down in bed for blood draw, no focal neuro deficit on exam. MRI at 1:30am no stroke but CT on 3am concerning for subacute right cerebellar infarct, which should be artifact.   Pt denies any confusion or delirium last night, but complained that he was angry about nurse last night and he was in right rib pain likely due to rib fracture with CPR as well as LBP lying in bed.    OBJECTIVE Vitals:   08/22/18 0645 08/22/18 0700 08/22/18 0737 08/22/18 0805  BP: 110/83 108/85  (!) 114/91  Pulse: (!) 104 (!) 103  (!) 102  Resp: (!) 30 (!) 28  (!) 35  Temp:   98.4 F (36.9 C)   TempSrc:   Oral   SpO2: 93% 93%  92%  Weight:      Height:        CBC:  Recent Labs  Lab 08/21/18 0340 08/22/18 0337  WBC 17.5* 17.2*  HGB 13.4 12.2*  HCT 41.3 38.9*  MCV 87.9 89.6  PLT 242 225    Basic Metabolic Panel:  Recent Labs  Lab 08/20/18 1329 08/21/18 0340 08/21/18 0520 08/22/18 0337  NA  --   --  138 139  K  --   --  5.4* 4.2  CL  --   --  106 103  CO2  --   --  21* 21*  GLUCOSE  --   --  224* 251*  BUN  --   --  30* 41*  CREATININE  --   --  2.56* 3.47*  CALCIUM  --   --  7.8* 8.2*  MG 2.6* SPECIMEN HEMOLYZED. HEMOLYSIS MAY AFFECT INTEGRITY OF RESULTS. 2.5*  --   PHOS 5.6*  --  5.1*  --     Lipid Panel:     Component Value Date/Time   CHOL 111 08/22/2018 0337   TRIG 98 08/22/2018 0337   HDL 32 (L) 08/22/2018 0337   CHOLHDL 3.5 08/22/2018 0337   VLDL 20 08/22/2018 0337   LDLCALC 59 08/22/2018 0337   HgbA1c:  Lab Results  Component Value Date   HGBA1C 8.2 (H) 08/22/2018   Urine Drug Screen: No results found for: LABOPIA, COCAINSCRNUR, LABBENZ, AMPHETMU, THCU, LABBARB  Alcohol Level No results found for: ETH  IMAGING  Ct Head Code Stroke Wo Contrast 08/22/2018 IMPRESSION:  1. Small hypodensity  within the right inferior cerebellum which may represent an acute or subacute infarction which is artifact on my reading since MRI brain no  Acute infarct done one and half hours prior to this CT.  2. No additional acute stroke, hemorrhage, or mass effect identified.  3. Mild-to-moderate chronic microvascular ischemic changes and volume loss of the brain for age.  4. 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  5. ASPECTS is 10   MRI brain 08/22/18 - 2:50am No acute infarct.   MRI / MRA Head and Neck - pending   Dg Chest Port 1 View 08/21/2018 IMPRESSION:  Stable cardiomegaly with central pulmonary vascular congestion. Stable support apparatus. Stable bibasilar atelectasis or edema is noted with probable small pleural effusions.    Transthoracic Echocardiogram 08/20/18 - Compared to the previous study images, the large LV apical   thrombus is no longer present. Wall motion and  EF are unchanged,   but the mitral insufficiency appears worse.  TTE 08/18/18 - Left ventricle: The cavity size was normal. Wall thickness was   increased in a pattern of mild LVH. Systolic function was   severely reduced. The estimated ejection fraction was in the   range of 25% to 30%. Diffuse hypokinesis. There is akinesis of   the anteroseptal, anterior, and anterolateral myocardium. There   was a large, 3.3 cm (L) x 1.8 cm (W), spherical, fixed, apical   anteriorthrombusassociated with an akinetic segment. - Aortic valve: Trileaflet; mildly thickened, mildly calcified   leaflets. - Mitral valve: There was moderate regurgitation. - Left atrium: The atrium was mildly dilated. - Tricuspid valve: There was mild regurgitation. - Pulmonary arteries: Systolic pressure was moderately increased.   PA peak pressure: 59 mm Hg (S).  Bilateral Carotid Dopplers - pending   PHYSICAL EXAM Temp:  [97.9 F (36.6 C)-98.4 F (36.9 C)] 98.4 F (36.9 C) (08/25  0737) Pulse Rate:  [92-113] 102 (08/25 0900) Resp:  [15-36] 32 (08/25 0900) BP: (93-137)/(56-92) 104/84 (08/25 0900) SpO2:  [89 %-100 %] 91 % (08/25 0900) FiO2 (%):  [40 %-45 %] 45 % (08/25 0805)  General - Well nourished, well developed, in no apparent distress, with face mask, complained of right rib pain.  Ophthalmologic - fundi not visualized due to noncooperation.  Cardiovascular - Regular rhythm with no murmur, mild tachycardia.  Mental Status -  Level of arousal and orientation to time, place, and person were intact. Language including expression, naming, repetition, comprehension was assessed and found intact. Fund of Knowledge was assessed and was intact.  Cranial Nerves II - XII - II - Visual field intact OU. III, IV, VI - Extraocular movements intact. V - Facial sensation intact bilaterally. VII - Facial movement intact bilaterally. VIII - Hearing & vestibular intact bilaterally. X - Palate elevates symmetrically. XI - Chin turning & shoulder shrug intact bilaterally. XII - Tongue protrusion intact.  Motor Strength - The patient's strength was normal in all extremities and pronator drift was absent.  Bulk was normal and fasciculations were absent.   Motor Tone - Muscle tone was assessed at the neck and appendages and was normal.  Reflexes - The patient's reflexes were symmetrical in all extremities and he had no pathological reflexes.  Sensory - Light touch, temperature/pinprick were assessed and were symmetrical.    Coordination - The patient had normal movements in the hands and feet with no ataxia or dysmetria.  Tremor was absent.  Gait and Station - walked with PT/OT in the hallway with and without walker, minimal assistance, no gait or truncal ataxia.   ASSESSMENT/PLAN Jose Myers is a 62 y.o. male with history of HTN, DM, and CAD with recent STEMI / VT arrest presenting with AMS. He did not receive IV t-PA due to Heparin therapy for LV  thrombus.  Encephalopathy vs. agitation vs. Sundowning vs. Delirium  Overnight RN reported confusion and delirium  Pt complained of angry on RN, right rib pain due to possible rib fracture with CPR, and LBP lying in bed  worsening AKI on CKD with Cr. 1.61->2.01->2.56->3.47  Worsening leukocytosis WBC 9.7->14.6->17.5->17.2  Tachycardia overnight up to 130s  Tachypnea overnight with increased RR  This am much improved mental status with no focal neuro deficit  CXR 08/22/18 pending  UA 08/15/18 no UTI  Correct metabolic derrangement as per primary team  Quiet time at night  Avoid sleep wake cycle disturbance  Pain management  To rule out stroke - CT concerns of stroke likely artifact   CT head - Small hypodensity within the right inferior cerebellum which may represent an acute or subacute infarction. However, MRI brain one hour prior was negative for acute stroke, indicating the CT concern was artifact.  Small chronic infarction within the left putamen.  MRI head - pending  MRA head - pending  Carotid Doppler - pending  2D Echo 08/18/18 - EF 25-30%, large LV thrombus  2D echo 08/20/18 - EF 25-30%, LV thrombus resolved  LDL - 59  HgbA1c - 8.2  VTE prophylaxis - IV Heparin  Diet - NPO  No antithrombotic prior to admission, now on aspirin 81 mg daily and heparin IV. Continue heparin IV for stroke prevention and LV thrombus treatment  Patient will be counseled to be compliant with his antithrombotic medications  Ongoing aggressive stroke risk factor management  Therapy recommendations:  pending  Disposition:  Pending  STEMI, LV thrombus, VT arrest s/p CPR  Management as per cardiology  CXR pending today to rule in or rule out right rib fracture  On amiodarone  On ASA and lipitor  Hypertension  BP mildly low . BP goal normotensive  Hyperlipidemia  Lipid lowering medication PTA:  none  LDL 59, goal < 70  Current lipid lowering medication:  Lipitor 40 mg daily  Continue statin at discharge  Diabetes  HgbA1c 8.2, goal < 7.0  Uncontrolled  SSI  CBG monitoring  PCP follow up for better DM management  Other Stroke Risk Factors  Advanced age  Obesity, Body mass index is 30.33 kg/m., recommend weight loss, diet and exercise as appropriate   Hx stroke/TIA by imaging - left putamen   Coronary artery disease with recent STEMI and VT arrest  Other Active Problems  Leukocytosis - 17.2  Creatinine - 2.56 -> 3.47  Hospital day # 7  This patient is critically ill due to confusion, encephalopathy, STEMI, LV thrombus, cardiac arrest s/p CPR and at significant risk of neurological worsening, death form cardiac arrest, stroke, bleeding, heart failure. This patient's care requires constant monitoring of vital signs, hemodynamics, respiratory and cardiac monitoring, review of multiple databases, neurological assessment, discussion with family, other specialists and medical decision making of high complexity. I spent 30 minutes of neurocritical care time in the care of this patient.  Marvel Plan, MD PhD Stroke Neurology 08/22/2018 10:24 AM    To contact Stroke Continuity provider, please refer to [email or web address]. After hours, contact General Neurology

## 2018-08-22 NOTE — Consult Note (Addendum)
Renal Service Consult Note [facility]  Jose Rasmussenommy L Burrowes 08/22/2018 Jose Krabbeobert D Jaimie Redditt Requesting Physician: Dr Cristal Deerhristopher  Reason for Consult:  Acute renal failure HPI: The patient is a 62 y.o. year-old with hx of HTN, DM2 and CAD sp stent LAD in 2004, admitted on 8/18 w/ abd pain and found to have decompensated CHF and +troponins.  Started on IV lasix and heparin.  ECHO showed low EF25% and pt underwent heart cath on 8/22.  Creat on admit was 2.5 but improved to 1.6 pre heart cath on 8/22. Creat on 8/23 was 1.6 , stable. Pt had VT/ VF arrest on 8/23 am treated w/ shock and epi x 2. Went to ICU. Recovered quickly so was not cooled.  Now creat is rising up to 2.56 yesterday and 3.47 today.  Foley was placed this am returning about 300 cc UOP.  UOP on 8/22 was 2.5 L, on 8/23 615 cc , on 8/24 180 cc and so far today 355 cc.  Got IV lasix 80 mg x 1.  CXR showing bilat pulm edema.   Patient is drowsy but awakens, not in distress on HFNC at 10L / min.  Lying at 20deg in bed.  No c/o.  Family at bedside. No hx kidney disease.     old chart:  Apr 2004 - admitted for recurrent presyncope, +Cardiolite w/ ant-lat ischemia. Cath showed 70% mid LAD. Obesity.  Had intervention to LAD.  Creat was 1.0.  dc'd home.    Relevant IP meds:  - iv lasix 40 bid 8/19 - 23  Current meds:  - ecasa 81/ atorvastatin 40/ glimepiride 4mg  qd/ SSI aspart insulin/ sertraline/ omega-3/ IV amio gtt / IV heparin gtt   No nsaids / acei/ ARB given here.       date   Creat  Notes  2008  1.05  Aug 15 2018 2.59  aug 20 1.92   aug 21 1.78   aug 22 1.63  70 cc dye for heart cath  aug 23 1.61  Cardiac arrest in the morning  aug 24 2.56   aug 25 3.47    ROS  denies CP  no joint pain   no HA  no blurry vision  no rash  no diarrhea  no nausea/ vomiting   Past Medical History  Past Medical History:  Diagnosis Date  . Coronary artery disease   . Diabetes mellitus without complication (HCC)   .  Hypertension    Past Surgical History  Past Surgical History:  Procedure Laterality Date  . CARDIAC CATHETERIZATION    . CORONARY ANGIOGRAPHY N/A 08/19/2018   Procedure: CORONARY ANGIOGRAPHY (CATH LAB);  Surgeon: Lyn RecordsSmith, Henry W, MD;  [facility];  Service: Cardiovascular;  Laterality: N/A;  . EYE SURGERY    . PERCUTANEOUS CORONARY STENT INTERVENTION (PCI-S)  04/11/2003   LAD  . RIGHT HEART CATH N/A 08/19/2018   Procedure: RIGHT HEART CATH;  Surgeon: Lyn RecordsSmith, Henry W, MD;  [facility];  Service: Cardiovascular;  Laterality: N/A;   Family History  Family History  Problem Relation Age of Onset  . Diabetes Mellitus II Mother   . Throat cancer Brother    Social History  reports that he has never smoked. He has never used smokeless tobacco. He reports that he drinks alcohol. His drug history is not on file. Allergies No Known Allergies Home medications Prior to Admission medications   Medication Sig Start Date End Date Taking? Authorizing Provider  Apple Cid Vn-Grn Tea-Bit Or-Cr (APPLE CIDER VINEGAR PLUS) TABS Take 1 tablet by mouth daily.   Yes [provider]  carvedilol (COREG) 6.25 MG tablet Take 6.25 mg by mouth 2 (two) times daily with a meal.   Yes [provider]  clomiPHENE (CLOMID) 50 MG tablet Take 25 mg by mouth daily.   Yes [provider]  glimepiride (AMARYL) 4 MG tablet Take 4 mg by mouth 2 (two) times daily.   Yes [provider]  LORazepam (ATIVAN) 1 MG tablet Take 0.5-1 mg by mouth daily as needed for anxiety.   Yes [provider]  losartan (COZAAR) 100 MG tablet Take 100 mg by mouth daily. 06/13/18  Yes [provider]  metFORMIN (GLUCOPHAGE) 500 MG tablet Take 500 mg by mouth 3 (three) times daily.    Yes [provider]  Omega 3 340 MG CPDR Take 1 capsule by mouth at bedtime.   Yes [provider]  Prenat-FePoly-Fered-FA-Omega 3 (DUET DHA 430 PO) Take 1 capsule by  mouth daily.   Yes [provider]  sertraline (ZOLOFT) 50 MG tablet Take 25 mg by mouth daily. 07/13/18  Yes [provider]  traZODone (DESYREL) 50 MG tablet Take 50 mg by mouth at bedtime as needed. 08/13/18  Yes [provider]  verapamil (VERELAN PM) 240 MG 24 hr capsule Take 240 mg by mouth 2 (two) times daily.    Yes [provider]   Liver Function Tests Recent Labs  Lab 08/20/18 1056 08/21/18 0340 08/21/18 0520  AST 81* SPECIMEN HEMOLYZED. HEMOLYSIS MAY AFFECT INTEGRITY OF RESULTS. 91*  ALT 55* SPECIMEN HEMOLYZED. HEMOLYSIS MAY AFFECT INTEGRITY OF RESULTS. 63*  ALKPHOS 43 SPECIMEN HEMOLYZED. HEMOLYSIS MAY AFFECT INTEGRITY OF RESULTS. 41  BILITOT 1.5* SPECIMEN HEMOLYZED. HEMOLYSIS MAY AFFECT INTEGRITY OF RESULTS. 1.4*  PROT 5.5* SPECIMEN HEMOLYZED. HEMOLYSIS MAY AFFECT INTEGRITY OF RESULTS. 5.5*  ALBUMIN 2.8* SPECIMEN HEMOLYZED. HEMOLYSIS MAY AFFECT INTEGRITY OF RESULTS. 2.8*   Recent Labs  Lab 08/15/18 1527  LIPASE 31   CBC Recent Labs  Lab 08/20/18 1056 08/21/18 0340 08/22/18 0337  WBC 14.6* 17.5* 17.2*  HGB 13.7 13.4 12.2*  HCT 43.7 41.3 38.9*  MCV 90.9 87.9 89.6  PLT 266 242 225   Basic Metabolic Panel Recent Labs  Lab 08/17/18 0431 08/18/18 0914 08/19/18 0442 08/20/18 0257 08/20/18 1056 08/20/18 1329 08/21/18 0520 08/22/18 0337  NA 137 138 139 137 138  --  138 139  K 3.5 3.7 3.5 3.6 4.7  --  5.4* 4.2  CL 106 104 106 106 105  --  106 103  CO2 24 22 23 23  14*  --  21* 21*  GLUCOSE 171* 245* 162* 236* 374*  --  224* 251*  BUN 28* 23 23 20 21   --  30* 41*  CREATININE 1.92* 1.78* 1.63* 1.61* 2.01*  --  2.56* 3.47*  CALCIUM 7.9* 8.4* 8.4* 8.4* 8.0*  --  7.8* 8.2*  PHOS  --   --   --   --   --  5.6* 5.1*  --    Iron/TIBC/Ferritin/ %Sat No results found for: IRON, TIBC, FERRITIN, IRONPCTSAT  Vitals:   08/22/18 1042 08/22/18 1100 08/22/18 1134 08/22/18 1200  BP:  100/78  101/78  Pulse:  93  92  Resp:  (!) 27  (!) 27   Temp:   98.4 F (36.9 C)   TempSrc:   Oral   SpO2: 95% 94%  95%  Weight:  Height:       Exam Gen adult male, on nasal O2, not in distress No rash, cyanosis or gangrene Sclera anicteric, throat clear  No jvd or bruits Chest clear bilat ant and lat RRR no MRG Abd soft ntnd no mass or ascites +bs GU normal male w foley cath in place MS no joint effusions or deformity Ext trace- 1+ LE edema, no wounds or ulcers Neuro is alert, Ox 3 , nf, gen weakness    Home meds:  - losartan 100 qd  - glimepiride 4mg  bid/ metformin 500 tid  - sertraline 25 qd/ trazodone 50 mg qd  - "discontinued" per PTA list > verapamil 240 bid/ clomiphene 25 qd/ lorazepam prn/ omega 3  - "needing review" > coreg 6.25 bid   Impression: 1. Acute renal failure - suspect ATN due to arrest and/or contrast.  Oliguric but is still making some urine.  Pulm edema on CXR. Recommend high-dose IV lasix for now.  If gets worse we can do dialysis but would hold off for now. Have d/w family.  Otherwise supportive care. Avoid acei/ arb for now.  2. Acute MI/ severe 3V CAD - by LHC on 8/22 3. CM LVEF 25-30% prior to arrest 4. SP VF/VT arrest - on 8/23 am 5. DM2 6. HTN - BP's low here and BP meds on hold appropriately   Plan - will follow.   Vinson Moselle MD [employer] pager [phone]   08/22/2018, 3:09 PM

## 2018-08-22 NOTE — Consult Note (Addendum)
Neurology Consultation Reason for Consult: Difficulty speaking Referring Physician: Code stroke Cristal Deer, [place] attending)  CC: Confusion  History is obtained from: Patient  HPI: Jose Myers is a 62 y.o. male a history of CAD, DM who presented to the hospital with delayed presentation of a STEMI with heart failure and left ventricular thrombus.  He subsequently had a VT arrest on 8/23 was transferred to the ICU.  He was extubated on 8/24 and was doing fairly well, though he had reported nausea and had some vomiting throughout the day.  At 2 AM, he was reportedly relatively normal and then subsequently his nurse noticed that he was very confused and therefore code stroke was activated.  LKW: 2 AM tpa given?: no, anticoagulation  ROS: Unable to obtain due to altered mental status.   Past Medical History:  Diagnosis Date  . Coronary artery disease   . Diabetes mellitus without complication (HCC)   . Hypertension      Family History  Problem Relation Age of Onset  . Diabetes Mellitus II Mother   . Throat cancer Brother      Social History:  reports that he has never smoked. He has never used smokeless tobacco. He reports that he drinks alcohol. His drug history is not on file.   Exam: Current vital signs: BP 107/87   Pulse 100   Temp 97.9 F (36.6 C) (Oral)   Resp 16   Ht 5\' 9"  (1.753 m)   Wt 93.2 kg Comment: scale a  SpO2 98%   BMI 30.33 kg/m  Vital signs in last 24 hours: Temp:  [97.9 F (36.6 C)-98.7 F (37.1 C)] 97.9 F (36.6 C) (08/24 2315) Pulse Rate:  [65-109] 100 (08/24 2315) Resp:  [15-29] 16 (08/24 2315) BP: (96-120)/(67-91) 107/87 (08/24 2315) SpO2:  [93 %-100 %] 98 % (08/24 2315) FiO2 (%):  [40 %-50 %] 40 % (08/24 1125)   Physical Exam  Constitutional: Appears well-developed and well-nourished.  Psych: Affect appropriate to situation Eyes: No scleral injection HENT: No OP obstrucion Head: Normocephalic.  Cardiovascular: Normal  rate and regular rhythm.  Respiratory: Effort normal, non-labored breathing GI: Soft.  No distension. There is no tenderness.  Skin: WDI  Neuro: Mental Status: Patient is awake, alert, he is able to tell me his name, month, but gives his age is 62 He had gives rambling answers, mentioning trips with his brother and other tangential responses. Cranial Nerves: II: Visual Fields are full. Pupils are equal, round, and reactive to light.  III,IV, VI: EOMI without ptosis or diploplia.  V: Facial sensation is symmetric to temperature VII: Facial movement is symmetric.  VIII: hearing is intact to voice X: Uvula elevates symmetrically XI: Shoulder shrug is symmetric. XII: tongue is midline without atrophy or fasciculations.  Motor: Tone is normal. Bulk is normal. 5/5 strength was present in all four extremities.  Sensory: Sensation is symmetric to light touch and temperature in the arms and legs. Cerebellar: FNF and HKS are intact bilaterally  I have reviewed labs in epic and the results pertinent to this consultation are: Creatinine 2.56 ABG- 7.3/33/ 58  I have reviewed the images obtained: CT head- possible subacute cerebellar stroke  Impression: 62 year old male with left ventricular thrombus acute confusion most consistent with delirium though ischemic infarct is certainly possible.  At this time, I think IV TPA would have a higher risk than benefit given his low NIH score, possibility that his vomiting earlier was due to a cerebellar(that this could be artifact)  infarct, full dose anticoagulation.  The cerebellar stroke(if actually present) is small, and with an active left ventricular thrombus I do think that I would favor continued heparinization, though I would favor using stroke protocol range.  Recommendations: - HgbA1c, fasting lipid panel - MRI, MRA  of the brain without contrast - Frequent neuro checks - Carotid dopplers - Prophylactic therapy-ASA, anticoagulation with  heparin - Risk factor modification - Telemetry monitoring - PT consult, OT consult, Speech consult - Stroke team to follow - TSH, ammonia - management of hypoxia per CCM  Ritta SlotMcNeill Chriss Mannan, MD [facility] [phone]  If 7pm- 7am, please page neurology on call as listed in AMION.

## 2018-08-22 NOTE — Progress Notes (Signed)
ANTICOAGULATION CONSULT NOTE   Pharmacy Consult:  Heparin Indication: chest pain/ACS/LV apical thrombus / acute CVA possible  No Known Allergies  Patient Measurements: Height: 5\' 9"  (175.3 cm) Weight: 205 lb 6.4 oz (93.2 kg)(scale a) IBW/kg (Calculated) : 70.7 Heparin Dosing Weight: 90 kg  Vital Signs: Temp: 98.4 F (36.9 C) (08/25 1134) Temp Source: Oral (08/25 1134) BP: 104/80 (08/25 1500) Pulse Rate: 93 (08/25 1500)  Labs: Recent Labs    08/20/18 1056 08/20/18 1716  08/20/18 2324 08/21/18 0340 08/21/18 0520 08/22/18 0337 08/22/18 1354  HGB 13.7  --   --   --  13.4  --  12.2*  --   HCT 43.7  --   --   --  41.3  --  38.9*  --   PLT 266  --   --   --  242  --  225  --   HEPARINUNFRC  --   --    < >  --  0.63  --  0.88* 0.70  CREATININE 2.01*  --   --   --   --  2.56* 3.47*  --   TROPONINI 0.75* 1.74*  --  5.76*  --   --   --   --    < > = values in this interval not displayed.    Estimated Creatinine Clearance: 24.2 mL/min (A) (by C-G formula based on SCr of 3.47 mg/dL (H)).   Assessment: [age] YOM presents with 9 days of abdominal pain and increasing distension. Pt is s/p cath on 8/22 which showed multivessel CAD but not surgical candidate  - discussing PCI.  s/p  ECHO showed LV apical thrombus> started on heparin drip - follow up ECHO post VT arrest did not show thrombus but plan to continue anticoagulation. S/P MS changes last PM but head CT neg - unsure if small TIA v ICU confusion - plan to run HL at low end goal.  [facility] HL 0.7 drawn correctly from CL.  > goal, no bleeding noted.  Goal of Therapy:  Heparin level 0.3-0.5 units/ml Monitor platelets by anticoagulation protocol: Yes    Plan: ,  Hold heparin for 1 hr then decrease heparin to 1300 units/hr. Avoid any boluses in the future.  Check HL in 6hr from restart Daily heparin level and CBC Continue to monitor H&H and platelets   Leota SauersLisa Amory Simonetti Pharm.D. CPP,  BCPS Clinical Pharmacist [phone] 08/22/2018 3:54 PM

## 2018-08-22 NOTE — Progress Notes (Signed)
ANTICOAGULATION CONSULT NOTE   Pharmacy Consult:  Heparin Indication: chest pain/ACS/LV apical thrombus / acute CVA possible  No Known Allergies  Patient Measurements: Height: 5\' 9"  (175.3 cm) Weight: 205 lb 6.4 oz (93.2 kg)(scale a) IBW/kg (Calculated) : 70.7 Heparin Dosing Weight: 90 kg  Vital Signs: Temp: 98.4 F (36.9 C) (08/25 1900) Temp Source: Axillary (08/25 1900) BP: 102/79 (08/25 2100) Pulse Rate: 87 (08/25 2100)  Labs: Recent Labs    08/20/18 1056 08/20/18 1716  08/20/18 2324 08/21/18 0340 08/21/18 0520 08/22/18 0337 08/22/18 1354 08/22/18 2240  HGB 13.7  --   --   --  13.4  --  12.2*  --   --   HCT 43.7  --   --   --  41.3  --  38.9*  --   --   PLT 266  --   --   --  242  --  225  --   --   HEPARINUNFRC  --   --    < >  --  0.63  --  0.88* 0.70 0.27*  CREATININE 2.01*  --   --   --   --  2.56* 3.47*  --   --   TROPONINI 0.75* 1.74*  --  5.76*  --   --   --   --   --    < > = values in this interval not displayed.    Estimated Creatinine Clearance: 24.2 mL/min (A) (by C-G formula based on SCr of 3.47 mg/dL (H)).   Assessment: [age] YOM presents with 9 days of abdominal pain and increasing distension. Pt is s/p cath on 8/22 which showed multivessel CAD but not surgical candidate  - discussing PCI.  s/p  ECHO showed LV apical thrombus> started on heparin drip - follow up ECHO post VT arrest did not show thrombus but plan to continue anticoagulation. S/P MS changes last PM but head CT neg - unsure if small TIA v ICU confusion - plan to run HL at low end goal.   Heparin level is slightly subtherapeutic now on 1300 units/hr. No bleeding reported by RN  Goal of Therapy:  Heparin level 0.3-0.5 units/ml Monitor platelets by anticoagulation protocol: Yes    Plan: ,  Increase IV heparin slightly to 1350 units/hr  Check HL in 6 hrs  Daily heparin level and CBC Continue to monitor H&H and platelets   Vinnie LevelBenjamin Madora Barletta, PharmD., BCPS Clinical  Pharmacist Clinical phone for 08/22/18: [phone]

## 2018-08-22 NOTE — Progress Notes (Signed)
RN called d/t pt w/ dry mouth on VM.  Pt placed on HFNC for humidification and oxygenation.  Sats vary between 92-97% currently.  RN at bedside and aware.

## 2018-08-22 NOTE — Evaluation (Addendum)
Physical Therapy Evaluation Patient Details Name: Jose Myers MRN: [id number] DOB: 1956-08-20 Today's Date: 08/22/2018   History of Present Illness  62 y.o. male admitted 08/15/18 ruled in for non STEMI, presenting with systolic CHF and acute renal insufficiency.  EF 25-30% with LV thrombus, candidate for coronary artery revas scheduled for 8/26. On 8/23, he went into v-tach arrest, CPR, epi x2,  defibrillated x1. Code stroke called on 8/24 for speech difficulty and confusion,  CT showed possible cerebellum infarct, which neurology believes may be artifact, repeat MRI ordered at time of PT eval and is pending 8/25.     Clinical Impression  Pt admitted with above diagnosis. Pt currently with functional limitations due to the deficits listed below (see PT Problem List). PTA pt entirely independent with all mobility and ADLs. Upon eval pt reports exhaustion and is agitated, has a plethora of staff and family in and out of room in distracting environment. Coordination and strength in extremities intact, balance deficits noted and will need to be further assessed next PT visit as this may be due to fatigue and commotion of environment/lines and equipment at this time. Pt walking with and without RW with min A at times for stability. Primary c/o is chest/rib pain from CPR.  Pt will benefit from skilled PT to increase their independence and safety with mobility to allow discharge to the venue listed below.    VSS on 10L venturi mask      Follow Up Recommendations Outpatient PT;Supervision for mobility/OOB    Equipment Recommendations  (TBD)    Recommendations for Other Services OT consult     Precautions / Restrictions Precautions Precautions: Fall Restrictions Weight Bearing Restrictions: Yes(Mobility eval post-code-stroke pending. Bedrest. )      Mobility  Bed Mobility Overal bed mobility: Needs Assistance Bed Mobility: Supine to Sit     Supine to sit: Supervision         Transfers Overall transfer level: Needs assistance Equipment used: Rolling walker (2 wheeled);None Transfers: Sit to/from Stand Sit to Stand: Min assist         General transfer comment: min A to stabilize   Ambulation/Gait Ambulation/Gait assistance: Min guard;Min assist Gait Distance (Feet): 80 Feet Assistive device: Rolling walker (2 wheeled);None Gait Pattern/deviations: Step-through pattern;Step-to pattern Gait velocity: decreased   General Gait Details: Pt walking with and without RW. balance deficits and staggering noted at times, however not ataxic.   Stairs            Wheelchair Mobility    Modified Rankin (Stroke Patients Only)       Balance Overall balance assessment: Needs assistance   Sitting balance-Leahy Scale: Good       Standing balance-Leahy Scale: Fair                               Pertinent Vitals/Pain Pain Assessment: Faces Faces Pain Scale: Hurts even more Pain Location: R chest likely from CPR Pain Descriptors / Indicators: Aching Pain Intervention(s): Limited activity within patient's tolerance;Monitored during session    Home Living Family/patient expects to be discharged to:: Private residence Living Arrangements: Spouse/significant other Available Help at Discharge: Family;Available 24 hours/day Type of Home: House Home Access: Stairs to enter   [employer] of Steps: 3 Home Layout: One level Home Equipment: None      Prior Function Level of Independence: Independent         Comments: active, drives, no AD.  Hand Dominance        Extremity/Trunk Assessment   Upper Extremity Assessment Upper Extremity Assessment: Overall WFL for tasks assessed(WNL coordination and light touch )    Lower Extremity Assessment Lower Extremity Assessment: Overall WFL for tasks assessed(WNL coordination and light touch)       Communication   Communication: No difficulties  Cognition  Arousal/Alertness: Awake/alert Behavior During Therapy: WFL for tasks assessed/performed Overall Cognitive Status: Within Functional Limits for tasks assessed                                        General Comments      Exercises     Assessment/Plan    PT Assessment Patient needs continued PT services  PT Problem List Decreased strength;Decreased activity tolerance;Decreased balance;Decreased mobility;Pain       PT Treatment Interventions DME instruction;Gait training;Stair training;Functional mobility training;Therapeutic activities;Therapeutic exercise    PT Goals (Current goals can be found in the Care Plan section)  Acute Rehab PT Goals Patient Stated Goal: get some rest  PT Goal Formulation: With patient Time For Goal Achievement: 09/05/18 Potential to Achieve Goals: Good    Frequency Min 4X/week   Barriers to discharge Inaccessible home environment      Co-evaluation               AM-PAC PT "6 Clicks" Daily Activity  Outcome Measure Difficulty turning over in bed (including adjusting bedclothes, sheets and blankets)?: A Little Difficulty moving from lying on back to sitting on the side of the bed? : A Little Difficulty sitting down on and standing up from a chair with arms (e.g., wheelchair, bedside commode, etc,.)?: A Little Help needed moving to and from a bed to chair (including a wheelchair)?: A Little Help needed walking in hospital room?: A Little Help needed climbing 3-5 steps with a railing? : A Little 6 Click Score: 18    End of Session Equipment Utilized During Treatment: Gait belt Activity Tolerance: Patient tolerated treatment well Patient left: in bed Nurse Communication: Mobility status PT Visit Diagnosis: Unsteadiness on feet (R26.81)    Time: [id number] PT Time Calculation (min) (ACUTE ONLY): 42 min   Charges:   PT Evaluation $PT Eval Moderate Complexity: 1 Mod PT Treatments $Gait Training: 8-22 mins         Jose Myers, PT, DPT Acute Rehab Services Pager: [phone]    Jose Myers 08/22/2018, 10:46 AM

## 2018-08-22 NOTE — Progress Notes (Signed)
ANTICOAGULATION CONSULT NOTE   Pharmacy Consult:  Heparin Indication: chest pain/ACS/LV apical thrombus / acute CVA   No Known Allergies  Patient Measurements: Height: 5\' 9"  (175.3 cm) Weight: 205 lb 6.4 oz (93.2 kg)(scale a) IBW/kg (Calculated) : 70.7 Heparin Dosing Weight: 90 kg  Vital Signs: Temp: 97.9 F (36.6 C) (08/24 2315) Temp Source: Oral (08/24 2315) BP: 107/87 (08/24 2315) Pulse Rate: 100 (08/24 2315)  Labs: Recent Labs    08/20/18 0257 08/20/18 1016 08/20/18 1056 08/20/18 1716 08/20/18 2148 08/20/18 2324 08/21/18 0340 08/21/18 0520  HGB 13.3  --  13.7  --   --   --  13.4  --   HCT 40.2  --  43.7  --   --   --  41.3  --   PLT 196  --  266  --   --   --  242  --   HEPARINUNFRC 0.22* 0.43  --   --  0.31  --  0.63  --   CREATININE 1.61*  --  2.01*  --   --   --   --  2.56*  TROPONINI  --   --  0.75* 1.74*  --  5.76*  --   --     Estimated Creatinine Clearance: 32.9 mL/min (A) (by C-G formula based on SCr of 2.56 mg/dL (H)).   Assessment: [age] YOM presents with 9 days of abdominal pain and increasing distension. Pt is s/p cath on 8/22 which showed multivessel CAD but not surgical candidate  - discussing PCI.  s/p  ECHO showing LV apical thrombus. Pharmacy resumed IV heparin post LHC. S/p VT arrest 8/23    On AM of 8/25, patient had an acute change in mental status and was taken to CT/MRI which was concerning for a possible cerebellar stroke. Since patient has an active left ventricular thrombus, neurology favors continuing IV heparin but aiming for lower heparin goal.    Goal of Therapy:  Heparin level 0.3-0.5 units/ml Monitor platelets by anticoagulation protocol: Yes    Plan:  Decrease heparin to 1500 units/hr. Avoid any boluses in the future.  F/u 8 hr HL  Daily heparin level and CBC Continue to monitor H&H and platelets   Vinnie LevelBenjamin Cyriah Childrey, PharmD., BCPS Clinical Pharmacist Clinical phone for 08/22/18 until 8am: [phone]

## 2018-08-22 NOTE — Progress Notes (Signed)
Patient went down for an MRI at midnight. Prior to going for an MRI, patient was alert and oriented x4. While getting his MRI, the patient became restless, tachypnic and c/o of SOB while lying flat.  MRI was not able to be continued due to those changes.  Patient was transported back to [facility]. Around 0200 the patient appeared to be confused and tried to put his nasal cannula in his mouth. Patient began to have slurred speech and arm drifts. We called CCM and a Code Stroke was activated. Pt was taken down for a STAT CT scan of the head. Pt also c/o of chest pain and an EKG was done at the bedside. Cardiology and CCM was notified about his changes in mental status. ABG's were done and CCM was notified about the results. Patient's wife was also called and notified about his changes over night. Pt is currently resting. Will perform q2 neuro checks on him and continue to monitor for any changes.

## 2018-08-22 NOTE — Code Documentation (Addendum)
Code Stroke  Called by bedside RNs about acute neurologic changes. Staff called [facility] at [employer] and initiated a Code Stroke. Upon arrival, patient was alert, + aphasia, + dysarthria, visual loss to the right. I did page Neuro MD on call. Patient was taken for STAT CT HEAD and then returned to [facility]. Blood sugar > 200.   Cards MD on call and [facility] were notified as well. NIH improved from 4 to a 3. STAT ABG was done as well, PaO2 was 58.

## 2018-08-22 NOTE — Progress Notes (Signed)
Progress Note  Patient Name: Jose Myers Date of Encounter: 08/22/2018  Primary Cardiologist: No primary care provider on file.   Subjective   He was confused overnight, but this morning he appears fully alert and oriented.  Code stroke was activated.  CT shows a small right inferior cerebellar hypodensity.  MRI could not be performed due to the patient's restlessness and orthopnea.  No focal neurological deficits on neurology evaluation, Dr. Roda ShuttersXu. Biggest complaint is right rib pain. Does not have anginal pain and denies shortness of breath or neurological issues.  He was able to walk out in the hallway with physical therapy. Worsening kidney function at a worrisome pace: Creat 1.61->2.01->2.56->3.47 Chest x-ray has just been performed and unfortunately seems to show worsening signs of heart failure/fluid overload. Had to have urinary catheter placed due to postvoid residual approximately 400 mL  Inpatient Medications    Scheduled Meds: . aspirin EC  81 mg Oral Daily  . atorvastatin  40 mg Oral q1800  . [START ON 08/23/2018] glimepiride  4 mg Oral Q breakfast  . insulin aspart  0-9 Units Subcutaneous Q4H  . mouth rinse  15 mL Mouth Rinse BID  . omega-3 acid ethyl esters  1 g Oral QHS  . sertraline  25 mg Oral Daily  . sodium chloride flush  3 mL Intravenous Q12H   Continuous Infusions: . sodium chloride    . amiodarone 30 mg/hr (08/22/18 1029)  . heparin 1,500 Units/hr (08/22/18 1030)   PRN Meds: sodium chloride, acetaminophen **OR** acetaminophen, acetaminophen-codeine, docusate, fentaNYL, LORazepam, ondansetron (ZOFRAN) IV, RESOURCE THICKENUP CLEAR, sodium chloride flush, traZODone   Vital Signs    Vitals:   08/22/18 0800 08/22/18 0805 08/22/18 0900 08/22/18 1042  BP: 117/82 (!) 114/91 104/84   Pulse: (!) 112 (!) 102 (!) 102   Resp: (!) 27 (!) 35 (!) 32   Temp:      TempSrc:      SpO2: (!) 89% 92% 91% 95%  Weight:      Height:        Intake/Output Summary  (Last 24 hours) at 08/22/2018 1102 Last data filed at 08/22/2018 0800 Gross per 24 hour  Intake 857.59 ml  Output 60 ml  Net 797.59 ml   Filed Weights   08/18/18 0431 08/19/18 0515 08/20/18 0459  Weight: 94 kg 93.8 kg 93.2 kg    Telemetry    Mostly sinus rhythm/mild sinus tachycardia.  Did have 3 episodes of 3 beat run of nonsustained VT- Personally Reviewed  ECG    ECG this morning at 2 AM similar to previous tracings with mild sinus tachycardia, minor IVCD with QRS duration just under 120 ms, left axis deviation, old anteroseptal and old inferior infarctions, lateral ST segment depression T wave inversion, prolonged QTc 469 ms- Personally Reviewed  Physical Exam  Frowning, but alert, oriented and coherent GEN: No acute distress.   Neck: No JVD Cardiac: RRR with occasional ectopy, no murmurs, rubs, or gallops.  Respiratory: Clear to auscultation bilaterally.  Tender anterior right rib cage GI: Soft, nontender, non-distended  MS: No edema; No deformity. Neuro:  Nonfocal  Psych: Normal affect   Labs    Chemistry Recent Labs  Lab 08/20/18 1056 08/21/18 0340 08/21/18 0520 08/22/18 0337  NA 138  --  138 139  K 4.7  --  5.4* 4.2  CL 105  --  106 103  CO2 14*  --  21* 21*  GLUCOSE 374*  --  224* 251*  BUN 21  --  30* 41*  CREATININE 2.01*  --  2.56* 3.47*  CALCIUM 8.0*  --  7.8* 8.2*  PROT 5.5* SPECIMEN HEMOLYZED. HEMOLYSIS MAY AFFECT INTEGRITY OF RESULTS. 5.5*  --   ALBUMIN 2.8* SPECIMEN HEMOLYZED. HEMOLYSIS MAY AFFECT INTEGRITY OF RESULTS. 2.8*  --   AST 81* SPECIMEN HEMOLYZED. HEMOLYSIS MAY AFFECT INTEGRITY OF RESULTS. 91*  --   ALT 55* SPECIMEN HEMOLYZED. HEMOLYSIS MAY AFFECT INTEGRITY OF RESULTS. 63*  --   ALKPHOS 43 SPECIMEN HEMOLYZED. HEMOLYSIS MAY AFFECT INTEGRITY OF RESULTS. 41  --   BILITOT 1.5* SPECIMEN HEMOLYZED. HEMOLYSIS MAY AFFECT INTEGRITY OF RESULTS. 1.4*  --   GFRNONAA 33*  --  25* 17*  GFRAA 39*  --  29* 20*  ANIONGAP 19*  --  11 15      Hematology Recent Labs  Lab 08/20/18 1056 08/21/18 0340 08/22/18 0337  WBC 14.6* 17.5* 17.2*  RBC 4.81 4.70 4.34  HGB 13.7 13.4 12.2*  HCT 43.7 41.3 38.9*  MCV 90.9 87.9 89.6  MCH 28.5 28.5 28.1  MCHC 31.4 32.4 31.4  RDW 14.0 14.1 14.8  PLT 266 242 225    Cardiac Enzymes Recent Labs  Lab 08/16/18 1004 08/20/18 1056 08/20/18 1716 08/20/18 2324  TROPONINI 1.95* 0.75* 1.74* 5.76*    Recent Labs  Lab 08/15/18 1845  TROPIPOC 0.88*     BNP Recent Labs  Lab 08/15/18 1649 08/20/18 1258  BNP 930.7* 1,184.0*     DDimer No results for input(s): DDIMER in the last 168 hours.   Radiology    Dg Chest Port 1 View  Result Date: 08/21/2018 CLINICAL DATA:  Cardiac arrest. EXAM: PORTABLE CHEST 1 VIEW COMPARISON:  Radiographs of August 20, 2018. FINDINGS: Stable cardiomegaly. Mild central pulmonary vascular congestion is noted. Endotracheal and nasogastric tubes are unchanged in position. Left internal jugular catheter is unchanged in position. No pneumothorax is noted. Stable bibasilar subsegmental atelectasis or edema is noted with probable small pleural effusions. Bony thorax is unremarkable. IMPRESSION: Stable cardiomegaly with central pulmonary vascular congestion. Stable support apparatus. Stable bibasilar atelectasis or edema is noted with probable small pleural effusions. Electronically Signed   By: Lupita Raider, M.D.   On: 08/21/2018 08:28   Dg Chest Port 1 View  Result Date: 08/20/2018 CLINICAL DATA:  Status post central line placement EXAM: PORTABLE CHEST 1 VIEW COMPARISON:  08/15/2018 FINDINGS: Endotracheal tube is now noted in satisfactory position. Nasogastric catheter extends into the stomach although the tip is not visualized on this image. Left jugular central line is noted in the proximal superior vena cava. No pneumothorax is noted. Mild vascular congestion and bibasilar atelectatic changes are seen. IMPRESSION: Tubes and lines as described above. Vascular  congestion and bibasilar atelectatic changes. Electronically Signed   By: Alcide Clever M.D.   On: 08/20/2018 12:41   Dg Abd Portable 1v  Result Date: 08/20/2018 CLINICAL DATA:  OG tube placement EXAM: PORTABLE ABDOMEN - 1 VIEW COMPARISON:  None. FINDINGS: Orogastric tube with the tip projecting over the stomach. There is relative paucity of bowel gas. There is no evidence of pneumoperitoneum, portal venous gas or pneumatosis. There are no pathologic calcifications along the expected course of the ureters. The osseous structures are unremarkable. IMPRESSION: Orogastric tube with the tip projecting over the stomach. Electronically Signed   By: Elige Ko   On: 08/20/2018 12:39   Ct Head Code Stroke Wo Contrast  Result Date: 08/22/2018 CLINICAL DATA:  Code stroke. 62 y/o M; aphasia,  vision loss, altered mental status, dysarthria. EXAM: CT HEAD WITHOUT CONTRAST TECHNIQUE: Contiguous axial images were obtained from the base of the skull through the vertex without intravenous contrast. COMPARISON:  None. FINDINGS: Brain: Hypodensity in the right inferior cerebellum may represent an acute or subacute infarction (series 5, image 53). No additional area of acute infarction, hemorrhage, or mass effect identified. No extra-axial collection, hydrocephalus, or herniation. Small chronic infarct within the left putamen extending into corona radiata and caudate body. Mild-to-moderate chronic microvascular ischemic changes and volume loss of the brain. Vascular: Calcific atherosclerosis of the carotid siphons. No hyperdense vessel identified. Skull: Normal. Negative for fracture or focal lesion. Sinuses/Orbits: No acute finding. Other: Chronic left lamina papyracea fracture with medial herniation of extraconal fat. ASPECTS [facility] Stroke Program Early CT Score) - Ganglionic level infarction (caudate, lentiform nuclei, internal capsule, insula, M1-M3 cortex): 7 - Supraganglionic infarction (M4-M6 cortex): 3 Total score  (0-10 with 10 being normal): 10 IMPRESSION: 1. Small hypodensity within the right inferior cerebellum which may represent an acute or subacute infarction. 2. No additional acute stroke, hemorrhage, or mass effect identified. 3. Mild-to-moderate chronic microvascular ischemic changes and volume loss of the brain for age. 4. 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 5. ASPECTS is 10 These results were called by telephone at the time of interpretation on 08/22/2018 at 3:03 am to Dr. Amada Jupiter, who verbally acknowledged these results. Electronically Signed   By: Mitzi Hansen M.D.   On: 08/22/2018 03:06    Cardiac Studies   August 20, 2018 echo - Left ventricle: Systolic function was severely reduced. The estimated ejection fraction was in the range of 25% to 30%. Dyskinesis and scarring of the apical myocardium. Akinesis of the mid-apicalanteroseptal, anterior, and anterolateral myocardium; consistent with infarction in the distribution of the left anterior descending coronary artery; unchanged from the study of 08/18/2018. Doppler parameters are consistent with restrictive physiology, indicative of decreased left ventricular diastolic compliance and/or increased left atrial pressure. No evidence of thrombus. - Mitral valve: Calcified annulus. There was severe regurgitation directed centrally. The acceleration rate of the regurgitant jet was reduced, consistent with a low dP/dt. Severe regurgitation is suggested by pulmonary vein systolic flow reversal. - Left atrium: The atrium was severely dilated. - Tricuspid valve: There was mild-moderate regurgitation directed centrally. - Pulmonary arteries: Systolic pressure was mildly increased. PA peak pressure: 43 mm Hg (S).  Impressions:  - Compared to the previous study images, the large LV apical thrombus is no longer present. Wall motion and EF  are unchanged, but the mitral insufficiency appears worse.  TTE8/21/19 Study Conclusions  - Left ventricle: The cavity size was normal. Wall thickness was increased in a pattern of mild LVH. Systolic function was severely reduced. The estimated ejection fraction was in the range of 25% to 30%. Diffuse hypokinesis. There is akinesis of the anteroseptal, anterior, and anterolateral myocardium. There was a large, 3.3 cm (L) x 1.8 cm (W), spherical, fixed, apical anteriorthrombusassociated with an akinetic segment. - Aortic valve: Trileaflet; mildly thickened, mildly calcified leaflets. - Mitral valve: There was moderate regurgitation. - Left atrium: The atrium was mildly dilated. - Tricuspid valve: There was mild regurgitation. - Pulmonary arteries: Systolic pressure was moderately increased. PA peak pressure: 59 mm Hg (S).  Cath 08/19/18  Severe multivessel CAD including left main. Distal left main 85%.  Diffusely diseased and totally occluded LAD in the mid segment. Proximal 80 to 99% obstruction. Diagonals are totally occluded.  80% ostial circumflex, 90% proximal first obtuse marginal, and total occlusion of the circumflex beyond the first marginal. Diffuse disease in the first marginal.  Dominant right coronary with segmental 70% distal stenosis, 85% distal RCA beyond the PDA but before the first large left ventricular branch. Diffuse disease in the proximal to midportion of the large right ventricular branch and the second posterolateral branch.  Left ventriculography and hemodynamic recordings were not performed because of LV apical thrombus.  Recommendations:   T CTS evaluation for surgical revascularization.  Guideline directed therapy for systolic heart failure: SGLT2 therapy for diabetes, resume ARB but preferably Entresto for decreased LV function, beta-blocker therapy as tolerated, and diuresis as needed for symptom control.  Monitor  kidney function closely.  Anticoagulation for acute coronary syndrome and LV thrombus  Patient Profile     62 y.o. male with history of CAD and remote placement of an LAD stent 20 years ago, diabetes mellitus, presented with findings of acute biventricular heart failure and delayed presentation of STEMI.  Left ventricular apical thrombus present.  Cardiac catheterization showed extensive multivessel CAD.  During work-up, developed monomorphic VT arrest with rapid resuscitation on August 22.  Successfully extubated August 24.  Developed acute kidney injury and urinary bladder retention.  Assessment & Plan    1. VT arrest:  Still on intravenous amiodarone which I think we will continue till tomorrow.  Occasional nonsustained VT but generally quiet on telemetry.  No major neurological sequelae from his resuscitation.  Had a more detailed discussion today regarding the role of implantable defibrillators and secondary prevention of sudden cardiac death.  Discussed the implantation procedure, immediate possible complications, long-term issues with defibrillators, follow-up in some detail.  He now appears amenable to defibrillator implantation.  We will discuss with our EP team.  He will require defibrillator for secondary prevention and I think the device should be implanted on this admission whether or not he has additional revascularization procedures. 2. CHF:  He has orthopnea and his chest x-ray shows worsening fluid overload.  Received a dose of diuretics today but immediate urine output was blunted by bladder retention.  He has severe left ventricular systolic dysfunction.  Uncertain how much viable myocardium might still be present.  EF 25-30 %.    Unable to start R AAS inhibitors due to acute renal insufficiency.  For same reason cannot do a cardiac MRI for viability.  Will start carvedilol low-dose today after diuretics have had a chance to lead to volume improvement.. 3. CAD:  Per TCTS evaluation, he  is not a good candidate for bypass,  primarily due to very poor target vessels for grafting and high likelihood of perioperative myocardial infarction.  Will review options for high risk left main/left circumflex PCI with one of my interventional colleagues, any intervention will have to wait for recovery of his renal function. May need cardiac MRI to assess viability to see if this is a worthwhile endeavor first.  Cardiac MRI with contrast and coronary angiography based procedures will have to wait until renal function stabilizes.  On aspirin and statin. 4. LV thrombus: Initial large thrombus is no longer seen and has probably embolized, but he is at high risk for redeveloping LV clot.  Will require anticoagulation for about 12 months, probably with warfarin.  On intravenous heparin for the time being, until we are done with invasive procedures. 5. AKI: Renal function is rapidly worsening.  Monitor daily.  Hopefully this will be a very short-term problem since his cardiac  arrest was only a 16-minute long event.  Also could have contrast nephrotoxicity from procedure performed on August 22 and post renal obstruction due to bladder retention.  He does have diabetes mellitus as well, so quite possibly has some underlying nephropathy.  Avoid further contrast, NSAIDs, other nephrotoxic agents.  Baseline uncertain, but lowest creatinine during this admission was 1.6. He needs diuretics for what is impending pulmonary edema.  We will request nephrology consultation.     For questions or updates, please contact [facility] Please consult [email or web address] for contact info under Cardiology/STEMI.      Signed, Thurmon Fair, MD  08/22/2018, 11:02 AM

## 2018-08-23 ENCOUNTER — Inpatient Hospital Stay (HOSPITAL_COMMUNITY): Payer: BLUE CROSS/BLUE SHIELD

## 2018-08-23 DIAGNOSIS — I236 Thrombosis of atrium, auricular appendage, and ventricle as current complications following acute myocardial infarction: Secondary | ICD-10-CM

## 2018-08-23 DIAGNOSIS — I639 Cerebral infarction, unspecified: Secondary | ICD-10-CM

## 2018-08-23 DIAGNOSIS — R410 Disorientation, unspecified: Secondary | ICD-10-CM

## 2018-08-23 DIAGNOSIS — I2510 Atherosclerotic heart disease of native coronary artery without angina pectoris: Secondary | ICD-10-CM

## 2018-08-23 DIAGNOSIS — G9340 Encephalopathy, unspecified: Secondary | ICD-10-CM

## 2018-08-23 DIAGNOSIS — J9601 Acute respiratory failure with hypoxia: Secondary | ICD-10-CM

## 2018-08-23 LAB — COOXEMETRY PANEL
CARBOXYHEMOGLOBIN: 1.2 % (ref 0.5–1.5)
METHEMOGLOBIN: 0.9 % (ref 0.0–1.5)
O2 SAT: 54.6 %
TOTAL HEMOGLOBIN: 12.5 g/dL (ref 12.0–16.0)

## 2018-08-23 LAB — CBC
HEMATOCRIT: 36.2 % — AB (ref 39.0–52.0)
Hemoglobin: 11.5 g/dL — ABNORMAL LOW (ref 13.0–17.0)
MCH: 28.5 pg (ref 26.0–34.0)
MCHC: 31.8 g/dL (ref 30.0–36.0)
MCV: 89.6 fL (ref 78.0–100.0)
PLATELETS: 182 10*3/uL (ref 150–400)
RBC: 4.04 MIL/uL — ABNORMAL LOW (ref 4.22–5.81)
RDW: 14.7 % (ref 11.5–15.5)
WBC: 14.8 10*3/uL — AB (ref 4.0–10.5)

## 2018-08-23 LAB — RENAL FUNCTION PANEL
Albumin: 2.9 g/dL — ABNORMAL LOW (ref 3.5–5.0)
Anion gap: 11 (ref 5–15)
BUN: 55 mg/dL — AB (ref 8–23)
CHLORIDE: 102 mmol/L (ref 98–111)
CO2: 26 mmol/L (ref 22–32)
CREATININE: 3.56 mg/dL — AB (ref 0.61–1.24)
Calcium: 8.4 mg/dL — ABNORMAL LOW (ref 8.9–10.3)
GFR calc Af Amer: 19 mL/min — ABNORMAL LOW (ref >60)
GFR calc non Af Amer: 17 mL/min — ABNORMAL LOW (ref >60)
Glucose, Bld: 228 mg/dL — ABNORMAL HIGH (ref 70–99)
Phosphorus: 4.8 mg/dL — ABNORMAL HIGH (ref 2.5–4.6)
Potassium: 3.9 mmol/L (ref 3.5–5.1)
SODIUM: 139 mmol/L (ref 135–145)

## 2018-08-23 LAB — GLUCOSE, CAPILLARY
GLUCOSE-CAPILLARY: 202 mg/dL — AB (ref 70–99)
Glucose-Capillary: 225 mg/dL — ABNORMAL HIGH (ref 70–99)
Glucose-Capillary: 175 mg/dL — ABNORMAL HIGH (ref 70–99)
Glucose-Capillary: 201 mg/dL — ABNORMAL HIGH (ref 70–99)
Glucose-Capillary: 250 mg/dL — ABNORMAL HIGH (ref 70–99)
Glucose-Capillary: 237 mg/dL — ABNORMAL HIGH (ref 70–99)

## 2018-08-23 LAB — HEPARIN LEVEL (UNFRACTIONATED)
Heparin Unfractionated: 0.25 IU/mL — ABNORMAL LOW (ref 0.30–0.70)
Heparin Unfractionated: 0.31 IU/mL (ref 0.30–0.70)

## 2018-08-23 MED ORDER — DOCUSATE SODIUM 50 MG/5ML PO LIQD
100.0000 mg | Freq: Two times a day (BID) | ORAL | Status: DC | PRN
  Administered 2018-08-26: 100 mg via ORAL
  Filled 2018-08-23: qty 10

## 2018-08-23 NOTE — Progress Notes (Signed)
Pharmacist Heart Failure Core Measure Documentation  Assessment: Jose Myers has an EF documented as 25-30% on 08/20/18 by ECHO.  Rationale: Heart failure patients with left ventricular systolic dysfunction (LVSD) and an EF < 40% should be prescribed an angiotensin converting enzyme inhibitor (ACEI) or angiotensin receptor blocker (ARB) at discharge unless a contraindication is documented in the medical record.  This patient is not currently on an ACEI or ARB for HF.  This note is being placed in the record in order to provide documentation that a contraindication to the use of these agents is present for this encounter.  ACE Inhibitor or Angiotensin Receptor Blocker is contraindicated (specify all that apply)  []   ACEI allergy AND ARB allergy []   Angioedema []   Moderate or severe aortic stenosis []   Hyperkalemia []   Hypotension []   Renal artery stenosis [x]   Worsening renal function, preexisting renal disease or dysfunction   Jenetta DownerJessica Duquan Gillooly, Pharm D, BCPS, [facility] Clinical Pharmacist Phone [phone]  08/23/2018 4:56 PM

## 2018-08-23 NOTE — Progress Notes (Signed)
ANTICOAGULATION CONSULT NOTE   Pharmacy Consult:  Heparin Indication: chest pain/ACS/LV apical thrombus / acute CVA possible  No Known Allergies  Patient Measurements: Height: 5\' 9"  (175.3 cm) Weight: 205 lb 6.4 oz (93.2 kg)(scale a) IBW/kg (Calculated) : 70.7 Heparin Dosing Weight: 90 kg  Vital Signs: Temp: 99.6 F (37.6 C) (08/26 1600) Temp Source: Oral (08/26 1600) BP: 99/75 (08/26 1800) Pulse Rate: 87 (08/26 1800)  Labs: Recent Labs    08/20/18 2324  08/21/18 0340 08/21/18 0520 08/22/18 0337  08/22/18 2240 08/23/18 0506 08/23/18 0645 08/23/18 0853 08/23/18 2001  HGB  --    < > 13.4  --  12.2*  --   --  11.5*  --   --   --   HCT  --   --  41.3  --  38.9*  --   --  36.2*  --   --   --   PLT  --   --  242  --  225  --   --  182  --   --   --   HEPARINUNFRC  --   --  0.63  --  0.88*   < > 0.27*  --  0.25*  --  0.31  CREATININE  --   --   --  2.56* 3.47*  --   --   --   --  3.56*  --   TROPONINI 5.76*  --   --   --   --   --   --   --   --   --   --    < > = values in this interval not displayed.    Estimated Creatinine Clearance: 23.6 mL/min (A) (by C-G formula based on SCr of 3.56 mg/dL (H)).  Assessment: [age] YOM presents with 9 days of abdominal pain and increasing distension. Pt is s/p cath on 8/22 which showed multivessel CAD but not surgical candidate  - discussing PCI.  s/p  ECHO showed LV apical thrombus> started on heparin drip - follow up ECHO post VT arrest did not show thrombus but plan to continue anticoagulation. Code Stroke called 8/24. CT head negative for any acute findings. Small chronic infarction noted.  Heparin level is slightly subtherapeutic at 0.25 on 1350 units/hr. No bleeding reported by RN. Hgb 11.5  PM f/u>  Heparin level at goal.  Drawn peripherally since heparin infusing in central line.  No overt bleeding or complications noted.  Goal of Therapy:  Heparin level 0.3-0.5 units/ml Monitor platelets by anticoagulation protocol: Yes     Plan: ,  Continue IV heparin at current rate. Daily heparin level and CBC.  Jenetta DownerJessica Flynt Breeze, Pharm D, BCPS, [facility] Clinical Pharmacist Phone [phone]  08/23/2018 8:31 PM

## 2018-08-23 NOTE — Progress Notes (Signed)
Progress Note  Patient Name: Jose Myers Date of Encounter: 08/23/2018  Primary Cardiologist: NEw  Subjective   PT complaining of chest wall pain   WOrse with breath   No SOB    Inpatient Medications    Scheduled Meds: . aspirin EC  81 mg Oral Daily  . atorvastatin  40 mg Oral q1800  . glimepiride  4 mg Oral Q breakfast  . insulin aspart  0-9 Units Subcutaneous Q4H  . mouth rinse  15 mL Mouth Rinse BID  . omega-3 acid ethyl esters  1 g Oral QHS  . sertraline  25 mg Oral Daily  . sodium chloride flush  3 mL Intravenous Q12H   Continuous Infusions: . sodium chloride    . amiodarone 30 mg/hr (08/23/18 0800)  . furosemide Stopped (08/23/18 0618)  . heparin 1,350 Units/hr (08/23/18 0800)   PRN Meds: sodium chloride, acetaminophen **OR** acetaminophen, acetaminophen-codeine, docusate, fentaNYL, LORazepam, ondansetron (ZOFRAN) IV, RESOURCE THICKENUP CLEAR, sodium chloride flush, traZODone   Vital Signs    Vitals:   08/23/18 0500 08/23/18 0600 08/23/18 0700 08/23/18 0800  BP: 105/79 109/77 102/79 97/71  Pulse: 91 93 90 85  Resp: (!) 28 (!) 25 (!) 24 (!) 22  Temp:      TempSrc:      SpO2: 95% 97% 95% 97%  Weight:      Height:        Intake/Output Summary (Last 24 hours) at 08/23/2018 0805 Last data filed at 08/23/2018 0800 Gross per 24 hour  Intake 1195.09 ml  Output 1130 ml  Net 65.09 ml   Filed Weights   08/18/18 0431 08/19/18 0515 08/20/18 0459  Weight: 94 kg 93.8 kg 93.2 kg    Telemetry    SR   ECG     Physical Exam  Awake, alert GEN: No acute distress.   Neck: JVP is normal   Cardiac: RRR with occasional ectopy, no murmurs, rubs, or gallops. PRominent pop with inspiration   Respiratory: Clear to auscultation bilaterally.  Tender anterior right rib cage GI: Soft, nontender, non-distended  MS: No edema; No deformity. Neuro:  Nonfocal  Psych: Normal affect   Labs    Chemistry Recent Labs  Lab 08/20/18 1056 08/21/18 0340 08/21/18 0520  08/22/18 0337  NA 138  --  138 139  K 4.7  --  5.4* 4.2  CL 105  --  106 103  CO2 14*  --  21* 21*  GLUCOSE 374*  --  224* 251*  BUN 21  --  30* 41*  CREATININE 2.01*  --  2.56* 3.47*  CALCIUM 8.0*  --  7.8* 8.2*  PROT 5.5* SPECIMEN HEMOLYZED. HEMOLYSIS MAY AFFECT INTEGRITY OF RESULTS. 5.5*  --   ALBUMIN 2.8* SPECIMEN HEMOLYZED. HEMOLYSIS MAY AFFECT INTEGRITY OF RESULTS. 2.8*  --   AST 81* SPECIMEN HEMOLYZED. HEMOLYSIS MAY AFFECT INTEGRITY OF RESULTS. 91*  --   ALT 55* SPECIMEN HEMOLYZED. HEMOLYSIS MAY AFFECT INTEGRITY OF RESULTS. 63*  --   ALKPHOS 43 SPECIMEN HEMOLYZED. HEMOLYSIS MAY AFFECT INTEGRITY OF RESULTS. 41  --   BILITOT 1.5* SPECIMEN HEMOLYZED. HEMOLYSIS MAY AFFECT INTEGRITY OF RESULTS. 1.4*  --   GFRNONAA 33*  --  25* 17*  GFRAA 39*  --  29* 20*  ANIONGAP 19*  --  11 15     Hematology Recent Labs  Lab 08/21/18 0340 08/22/18 0337 08/23/18 0506  WBC 17.5* 17.2* 14.8*  RBC 4.70 4.34 4.04*  HGB 13.4 12.2* 11.5*  HCT  41.3 38.9* 36.2*  MCV 87.9 89.6 89.6  MCH 28.5 28.1 28.5  MCHC 32.4 31.4 31.8  RDW 14.1 14.8 14.7  PLT 242 225 182    Cardiac Enzymes Recent Labs  Lab 08/16/18 1004 08/20/18 1056 08/20/18 1716 08/20/18 2324  TROPONINI 1.95* 0.75* 1.74* 5.76*    No results for input(s): TROPIPOC in the last 168 hours.   BNP Recent Labs  Lab 08/20/18 1258  BNP 1,184.0*     DDimer No results for input(s): DDIMER in the last 168 hours.   Radiology    Dg Chest Port 1 View  Result Date: 08/22/2018 CLINICAL DATA:  Acute respiratory failure. EXAM: PORTABLE CHEST 1 VIEW COMPARISON:  Radiograph of August 21, 2018. FINDINGS: Stable cardiomegaly and central pulmonary vascular congestion is noted. Endotracheal nasogastric tubes have been removed. Left internal jugular catheter is noted with tip in expected position of the SVC. Mildly increased bilateral perihilar and basilar interstitial densities are noted concerning for pulmonary edema and associated pleural  effusions. No pneumothorax is noted. Bony thorax is unremarkable. IMPRESSION: Mildly increased bilateral perihilar and basilar interstitial densities are noted concerning for pulmonary edema and associated pleural effusions. Electronically Signed   By: Lupita RaiderJames  Green Jr, M.D.   On: 08/22/2018 12:15   Ct Head Code Stroke Wo Contrast  Result Date: 08/22/2018 CLINICAL DATA:  Code stroke. 62 y/o M; aphasia, vision loss, altered mental status, dysarthria. EXAM: CT HEAD WITHOUT CONTRAST TECHNIQUE: Contiguous axial images were obtained from the base of the skull through the vertex without intravenous contrast. COMPARISON:  None. FINDINGS: Brain: Hypodensity in the right inferior cerebellum may represent an acute or subacute infarction (series 5, image 53). No additional area of acute infarction, hemorrhage, or mass effect identified. No extra-axial collection, hydrocephalus, or herniation. Small chronic infarct within the left putamen extending into corona radiata and caudate body. Mild-to-moderate chronic microvascular ischemic changes and volume loss of the brain. Vascular: Calcific atherosclerosis of the carotid siphons. No hyperdense vessel identified. Skull: Normal. Negative for fracture or focal lesion. Sinuses/Orbits: No acute finding. Other: Chronic left lamina papyracea fracture with medial herniation of extraconal fat. ASPECTS [facility]([place] Stroke Program Early CT Score) - Ganglionic level infarction (caudate, lentiform nuclei, internal capsule, insula, M1-M3 cortex): 7 - Supraganglionic infarction (M4-M6 cortex): 3 Total score (0-10 with 10 being normal): 10 IMPRESSION: 1. Small hypodensity within the right inferior cerebellum which may represent an acute or subacute infarction. 2. No additional acute stroke, hemorrhage, or mass effect identified. 3. Mild-to-moderate chronic microvascular ischemic changes and volume loss of the brain for age. 4. Small chronic infarction within the left putamen extending into corona  radiata and caudate body. 5. ASPECTS is 10 These results were called by telephone at the time of interpretation on 08/22/2018 at 3:03 am to Dr. Amada JupiterKirkpatrick, who verbally acknowledged these results. Electronically Signed   By: Mitzi HansenLance  Furusawa-Stratton M.D.   On: 08/22/2018 03:06    Cardiac Studies   August 20, 2018 echo - Left ventricle: Systolic function was severely reduced. The estimated ejection fraction was in the range of 25% to 30%. Dyskinesis and scarring of the apical myocardium. Akinesis of the mid-apicalanteroseptal, anterior, and anterolateral myocardium; consistent with infarction in the distribution of the left anterior descending coronary artery; unchanged from the study of 08/18/2018. Doppler parameters are consistent with restrictive physiology, indicative of decreased left ventricular diastolic compliance and/or increased left atrial pressure. No evidence of thrombus. - Mitral valve: Calcified annulus. There was severe regurgitation directed centrally. The acceleration rate of  the regurgitant jet was reduced, consistent with a low dP/dt. Severe regurgitation is suggested by pulmonary vein systolic flow reversal. - Left atrium: The atrium was severely dilated. - Tricuspid valve: There was mild-moderate regurgitation directed centrally. - Pulmonary arteries: Systolic pressure was mildly increased. PA peak pressure: 43 mm Hg (S).  Impressions:  - Compared to the previous study images, the large LV apical thrombus is no longer present. Wall motion and EF are unchanged, but the mitral insufficiency appears worse.  TTE8/21/19 Study Conclusions  - Left ventricle: The cavity size was normal. Wall thickness was increased in a pattern of mild LVH. Systolic function was severely reduced. The estimated ejection fraction was in the range of 25% to 30%. Diffuse hypokinesis. There is akinesis of the anteroseptal, anterior, and  anterolateral myocardium. There was a large, 3.3 cm (L) x 1.8 cm (W), spherical, fixed, apical anteriorthrombusassociated with an akinetic segment. - Aortic valve: Trileaflet; mildly thickened, mildly calcified leaflets. - Mitral valve: There was moderate regurgitation. - Left atrium: The atrium was mildly dilated. - Tricuspid valve: There was mild regurgitation. - Pulmonary arteries: Systolic pressure was moderately increased. PA peak pressure: 59 mm Hg (S).  Cath 08/19/18  Severe multivessel CAD including left main. Distal left main 85%.  Diffusely diseased and totally occluded LAD in the mid segment. Proximal 80 to 99% obstruction. Diagonals are totally occluded.  80% ostial circumflex, 90% proximal first obtuse marginal, and total occlusion of the circumflex beyond the first marginal. Diffuse disease in the first marginal.  Dominant right coronary with segmental 70% distal stenosis, 85% distal RCA beyond the PDA but before the first large left ventricular branch. Diffuse disease in the proximal to midportion of the large right ventricular branch and the second posterolateral branch.  Left ventriculography and hemodynamic recordings were not performed because of LV apical thrombus.  Recommendations:   T CTS evaluation for surgical revascularization.  Guideline directed therapy for systolic heart failure: SGLT2 therapy for diabetes, resume ARB but preferably Entresto for decreased LV function, beta-blocker therapy as tolerated, and diuresis as needed for symptom control.  Monitor kidney function closely.  Anticoagulation for acute coronary syndrome and LV thrombus  Patient Profile     62 y.o. male with history of CAD and remote placement of an LAD stent 20 years ago, diabetes mellitus, presented with findings of acute biventricular heart failure and delayed presentation of STEMI.  Left ventricular apical thrombus present.  Cardiac catheterization showed extensive  multivessel CAD.  During work-up, developed monomorphic VT arrest with rapid resuscitation on August 22.  Successfully extubated August 24.  Developed acute kidney injury and urinary bladder retention.  Assessment & Plan    1. VT arrest:  Currently on IV amiodarone   No significant ectopy   WOuld continue for now   WIll need to contact EP re ICD  S 2. CHF:   LVEf 25 to 30%   Volume status is not bad   I would continue current meds for now   3. CAD:  Per TCTS evaluation, he is not a good candidate for bypass,  primarily due to very poor target vessels for grafting and high likelihood of perioperative myocardial infarction.    PT with severe CAD   Will review with interventional service   ? Viability study whne renal function improves 4. LV thrombus: Initial large thrombus is no longer seen and has probably embolized, but he is at high risk for redeveloping LV clot.  Will require anticoagulation for about 12 months,  probably with warfarin.  . 5. AKI:   Cr higher than yesterday   3.47   REnal service is following     For questions or updates, please contact [facility] Please consult [email or web address] for contact info under Cardiology/STEMI.      Signed, Dietrich Pates, MD  08/23/2018, 8:05 AM

## 2018-08-23 NOTE — Progress Notes (Signed)
Patient ID: Jose Myers, male   DOB: [birth year], 62 y.o.   MRN: [id number] [facility] Progress Note   Assessment/ Plan:   1.  Acute kidney injury: Based on the history, timeline of events and available database, suspected to be most likely from ischemic ATN following cardiac arrest with possible mild injury from iodinated intravenous contrast exposure for coronary angiogram.  Some improvement of urine output noted overnight however labs unavailable from this morning-we will order and follow-up on results of these to determine need for intervention/changes in therapy.  He does not appear to be uremic at this time and does not appear to have critical volume overload.  Continue intravenous furosemide 120 mg 3 times daily. 2.  Status post ventricular tachycardia arrest: Without acute events overnight, on intravenous amiodarone/heparin. 3.  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  This will be unfortunately complicated by his acute kidney injury. 4.  Ischemic cardiomyopathy with features of CHF exacerbation: Monitor with efforts at diuretic therapy and decide on need for intervention with hemodialysis based on response/labs.  Subjective:   Reports to be feeling fair and denies any chest pain, nausea, vomiting or dysgeusia.   Objective:   BP 97/71   Pulse 85   Temp 98.8 F (37.1 C) (Oral)   Resp (!) 22   Ht 5\' 9"  (1.753 m)   Wt 93.2 kg Comment: scale a  SpO2 97%   BMI 30.33 kg/m   Intake/Output Summary (Last 24 hours) at 08/23/2018 0809 Last data filed at 08/23/2018 0800 Gross per 24 hour  Intake 1195.09 ml  Output 1130 ml  Net 65.09 ml   Weight change:   Physical Exam: Gen: Comfortably sitting up in bed, high flow oxygen via nasal  cannula CVS: Pulse regular rhythm, normal rate, S1 and S2 normal Resp: Diminished breath sounds over bases without distinct rales or rhonchi Abd: Soft, obese, nontender Ext: No palpable lower extremity edema  Imaging: Dg Chest Port 1 View  Result Date: 08/22/2018 CLINICAL DATA:  Acute respiratory failure. EXAM: PORTABLE CHEST 1 VIEW COMPARISON:  Radiograph of August 21, 2018. FINDINGS: Stable cardiomegaly and central pulmonary vascular congestion is noted. Endotracheal nasogastric tubes have been removed. Left internal jugular catheter is noted with tip in expected position of the SVC. Mildly increased bilateral perihilar and basilar interstitial densities are noted concerning for pulmonary edema and associated pleural effusions. No pneumothorax is noted. Bony thorax is unremarkable. IMPRESSION: Mildly increased bilateral perihilar and basilar interstitial densities are noted concerning for pulmonary edema and associated pleural effusions. Electronically Signed   By: Lupita Raider, M.D.   On: 08/22/2018 12:15   Ct Head Code Stroke Wo Contrast  Result Date: 08/22/2018 CLINICAL DATA:  Code stroke. 62 y/o M; aphasia, vision loss, altered mental status, dysarthria. EXAM: CT HEAD WITHOUT CONTRAST TECHNIQUE: Contiguous axial images were obtained from the base of the skull through the vertex without intravenous contrast. COMPARISON:  None. FINDINGS: Brain: Hypodensity in the right inferior cerebellum may represent an acute or subacute infarction (series 5, image 53). No additional area of acute infarction, hemorrhage, or mass effect identified. No extra-axial collection, hydrocephalus, or herniation. Small chronic infarct within the left putamen extending into corona radiata and caudate body. Mild-to-moderate chronic microvascular ischemic changes and volume loss of the brain. Vascular: Calcific atherosclerosis  of the carotid siphons. No hyperdense vessel identified. Skull: Normal. Negative for fracture or  focal lesion. Sinuses/Orbits: No acute finding. Other: Chronic left lamina papyracea fracture with medial herniation of extraconal fat. ASPECTS [facility]([place] Stroke Program Early CT Score) - Ganglionic level infarction (caudate, lentiform nuclei, internal capsule, insula, M1-M3 cortex): 7 - Supraganglionic infarction (M4-M6 cortex): 3 Total score (0-10 with 10 being normal): 10 IMPRESSION: 1. Small hypodensity within the right inferior cerebellum which may represent an acute or subacute infarction. 2. No additional acute stroke, hemorrhage, or mass effect identified. 3. Mild-to-moderate chronic microvascular ischemic changes and volume loss of the brain for age. 4. 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 5. ASPECTS is 10 These results were called by telephone at the time of interpretation on 08/22/2018 at 3:03 am to Dr. Amada JupiterKirkpatrick, who verbally acknowledged these results. Electronically Signed   By: Mitzi HansenLance  Furusawa-Stratton M.D.   On: 08/22/2018 03:06    Labs: BMET Recent Labs  Lab 08/17/18 0431 08/18/18 0914 08/19/18 0442 08/20/18 0257 08/20/18 1056 08/20/18 1329 08/21/18 0520 08/22/18 0337  NA 137 138 139 137 138  --  138 139  K 3.5 3.7 3.5 3.6 4.7  --  5.4* 4.2  CL 106 104 106 106 105  --  106 103  CO2 24 22 23 23  14*  --  21* 21*  GLUCOSE 171* 245* 162* 236* 374*  --  224* 251*  BUN 28* 23 23 20 21   --  30* 41*  CREATININE 1.92* 1.78* 1.63* 1.61* 2.01*  --  2.56* 3.47*  CALCIUM 7.9* 8.4* 8.4* 8.4* 8.0*  --  7.8* 8.2*  PHOS  --   --   --   --   --  5.6* 5.1*  --    CBC Recent Labs  Lab 08/20/18 1056 08/21/18 0340 08/22/18 0337 08/23/18 0506  WBC 14.6* 17.5* 17.2* 14.8*  HGB 13.7 13.4 12.2* 11.5*  HCT 43.7 41.3 38.9* 36.2*  MCV 90.9 87.9 89.6 89.6  PLT 266 242 225 182    Medications:    . aspirin EC  81 mg Oral Daily  . atorvastatin  40 mg Oral q1800  . glimepiride  4 mg Oral Q breakfast  . insulin aspart  0-9 Units  Subcutaneous Q4H  . mouth rinse  15 mL Mouth Rinse BID  . omega-3 acid ethyl esters  1 g Oral QHS  . sertraline  25 mg Oral Daily  . sodium chloride flush  3 mL Intravenous Q12H    Zetta BillsJay Nayeli Calvert, MD 08/23/2018, 8:09 AM

## 2018-08-23 NOTE — Progress Notes (Signed)
*  Preliminary Results* Carotid artery duplex has been completed. Right internal carotid artery is 1-39%. Unable to visualize left ICA due to bandage and neck IV. Right vertebral artery is patent with antegrade flow.  08/23/2018 10:31 AM  Blanch MediaMegan Nannie Starzyk

## 2018-08-23 NOTE — Progress Notes (Signed)
STROKE TEAM PROGRESS NOTE   SUBJECTIVE (INTERVAL HISTORY) His wife is at the bedside.  Pt lying in bed without distress. CXR yesterday did not mention right rib fracture. MRI not done yet but will not think it could change management. Will d/c.    OBJECTIVE Vitals:   08/23/18 0700 08/23/18 0800 08/23/18 0900 08/23/18 1000  BP: 102/79 97/71  104/81  Pulse: 90 85 86 88  Resp: (!) 24 (!) 22 (!) 23 (!) 25  Temp:  97.9 F (36.6 C)    TempSrc:  Oral    SpO2: 95% 97% 95% 95%  Weight:      Height:        CBC:  Recent Labs  Lab 08/22/18 0337 08/23/18 0506  WBC 17.2* 14.8*  HGB 12.2* 11.5*  HCT 38.9* 36.2*  MCV 89.6 89.6  PLT 225 182    Basic Metabolic Panel:  Recent Labs  Lab 08/21/18 0340 08/21/18 0520 08/22/18 0337 08/23/18 0853  NA  --  138 139 139  K  --  5.4* 4.2 3.9  CL  --  106 103 102  CO2  --  21* 21* 26  GLUCOSE  --  224* 251* 228*  BUN  --  30* 41* 55*  CREATININE  --  2.56* 3.47* 3.56*  CALCIUM  --  7.8* 8.2* 8.4*  MG SPECIMEN HEMOLYZED. HEMOLYSIS MAY AFFECT INTEGRITY OF RESULTS. 2.5*  --   --   PHOS  --  5.1*  --  4.8*    Lipid Panel:     Component Value Date/Time   CHOL 111 08/22/2018 0337   TRIG 98 08/22/2018 0337   HDL 32 (L) 08/22/2018 0337   CHOLHDL 3.5 08/22/2018 0337   VLDL 20 08/22/2018 0337   LDLCALC 59 08/22/2018 0337   HgbA1c:  Lab Results  Component Value Date   HGBA1C 8.2 (H) 08/22/2018   Urine Drug Screen: No results found for: LABOPIA, COCAINSCRNUR, LABBENZ, AMPHETMU, THCU, LABBARB  Alcohol Level No results found for: ETH  IMAGING  Ct Head Code Stroke Wo Contrast 08/22/2018 IMPRESSION:  1. Small hypodensity within the right inferior cerebellum which may represent an acute or subacute infarction which is artifact on my reading since MRI brain no  Acute infarct done one and half hours prior to this CT.  2. No additional acute stroke, hemorrhage, or mass effect identified.  3. Mild-to-moderate chronic microvascular ischemic  changes and volume loss of the brain for age.  4. 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  5. ASPECTS is 10   MRI brain 08/22/18 limited sequence - 2:50am No acute infarct.    Dg Chest Port 1 View 08/21/2018 IMPRESSION:  Stable cardiomegaly with central pulmonary vascular congestion. Stable support apparatus. Stable bibasilar atelectasis or edema is noted with probable small pleural effusions.    Transthoracic Echocardiogram 08/20/18 - Compared to the previous study images, the large LV apical   thrombus is no longer present. Wall motion and EF are unchanged,   but the mitral insufficiency appears worse.  TTE 08/18/18 - Left ventricle: The cavity size was normal. Wall thickness was   increased in a pattern of mild LVH. Systolic function was   severely reduced. The estimated ejection fraction was in the   range of 25% to 30%. Diffuse hypokinesis. There is akinesis of   the anteroseptal, anterior, and anterolateral myocardium. There   was a large, 3.3 cm (L) x 1.8 cm (W), spherical, fixed, apical  anteriorthrombusassociated with an akinetic segment. - Aortic valve: Trileaflet; mildly thickened, mildly calcified   leaflets. - Mitral valve: There was moderate regurgitation. - Left atrium: The atrium was mildly dilated. - Tricuspid valve: There was mild regurgitation. - Pulmonary arteries: Systolic pressure was moderately increased.   PA peak pressure: 59 mm Hg (S).  Bilateral Carotid Dopplers - pending   PHYSICAL EXAM Temp:  [97.9 F (36.6 C)-98.8 F (37.1 C)] 97.9 F (36.6 C) (08/26 0800) Pulse Rate:  [85-96] 88 (08/26 1000) Resp:  [22-32] 25 (08/26 1000) BP: (97-115)/(70-89) 104/81 (08/26 1000) SpO2:  [92 %-98 %] 95 % (08/26 1000)  General - Well nourished, well developed, in no apparent distress, with face mask, complained of right rib pain.  Ophthalmologic - fundi not visualized due to  noncooperation.  Cardiovascular - Regular rhythm with no murmur, mild tachycardia.  Mental Status -  Level of arousal and orientation to time, place, and person were intact. Language including expression, naming, repetition, comprehension was assessed and found intact. Fund of Knowledge was assessed and was intact.  Cranial Nerves II - XII - II - Visual field intact OU. III, IV, VI - Extraocular movements intact. V - Facial sensation intact bilaterally. VII - Facial movement intact bilaterally. VIII - Hearing & vestibular intact bilaterally. X - Palate elevates symmetrically. XI - Chin turning & shoulder shrug intact bilaterally. XII - Tongue protrusion intact.  Motor Strength - The patient's strength was normal in all extremities and pronator drift was absent.  Bulk was normal and fasciculations were absent.   Motor Tone - Muscle tone was assessed at the neck and appendages and was normal.  Reflexes - The patient's reflexes were symmetrical in all extremities and he had no pathological reflexes.  Sensory - Light touch, temperature/pinprick were assessed and were symmetrical.    Coordination - The patient had normal movements in the hands and feet with no ataxia or dysmetria.  Tremor was absent.  Gait and Station - walked with PT/OT in the hallway with and without walker, minimal assistance, no gait or truncal ataxia.   ASSESSMENT/PLAN Mr. Jose Myers is a 62 y.o. male with history of HTN, DM, and CAD with recent STEMI / VT arrest presenting with AMS. He did not receive IV t-PA due to Heparin therapy for LV thrombus.  Encephalopathy vs. agitation vs. Sundowning vs. Delirium - improving  08/21/18 RN reported confusion and delirium, improved yesterday and today  Pt complained of angry on RN, right rib pain due to possible rib fracture with CPR, and LBP lying in bed  worsening AKI on CKD with Cr. 1.61->2.01->2.56->3.47->3.56  leukocytosis WBC 9.7->14.6->17.5->17.2->14.8,  improved  Tachycardia improved 130s->100s  Tachypnea overnight with increased RR -> improving - could be due to right rib pain  MS improved with no focal neuro deficit  CXR 08/22/18 no mentioning of right rib fracture  UA 08/15/18 no UTI  Correct metabolic derrangement as per primary team  Quiet time at night  Avoid sleep wake cycle disturbance  Pain management   Stroke ruled out - CT concerns of stroke likely artifact   CT head - Small hypodensity within the right inferior cerebellum which may represent an acute or subacute infarction. However, MRI brain one hour prior was negative for acute stroke, indicating the CT concern was artifact.  Small chronic infarction within the left putamen.  Carotid Doppler - right ICA unremarkable. Left ICA not insonated.   2D Echo 08/18/18 - EF 25-30%, large LV thrombus  2D echo  08/20/18 - EF 25-30%, LV thrombus resolved  LDL - 59  HgbA1c - 8.2  VTE prophylaxis - IV Heparin  Diet - NPO  No antithrombotic prior to admission, now on aspirin 81 mg daily and heparin IV. Continue heparin IV for stroke prevention and LV thrombus treatment. Transition to po once appropriate as per cardiology  Patient will be counseled to be compliant with his antithrombotic medications  Ongoing aggressive stroke risk factor management  Therapy recommendations:  pending  Disposition:  Pending  STEMI, LV thrombus, VT arrest s/p CPR  Management as per cardiology  CXR no mentioning of right rib fracture  On amiodarone  On ASA and lipitor  Hypertension  BP on the low side . BP goal normotensive  Hyperlipidemia  Lipid lowering medication PTA:  none  LDL 59, goal < 70  Current lipid lowering medication: Lipitor 40 mg daily  Continue statin at discharge  Diabetes  HgbA1c 8.2, goal < 7.0  Uncontrolled  SSI  CBG monitoring  PCP follow up for better DM management  Other Stroke Risk Factors  Advanced age  Obesity, Body mass index is  30.33 kg/m., recommend weight loss, diet and exercise as appropriate   Hx stroke/TIA by imaging - left putamen   Coronary artery disease with recent STEMI and VT arrest  Other Active Problems  Leukocytosis - 17.2->14.8  Creatinine - 2.56 -> 3.47->3.56 - nephrology on board  Hospital day # 8  Neurology will sign off. Please call with questions. No neuro outpt follow up needed at this time. Thanks for the consult.   Marvel PlanJindong Mariyah Upshaw, MD PhD Stroke Neurology 08/23/2018 10:39 AM    To contact Stroke Continuity provider, please refer to [email or web address]. After hours, contact General Neurology

## 2018-08-23 NOTE — Progress Notes (Signed)
OT Cancellation Note  Patient Details Name: Hayden Rasmussenommy L Avey MRN: [id number] DOB: December 31, 1955   Cancelled Treatment:    Reason Eval/Treat Not Completed: Patient at procedure or test/ unavailable(Will return as schedule allows.)  [facility] HospitalCharis M Eulice Rutledge Jedediah Noda MSOT, OTR/L Acute Rehab Pager: [phone] Office: [phone] 08/23/2018, 10:26 AM

## 2018-08-23 NOTE — Progress Notes (Signed)
  Speech Language Pathology Treatment: Dysphagia  Patient Details Name: Jose Myers MRN: [id number] DOB: 09/22/1956 Today's Date: 08/23/2018 Time: 7276-1848 SLP Time Calculation (min) (ACUTE ONLY): 9 min  Assessment / Plan / Recommendation Clinical Impression  Pt did not present with any overt s/s of aspiration or penetration with thin or regular texture po's. Eructation was observed after intake of both thin and regular. He reported perceived significant improvement in his swallowing today in comparison to yesterday with 1-2 coughs during breakfast today. SLP educated infrequent coughing is considered normal, but more consistent coughing during meals may indicate aspiration and would require f/u. Due to current presentation and pt report, recommend uprgrading diet to regular texture and thin liquids and continued ST not indicated.   HPI HPI: Pt is a 62 year old male admitted with non-STEMI s/p cardiac cath 8/21. PT had a VT arrest on 8/23 requiring CPR, epi x2, defibrillation x1. He was intubated 8/23-8/24. On the evening of 8/24 pt had new onset AMS, for which stroke w/u was pursued. CT Head showed questionable cerebellar hypodensity, although per neurology note, this is likely artifact. MRI pending. PMH: DM, HTN, CAD      SLP Plan  All goals met       Recommendations  Diet recommendations: Regular;Thin liquid Liquids provided via: Cup;Straw Medication Administration: Whole meds with liquid Supervision: Patient able to self feed Compensations: Slow rate;Small sips/bites;Follow solids with liquid Postural Changes and/or Swallow Maneuvers: Upright 30-60 min after meal                Oral Care Recommendations: Oral care BID Follow up Recommendations: None SLP Visit Diagnosis: Dysphagia, unspecified (R13.10) Plan: All goals met       Jettie Booze, Student SLP                 Jettie Booze 08/23/2018, 10:53 AM

## 2018-08-23 NOTE — Evaluation (Addendum)
Occupational Therapy Evaluation Patient Details Name: Jose Rasmussenommy L Rengel MRN: [id number] DOB: 1956-05-10 Today's Date: 08/23/2018    History of Present Illness 62 y.o. male admitted 08/15/18 with NSTEMI, presenting with systolic CHF and acute renal insufficiency.  EF 25-30% with LV thrombus, not a surgical candidate. On 8/23, he went into v-tach arrest, CPR, epi x2,  defibrillated x1. Code stroke called on 8/24 for speech difficulty and confusion, MRI negative, CT showed possible cerebellum infarct, which neurology believes may be artifact.    Clinical Impression   PTA, pt was living with his wife and was independent. Pt currently performing ADL[facility] and [facility]. Pt presenting with decreased balance, activity tolerance, and safety awareness. Pt with decreased motivation to participate in therapy stating "I can do anything now that I could before coming here." Pt would benefit from further acute OT to facilitate safe dc. Recommend dc to home once medically stable per physician.    Follow Up Recommendations  No OT follow up;Supervision/Assistance - 24 hour    Equipment Recommendations  None recommended by OT    Recommendations for Other Services PT consult     Precautions / Restrictions Precautions Precautions: Fall Restrictions Weight Bearing Restrictions: No      Mobility Bed Mobility               General bed mobility comments: Pt wanting to sit at EOB. RN present  Transfers Overall transfer level: Needs assistance Equipment used: 1 person hand held assist Transfers: Sit to/from [employer]Stand;Stand Pivot Transfers Sit to Stand: Min guard Stand pivot transfers: Min guard       General transfer comment: Min Guard A for safety    Balance Overall balance assessment: Needs assistance Sitting-balance support: No upper extremity supported;Feet supported Sitting balance-Leahy Scale: Good       Standing balance-Leahy Scale: Good                              ADL either performed or assessed with clinical judgement   ADL Overall ADL's : Needs assistance/impaired Eating/Feeding: Sitting;Set up   Grooming: Set up;Sitting   Upper Body Bathing: Supervision/ safety;Set up;Sitting   Lower Body Bathing: Min guard;Sit to/from stand   Upper Body Dressing : Set up;Supervision/safety;Sitting   Lower Body Dressing: Sit to/from stand;Min guard   Toilet Transfer: Min guard;Stand-pivot;BSC           Functional mobility during ADLs: Min guard(stand pivot only) General ADL Comments: Pt performing ADLs at [employer] level. Upon arrival, [facility] Guard A for safety. Pt declining any other ADLs     Vision Baseline Vision/History: Wears glasses Wears Glasses: Reading only Patient Visual Report: No change from baseline Additional Comments: Pt denies any vision deficits and declines any assessment/screening     Perception     Praxis      Pertinent Vitals/Pain Pain Assessment: Faces Faces Pain Scale: Hurts a little bit Pain Location: chest Pain Descriptors / Indicators: Sore Pain Intervention(s): Monitored during session     Hand Dominance Right (Pt reporting he is ambidextrous)   Extremity/Trunk Assessment Upper Extremity Assessment Upper Extremity Assessment: Overall WFL for tasks assessed   Lower Extremity Assessment Lower Extremity Assessment: Overall WFL for tasks assessed   Cervical / Trunk Assessment Cervical / Trunk Assessment: Normal   Communication Communication Communication: No difficulties   Cognition Arousal/Alertness: Awake/alert Behavior During Therapy: WFL for tasks assessed/performed Overall Cognitive Status:  Impaired/Different from baseline Area of Impairment: Attention;Following commands;Safety/judgement;Awareness;Problem solving                   Current Attention Level: Selective   Following Commands: Follows multi-step commands with increased time Safety/Judgement:  Decreased awareness of safety Awareness: Emergent Problem Solving: Requires verbal cues General Comments: Pt with decreased safety and awarness. Pt declining any ADLs and presenting with self limiting behaviors.    General Comments  VSS throughout    Exercises     Shoulder Instructions      Home Living Family/patient expects to be discharged to:: Private residence Living Arrangements: Spouse/significant other Available Help at Discharge: Family;Available 24 hours/day Type of Home: House Home Access: Stairs to enter [employer] of Steps: 3   Home Layout: One level     Bathroom Shower/Tub: Chief Strategy Officer: Standard     Home Equipment: Cane - single point          Prior Functioning/Environment Level of Independence: Independent        Comments: ADLs,  IADLs, driving        OT Problem List: Decreased strength;Decreased range of motion;Decreased activity tolerance;Impaired balance (sitting and/or standing);Decreased cognition;Decreased safety awareness;Decreased knowledge of use of DME or AE;Decreased knowledge of precautions;Pain      OT Treatment/Interventions: Self-care/ADL training;Therapeutic exercise;Energy conservation;DME and/or AE instruction;Therapeutic activities;Patient/family education    OT Goals(Current goals can be found in the care plan section) Acute Rehab OT Goals Patient Stated Goal: get some rest  OT Goal Formulation: With patient Time For Goal Achievement: 09/06/18 Potential to Achieve Goals: Good ADL Goals Pt Will Perform Grooming: with modified independence;standing Pt Will Perform Lower Body Dressing: with modified independence;sit to/from stand Pt Will Transfer to Toilet: with modified independence;ambulating;regular height toilet Pt Will Perform Toileting - Clothing Manipulation and hygiene: with modified independence;sit to/from stand  OT Frequency: Min 2X/week   Barriers to D/C:             Co-evaluation              AM-PAC PT "6 Clicks" Daily Activity     Outcome Measure Help from another person eating meals?: None Help from another person taking care of personal grooming?: None Help from another person toileting, which includes using toliet, bedpan, or urinal?: A Little Help from another person bathing (including washing, rinsing, drying)?: A Little Help from another person to put on and taking off regular upper body clothing?: None Help from another person to put on and taking off regular lower body clothing?: A Little 6 Click Score: 21   End of Session Equipment Utilized During Treatment: Oxygen(6L) Nurse Communication: Mobility status  Activity Tolerance: Patient tolerated treatment well Patient left: in bed;with call bell/phone within reach;with nursing/sitter in room  OT Visit Diagnosis: Unsteadiness on feet (R26.81);Other abnormalities of gait and mobility (R26.89);Muscle weakness (generalized) (M62.81);Other symptoms and signs involving cognitive function                Time: 8295-6213 OT Time Calculation (min): 15 min Charges:  OT General Charges $OT Visit: 1 Visit OT Evaluation $OT Eval Moderate Complexity: 1 Mod  Aidyn Sportsman MSOT, OTR/L Acute Rehab Pager: [phone] Office: [phone]  Theodoro Grist Olanda Downie 08/23/2018, 4:03 PM

## 2018-08-23 NOTE — Progress Notes (Signed)
Inpatient Diabetes Program Recommendations  AACE/ADA: New Consensus Statement on Inpatient Glycemic Control (2015)  Target Ranges:  Prepandial:   less than 140 mg/dL      Peak postprandial:   less than 180 mg/dL (1-2 hours)      Critically ill patients:  140 - 180 mg/dL   Lab Results  Component Value Date   GLUCAP 201 (H) 08/23/2018   HGBA1C 8.2 (H) 08/22/2018    Review of Glycemic Control Results for Jose RasmussenSCONTRIAS, Jose L (MRN [id number]) as of 08/23/2018 13:14  Ref. Range 08/22/2018 19:50 08/22/2018 23:13 08/23/2018 03:08 08/23/2018 08:09 08/23/2018 12:01  Glucose-Capillary Latest Ref Range: 70 - 99 mg/dL 409226 (H) 811216 (H) 914175 (H) 202 (H) 201 (H)   Diabetes history: Type 2 DM  Outpatient Diabetes medications: Amaryl 4 mg bid, Metformin 500 mg tid with meals  Current orders for Inpatient glycemic control:  Novolog sensitive q 4 hours, Amaryl 4 mg bid Inpatient Diabetes Program Recommendations:   May consider adding Lantus 10 units daily.  Consider changing Novolog correction to tid with meals and HS.    Thanks,  Jose MeagerJenny Conleigh Heinlein, RN, BC-ADM Inpatient Diabetes Coordinator Pager [phone] (8a-5p)

## 2018-08-23 NOTE — Plan of Care (Signed)
Patient is resistant to participating in his own care. He has refused to work with some of the services and has cut short other interactions. The patient has refused to eat even after a new speech evaluation was completed and he was cleared for a regular diet. He is adamant about wanting to go home as soon as possible but does not seem to be willing to put in the work its going to take to get there. Multiple attempts have been made to explain the need for assistance from the other hospital services as well as the importance of his participation.

## 2018-08-23 NOTE — Progress Notes (Signed)
PT Cancellation Note  Patient Details Name: Hayden Rasmussenommy L Espejo MRN: [id number] DOB: Mar 05, 1956   Cancelled Treatment:    Reason Eval/Treat Not Completed: Patient at procedure or test/unavailable   Auther Lyerly B Jonae Renshaw 08/23/2018, 10:15 AM Delaney MeigsMaija Tabor Beretta Ginsberg, PT [phone]

## 2018-08-23 NOTE — Progress Notes (Signed)
Physical Therapy Treatment Patient Details Name: Jose Rasmussenommy L Fawver MRN: [id number] DOB: 12/16/1956 Today's Date: 08/23/2018    History of Present Illness 62 y.o. male admitted 08/15/18 with NSTEMI, presenting with systolic CHF and acute renal insufficiency.  EF 25-30% with LV thrombus, not a surgical candidate. On 8/23, he went into v-tach arrest, CPR, epi x2,  defibrillated x1. Code stroke called on 8/24 for speech difficulty and confusion, MRI negative, CT showed possible cerebellum infarct, which neurology believes may be artifact, repeat MRI ordered at time of PT eval and is pending 8/25.     PT Comments    Pt visiting in bed w/ wife on arrival, agreeable to therapy with encouragement. Pt with increased balance during ambulation w/ RW, felt "too tired" to try without RW today. Able to progress 24650ft w/ min guard +1. Pt vitals on arrival w/ HOB elevated: HR 90, SpO2 on 10L hfnc 94%, RR 24, BP 101/77 (85). VSS throughout session.    Follow Up Recommendations  Outpatient PT;Supervision for mobility/OOB     Equipment Recommendations  (TBD )    Recommendations for Other Services OT consult     Precautions / Restrictions Precautions Precautions: Fall Restrictions Weight Bearing Restrictions: No    Mobility  Bed Mobility Overal bed mobility: Needs Assistance Bed Mobility: Supine to Sit     Supine to sit: Supervision     General bed mobility comments: use of rail on L, supervision for lines   Transfers Overall transfer level: Needs assistance Equipment used: Rolling walker (2 wheeled) Transfers: Sit to/from Stand Sit to Stand: Min guard         General transfer comment: min guard for safety, verbal cues for one hand placement on bed vs walker   Ambulation/Gait Ambulation/Gait assistance: Min guard Gait Distance (Feet): 250 Feet Assistive device: Rolling walker (2 wheeled) Gait Pattern/deviations: Step-through pattern;Decreased stride length Gait velocity: decreased   Gait velocity interpretation: 1.31 - 2.62 ft/sec, indicative of limited community ambulator General Gait Details: Pt walking with RW, did not feel comfortable trying without today. Balance improved with ability to accept challenge with weight shift "dancing" and kicking.    Stairs             Wheelchair Mobility    Modified Rankin (Stroke Patients Only)       Balance Overall balance assessment: Needs assistance   Sitting balance-Leahy Scale: Good Sitting balance - Comments: assist for line safety      Standing balance-Leahy Scale: Good Standing balance comment: able to shift weight without support                             Cognition Arousal/Alertness: Awake/alert Behavior During Therapy: WFL for tasks assessed/performed Overall Cognitive Status: Within Functional Limits for tasks assessed                                        Exercises General Exercises - Lower Extremity Long Arc Quad: AROM;15 reps;Both;Seated Heel Slides: 5 reps;Both;Seated;AROM Hip Flexion/Marching: Both;Seated;AROM;15 reps    General Comments        Pertinent Vitals/Pain Pain Assessment: Faces Pain Score: 9  Faces Pain Scale: Hurts a little bit Pain Location: chest Pain Descriptors / Indicators: Sore Pain Intervention(s): Limited activity within patient's tolerance;Repositioned;Monitored during session;Patient requesting pain meds-RN notified    Home Living  Prior Function            PT Goals (current goals can now be found in the care plan section) Progress towards PT goals: Progressing toward goals    Frequency    Min 3X/week      PT Plan Current plan remains appropriate    Co-evaluation              AM-PAC PT "6 Clicks" Daily Activity  Outcome Measure  Difficulty turning over in bed (including adjusting bedclothes, sheets and blankets)?: A Little Difficulty moving from lying on back to sitting on  the side of the bed? : A Little Difficulty sitting down on and standing up from a chair with arms (e.g., wheelchair, bedside commode, etc,.)?: A Little Help needed moving to and from a bed to chair (including a wheelchair)?: A Little Help needed walking in hospital room?: A Little Help needed climbing 3-5 steps with a railing? : A Little 6 Click Score: 18    End of Session Equipment Utilized During Treatment: Gait belt;Oxygen Activity Tolerance: Patient tolerated treatment well;Patient limited by fatigue Patient left: in chair;with chair alarm set;with call bell/phone within reach;with family/visitor present Nurse Communication: Mobility status PT Visit Diagnosis: Unsteadiness on feet (R26.81)     Time: 0981-1914 PT Time Calculation (min) (ACUTE ONLY): 30 min  Charges:  $Gait Training: 8-22 mins $Therapeutic Activity: 8-22 mins                     Carma Lair, [place]  [facility] [phone]    Carma Lair 08/23/2018, 12:15 PM

## 2018-08-23 NOTE — Progress Notes (Addendum)
PULMONARY / CRITICAL CARE MEDICINE   Name: Jose Myers MRN: [id number] DOB: Jan 26, 1956    ADMISSION DATE:  08/15/2018 CONSULTATION DATE:  08/20/2018  REFERRING MD:  B. Christopher  CHIEF COMPLAINT:  V-fib arrest  HISTORY OF PRESENT ILLNESS:   This is a 62 year old maleadmitted 08/15/2018 with history of diabetes and HTNwho ruled in for non-STEMI, presenting with systolic congestive heart failure and acute renal insufficiency who had been medically stabilized. His cath 8/21 revealedsevere three-vessel coronary artery disease and ejection fraction on echocardiogram is impaired at 25 to 30%and an LV thrombus.Heparin was started.He appearedto be a suitable candidate to proceed with coronary artery surgical revascularization, which was scheduled for 08/23/2018.He went into v-fib/ v-tach arrest on 08/20/2018 .He was resuscitated with CPR, he received epi x 2 and was defibrillated x 1. Total code time was 16 minutes with 6 minutes of ROSC in between. He was given Mag 2 grams, and bolused with amiodarone and started on gtt.PCCM was asked to consult for acute respiratory failure and medical management. He did well over the night and was extubated 8/24.  On the PM of 8/24 the patient developed some "speech difficulty," for which an MRI was scheduled. While at MRI the patient became restless, tachypnic and c/o of SOB while lying flat.  MRI was not able to be performed and the patient was transported back to [facility]. Around 0200 the patient appeared to be confused and tried to put his nasal cannula in his mouth, and began to have slurred speech and arm drifts. Code Stroke was activated and a stat head CT showed a small hypodensity within the right inferior cerebellum that may represent an acute or subacute infarction. Of note, that patient had been of IV heparin since 8/22 for an LV mural thrombus.  SUBJECTIVE:  No complaints from patient other than right rib pain Denies SOB Currently on  HFNC  VITAL SIGNS: BP 102/79   Pulse 90   Temp 98.8 F (37.1 C) (Oral)   Resp (!) 24   Ht 5\' 9"  (1.753 m)   Wt 93.2 kg Comment: scale a  SpO2 95%   BMI 30.33 kg/m   HEMODYNAMICS: CVP:  [8 mmHg-16 mmHg] 11 mmHg  VENTILATOR SETTINGS: FiO2 (%):  [45 %] 45 %  INTAKE / OUTPUT: I/O last 3 completed shifts: In: 1701.5 [P.O.:360; I.V.:1109.2; IV Piggyback:232.3] Out: 1130 [Urine:1130] UOP 180 cc in past 24 hr. Received Lasix 80 mg at 0600 with no output by 09:00  PHYSICAL EXAMINATION: General:  Well nourished adult male sitting in bed in NAD HEENT: MM pink/moist, pupils 3/ reactive Neuro: alert oriented, MAE, no drift, speech clear CV: rrr, no murmur PULM: even/non-labored on HFNC ~12-15L, lungs bilaterally clear anteriorly, diminished in bases with faint bibasilar rales GI: soft, non-tender, bs hypoactive  Extremities: warm/dry, no edema  Skin: no rashes   LABS:  BMET Recent Labs  Lab 08/20/18 1056 08/21/18 0520 08/22/18 0337  NA 138 138 139  K 4.7 5.4* 4.2  CL 105 106 103  CO2 14* 21* 21*  BUN 21 30* 41*  CREATININE 2.01* 2.56* 3.47*  GLUCOSE 374* 224* 251*    Electrolytes Recent Labs  Lab 08/20/18 1056 08/20/18 1329 08/21/18 0340 08/21/18 0520 08/22/18 0337  CALCIUM 8.0*  --   --  7.8* 8.2*  MG  --  2.6* SPECIMEN HEMOLYZED. HEMOLYSIS MAY AFFECT INTEGRITY OF RESULTS. 2.5*  --   PHOS  --  5.6*  --  5.1*  --     CBC  Recent Labs  Lab 08/21/18 0340 08/22/18 0337 08/23/18 0506  WBC 17.5* 17.2* 14.8*  HGB 13.4 12.2* 11.5*  HCT 41.3 38.9* 36.2*  PLT 242 225 182    Coag's No results for input(s): APTT, INR in the last 168 hours.  Sepsis Markers Recent Labs  Lab 08/20/18 1056 08/20/18 1329 08/20/18 1716  LATICACIDVEN 8.6* 2.9* 2.1*  PROCALCITON  --  <0.10  --     ABG Recent Labs  Lab 08/21/18 0439 08/22/18 0318 08/22/18 0822  PHART 7.366 7.328* 7.379  PCO2ART 34.0 33.6 33.1  PO2ART 113.0* 58.0* 65.0*    Liver Enzymes Recent Labs   Lab 08/20/18 1056 08/21/18 0340 08/21/18 0520  AST 81* SPECIMEN HEMOLYZED. HEMOLYSIS MAY AFFECT INTEGRITY OF RESULTS. 91*  ALT 55* SPECIMEN HEMOLYZED. HEMOLYSIS MAY AFFECT INTEGRITY OF RESULTS. 63*  ALKPHOS 43 SPECIMEN HEMOLYZED. HEMOLYSIS MAY AFFECT INTEGRITY OF RESULTS. 41  BILITOT 1.5* SPECIMEN HEMOLYZED. HEMOLYSIS MAY AFFECT INTEGRITY OF RESULTS. 1.4*  ALBUMIN 2.8* SPECIMEN HEMOLYZED. HEMOLYSIS MAY AFFECT INTEGRITY OF RESULTS. 2.8*    Cardiac Enzymes Recent Labs  Lab 08/20/18 1056 08/20/18 1716 08/20/18 2324  TROPONINI 0.75* 1.74* 5.76*    Glucose Recent Labs  Lab 08/22/18 0920 08/22/18 1136 08/22/18 1552 08/22/18 1950 08/22/18 2313 08/23/18 0308  GLUCAP 190* 246* 216* 226* 216* 175*    Imaging Dg Chest Port 1 View  Result Date: 08/22/2018 CLINICAL DATA:  Acute respiratory failure. EXAM: PORTABLE CHEST 1 VIEW COMPARISON:  Radiograph of August 21, 2018. FINDINGS: Stable cardiomegaly and central pulmonary vascular congestion is noted. Endotracheal nasogastric tubes have been removed. Left internal jugular catheter is noted with tip in expected position of the SVC. Mildly increased bilateral perihilar and basilar interstitial densities are noted concerning for pulmonary edema and associated pleural effusions. No pneumothorax is noted. Bony thorax is unremarkable. IMPRESSION: Mildly increased bilateral perihilar and basilar interstitial densities are noted concerning for pulmonary edema and associated pleural effusions. Electronically Signed   By: Lupita RaiderJames  Green Jr, M.D.   On: 08/22/2018 12:15   STUDIES:  Head CT as above  SIGNIFICANT EVENTS: 8/18 Admit 8/21 RHC 8/23 cardiac arrest 8/24 extubated; PM developed some "speech difficulty,", unable to complete MRI as pt became restless, tachypnic and c/o of SOB while lying flat. Around 0200 the patient appeared to be confused and tried to put his nasal cannula in his mouth, and began to have slurred speech and arm drifts. Code  Stroke was activated and a stat head CT showed a small hypodensity within the right inferior cerebellum that may represent an acute or subacute infarction. Of note, that patient had been of IV heparin since 8/22 for an LV mural thrombus   LINES/TUBES: L IJ TLC  DISCUSSION: 701) 62 year old maleadmitted with history of diabetes and HTNwho ruled in for non-STEMI, presenting with systolic congestive heart failure and acute renal insufficiency. His cath 8/21 revealedsevere three-vessel coronary artery disease and ejection fraction on echocardiogram is impaired at 25 to 30%and an LV thrombus, for which heparin was started.He appearedto be a suitable candidate to proceed with coronary artery surgical revascularization, which was scheduled for 08/23/2018.He had a v-fib/ v-tach arrest on 08/20/2018, was successfully resuscitated; with CPR and received epi x 2 and was defibrillated x 1. His post-arrest course was uneventful. 2) On the evening 8/24-8/25 developed an acute or subacute cerebellar infarction while on heparin.  3) UOP has declined with rising creatinine/ bladder retention  ASSESSMENT / PLAN:  PULMONARY A: Acute respiratory failure in the setting of cardiac arrest  Pulmonary edema +/- infectious process R rib pain s/p CPR - currently on HFNC  - s/p SLP 8/25- mild to moderate aspiration risk  P:   Wean O2 for sats > 94% Aggressive pulmonary hygiene with IS/ mobilize/ PT/ OT Aggressive diuresis as below as able See ID  Diet per SLP- dysphagia 2; nectar thick   CARDIOVASCULAR A:  CAD, CHF with EF 25-30%, s/p VT arrest Intermittent nonsustained VT LV thrombus P:  Monitor  Cards primary amio per cards Heparin per phamacy Planning ICD per EP Cards considering possible cardiac MRI vs high risk  PCI, once renal function recovered, since patient is not a candidate now for CABG  RENAL A:   AKI- worsening, in the setting of cardiac arrest in addition to possible contrast  nephrotoxicity from [facility] 8/22 and post renal obstruction 2/2 bladder retention - FeNa suggestive of prerenal 8/25 - no bmet 8/26 - UOP 0.4-0.6 ml/kg/hr P:   Nephrology managing  Lasix 120 mg q 8 hr  HEMATOLOGIC A:   Mild [place] anemia likely related to acute illness - Hgb stable  P:  Trend   INFECTIOUS A:   Leukocytosis- improving  - afebrile - neg PCT 8/23 P:   Monitor clinically Follow cultures  ENDOCRINE A:   DM - ongoing hyperglycemia P:   SSI/ CBG q 4 On home amaryl  NEUROLOGIC A:   Acute or subacute cerebellar infarction while on heparin. Possibly secondary to previous LV thrombus. P:   Neurology following  IV heparin; will likely need long-term anticoag.  Family: No family at bedside.  Family updated on plan of care.  Nothing further to add as patient is hemodynamically stable and weaning FiO2.  PCCM will sign off.  Please do not hesitate to call us back if we can be of any further assistance.  Posey Boyer, AGACNP-BC [facility] Pgr: [phone] or if no answer [phone] 08/23/2018, 8:29 AM

## 2018-08-23 NOTE — Progress Notes (Signed)
ANTICOAGULATION CONSULT NOTE   Pharmacy Consult:  Heparin Indication: chest pain/ACS/LV apical thrombus / acute CVA possible  No Known Allergies  Patient Measurements: Height: 5\' 9"  (175.3 cm) Weight: 205 lb 6.4 oz (93.2 kg)(scale a) IBW/kg (Calculated) : 70.7 Heparin Dosing Weight: 90 kg  Vital Signs: Temp: 97.9 F (36.6 C) (08/26 0800) Temp Source: Oral (08/26 0800) BP: 97/71 (08/26 0800) Pulse Rate: 85 (08/26 0800)  Labs: Recent Labs    08/20/18 1056 08/20/18 1716  08/20/18 2324 08/21/18 0340 08/21/18 0520 08/22/18 0337 08/22/18 1354 08/22/18 2240 08/23/18 0506 08/23/18 0645  HGB 13.7  --   --   --  13.4  --  12.2*  --   --  11.5*  --   HCT 43.7  --   --   --  41.3  --  38.9*  --   --  36.2*  --   PLT 266  --   --   --  242  --  225  --   --  182  --   HEPARINUNFRC  --   --    < >  --  0.63  --  0.88* 0.70 0.27*  --  0.25*  CREATININE 2.01*  --   --   --   --  2.56* 3.47*  --   --   --   --   TROPONINI 0.75* 1.74*  --  5.76*  --   --   --   --   --   --   --    < > = values in this interval not displayed.    Estimated Creatinine Clearance: 24.2 mL/min (A) (by C-G formula based on SCr of 3.47 mg/dL (H)).  Assessment: [age] YOM presents with 9 days of abdominal pain and increasing distension. Pt is s/p cath on 8/22 which showed multivessel CAD but not surgical candidate  - discussing PCI.  s/p  ECHO showed LV apical thrombus> started on heparin drip - follow up ECHO post VT arrest did not show thrombus but plan to continue anticoagulation. Code Stroke called 8/24. CT head negative for any acute findings. Small chronic infarction noted.  Heparin level is slightly subtherapeutic at 0.25 on 1350 units/hr. No bleeding reported by RN. Hgb 11.5  Goal of Therapy:  Heparin level 0.3-0.5 units/ml Monitor platelets by anticoagulation protocol: Yes    Plan: ,  Increase IV heparin to 1450 units/hr  Check heparin level in 6 hrs  Avoid boluses Daily heparin level and  CBC Continue to monitor H&H and platelets  Danae OrleansMegan McCarthy, PharmD PGY1 Pharmacy Resident Phone [phone] 08/23/2018       9:03 AM

## 2018-08-24 LAB — RENAL FUNCTION PANEL
ANION GAP: 14 (ref 5–15)
Albumin: 2.7 g/dL — ABNORMAL LOW (ref 3.5–5.0)
BUN: 60 mg/dL — ABNORMAL HIGH (ref 8–23)
CHLORIDE: 101 mmol/L (ref 98–111)
CO2: 24 mmol/L (ref 22–32)
Calcium: 8.2 mg/dL — ABNORMAL LOW (ref 8.9–10.3)
Creatinine, Ser: 3.38 mg/dL — ABNORMAL HIGH (ref 0.61–1.24)
GFR calc Af Amer: 21 mL/min — ABNORMAL LOW (ref >60)
GFR, EST NON AFRICAN AMERICAN: 18 mL/min — AB (ref >60)
GLUCOSE: 157 mg/dL — AB (ref 70–99)
PHOSPHORUS: 4.7 mg/dL — AB (ref 2.5–4.6)
Potassium: 3.6 mmol/L (ref 3.5–5.1)
Sodium: 139 mmol/L (ref 135–145)

## 2018-08-24 LAB — GLUCOSE, CAPILLARY
GLUCOSE-CAPILLARY: 173 mg/dL — AB (ref 70–99)
GLUCOSE-CAPILLARY: 188 mg/dL — AB (ref 70–99)
GLUCOSE-CAPILLARY: 203 mg/dL — AB (ref 70–99)
Glucose-Capillary: 147 mg/dL — ABNORMAL HIGH (ref 70–99)
Glucose-Capillary: 199 mg/dL — ABNORMAL HIGH (ref 70–99)
Glucose-Capillary: 160 mg/dL — ABNORMAL HIGH (ref 70–99)

## 2018-08-24 LAB — CBC
HEMATOCRIT: 35.8 % — AB (ref 39.0–52.0)
Hemoglobin: 11.3 g/dL — ABNORMAL LOW (ref 13.0–17.0)
MCH: 28.1 pg (ref 26.0–34.0)
MCHC: 31.6 g/dL (ref 30.0–36.0)
MCV: 89.1 fL (ref 78.0–100.0)
Platelets: 180 10*3/uL (ref 150–400)
RBC: 4.02 MIL/uL — ABNORMAL LOW (ref 4.22–5.81)
RDW: 14.6 % (ref 11.5–15.5)
WBC: 7.3 10*3/uL (ref 4.0–10.5)

## 2018-08-24 LAB — HEPARIN LEVEL (UNFRACTIONATED): Heparin Unfractionated: 0.32 IU/mL (ref 0.30–0.70)

## 2018-08-24 MED ORDER — WARFARIN SODIUM 7.5 MG PO TABS
7.5000 mg | ORAL_TABLET | Freq: Once | ORAL | Status: AC
  Administered 2018-08-24: 7.5 mg via ORAL
  Filled 2018-08-24: qty 1

## 2018-08-24 MED ORDER — INSULIN ASPART 100 UNIT/ML ~~LOC~~ SOLN
0.0000 [IU] | Freq: Three times a day (TID) | SUBCUTANEOUS | Status: DC
  Administered 2018-08-24: 3 [IU] via SUBCUTANEOUS
  Administered 2018-08-24: 5 [IU] via SUBCUTANEOUS
  Administered 2018-08-25 (×2): 3 [IU] via SUBCUTANEOUS
  Administered 2018-08-25: 5 [IU] via SUBCUTANEOUS
  Administered 2018-08-26 (×2): 2 [IU] via SUBCUTANEOUS
  Administered 2018-08-26: 3 [IU] via SUBCUTANEOUS
  Administered 2018-08-27: 5 [IU] via SUBCUTANEOUS
  Administered 2018-08-27: 3 [IU] via SUBCUTANEOUS
  Administered 2018-08-28 (×2): 5 [IU] via SUBCUTANEOUS
  Administered 2018-08-28: 3 [IU] via SUBCUTANEOUS
  Administered 2018-08-29: 5 [IU] via SUBCUTANEOUS
  Administered 2018-08-29: 8 [IU] via SUBCUTANEOUS
  Administered 2018-08-29: 3 [IU] via SUBCUTANEOUS
  Administered 2018-08-30: 8 [IU] via SUBCUTANEOUS
  Administered 2018-08-30: 5 [IU] via SUBCUTANEOUS
  Administered 2018-08-30: 2 [IU] via SUBCUTANEOUS
  Administered 2018-08-31 (×2): 3 [IU] via SUBCUTANEOUS
  Administered 2018-09-01: 8 [IU] via SUBCUTANEOUS
  Administered 2018-09-01: 2 [IU] via SUBCUTANEOUS
  Administered 2018-09-01: 8 [IU] via SUBCUTANEOUS
  Administered 2018-09-02: 5 [IU] via SUBCUTANEOUS
  Administered 2018-09-02 – 2018-09-03 (×3): 3 [IU] via SUBCUTANEOUS
  Administered 2018-09-03: 5 [IU] via SUBCUTANEOUS
  Administered 2018-09-04: 2 [IU] via SUBCUTANEOUS
  Administered 2018-09-04: 5 [IU] via SUBCUTANEOUS
  Administered 2018-09-04: 2 [IU] via SUBCUTANEOUS
  Administered 2018-09-05: 3 [IU] via SUBCUTANEOUS
  Administered 2018-09-05: 5 [IU] via SUBCUTANEOUS
  Administered 2018-09-05: 3 [IU] via SUBCUTANEOUS
  Administered 2018-09-06: 11 [IU] via SUBCUTANEOUS
  Administered 2018-09-06: 5 [IU] via SUBCUTANEOUS

## 2018-08-24 MED ORDER — AMIODARONE HCL 200 MG PO TABS
400.0000 mg | ORAL_TABLET | Freq: Two times a day (BID) | ORAL | Status: DC
  Administered 2018-08-24 – 2018-08-31 (×16): 400 mg via ORAL
  Filled 2018-08-24 (×16): qty 2

## 2018-08-24 MED ORDER — INSULIN ASPART 100 UNIT/ML ~~LOC~~ SOLN: 0.0000 [IU] | Freq: Three times a day (TID) | SUBCUTANEOUS | Status: DC

## 2018-08-24 MED ORDER — WARFARIN - PHARMACIST DOSING INPATIENT
Freq: Every day | Status: DC
  Administered 2018-08-24 – 2018-08-25 (×2)

## 2018-08-24 MED ORDER — INSULIN GLARGINE 100 UNIT/ML ~~LOC~~ SOLN
10.0000 [IU] | Freq: Every day | SUBCUTANEOUS | Status: DC
  Administered 2018-08-24 – 2018-09-06 (×13): 10 [IU] via SUBCUTANEOUS
  Filled 2018-08-24 (×14): qty 0.1

## 2018-08-24 MED ORDER — INSULIN ASPART 100 UNIT/ML ~~LOC~~ SOLN
0.0000 [IU] | Freq: Every day | SUBCUTANEOUS | Status: DC
  Administered 2018-08-26 – 2018-08-30 (×2): 2 [IU] via SUBCUTANEOUS
  Administered 2018-08-31 – 2018-09-03 (×2): 3 [IU] via SUBCUTANEOUS

## 2018-08-24 NOTE — Progress Notes (Signed)
ANTICOAGULATION CONSULT NOTE - Initial Consult  Pharmacy Consult for warfarin Indication: LV thrombus  No Known Allergies  Patient Measurements: Height: 5\' 9"  (175.3 cm) Weight: 205 lb 6.4 oz (93.2 kg)(scale a) IBW/kg (Calculated) : 70.7   Vital Signs: Temp: 97.8 F (36.6 C) (08/27 0700) Temp Source: Oral (08/27 0700) BP: 116/86 (08/27 0700)  Labs: Recent Labs    08/22/18 [id number]  08/23/18 0506 08/23/18 0645 08/23/18 0853 08/23/18 2001 08/24/18 0313 08/24/18 0447  HGB 12.2*  --  11.5*  --   --   --   --  11.3*  HCT 38.9*  --  36.2*  --   --   --   --  35.8*  PLT 225  --  182  --   --   --   --  180  HEPARINUNFRC 0.88*   < >  --  0.25*  --  0.31 0.32  --   CREATININE 3.47*  --   --   --  3.56*  --   --  3.38*   < > = values in this interval not displayed.    Estimated Creatinine Clearance: 24.9 mL/min (A) (by C-G formula based on SCr of 3.38 mg/dL (H)).   Medical History: Past Medical History:  Diagnosis Date  . Coronary artery disease   . Diabetes mellitus without complication (HCC)   . Hypertension     Medications:  Scheduled:  . amiodarone  400 mg Oral BID  . aspirin EC  81 mg Oral Daily  . atorvastatin  40 mg Oral q1800  . insulin aspart  0-15 Units Subcutaneous TID WC  . insulin aspart  0-5 Units Subcutaneous QHS  . insulin glargine  10 Units Subcutaneous Daily  . mouth rinse  15 mL Mouth Rinse BID  . omega-3 acid ethyl esters  1 g Oral QHS  . sertraline  25 mg Oral Daily  . sodium chloride flush  3 mL Intravenous Q12H   Infusions:  . sodium chloride    . furosemide Stopped (08/24/18 [id number])  . heparin 1,450 Units/hr (08/24/18 0700)   PRN: sodium chloride, acetaminophen **OR** acetaminophen, acetaminophen-codeine, docusate, fentaNYL, LORazepam, ondansetron (ZOFRAN) IV, RESOURCE THICKENUP CLEAR, sodium chloride flush, traZODone  Assessment: 62 YOM presents with 9 days of abdominal pain and increasing distension. Pt is s/p cath on 8/22 which showed  multivessel CAD but not surgical/PCI candidate  - continuing with medical management.  s/p  ECHO showed LV apical thrombus> started on heparin drip - follow up ECHO post VT arrest did not show thrombus but plan to continue anticoagulation. Code Stroke called 8/24. CT head negative for any acute findings. Small chronic infarction noted.  Goal of Therapy:  INR 2-3 Monitor platelets by anticoagulation protocol: Yes   Plan:  Warfarin 7.5 mg x 1 Continue heparin until INR > 1.8 Daily INR and CBC Monitor signs/symptoms of bleeding  Danae OrleansMegan McCarthy, PharmD PGY1 Pharmacy Resident Phone [phone] 08/24/2018       8:53 AM

## 2018-08-24 NOTE — Progress Notes (Signed)
Offered to walk with pt since he is being treated med. He declined, stating he has a stomachache and just doesn't want to do anything today. He also declined education. RN sts he has been upset by his diagnosis today. Will f/u tomorrow.  [id number] Jose Myers CES, ACSM 2:11 PM 08/24/2018

## 2018-08-24 NOTE — Plan of Care (Signed)
  Problem: Health Behavior/Discharge Planning: Goal: Ability to manage health-related needs will improve Outcome: Progressing   Problem: Clinical Measurements: Goal: Ability to maintain clinical measurements within normal limits will improve Outcome: Progressing Goal: Diagnostic test results will improve Outcome: Progressing Goal: Cardiovascular complication will be avoided Outcome: Progressing   Problem: Skin Integrity: Goal: Risk for impaired skin integrity will decrease Outcome: Progressing   Problem: Education: Goal: Ability to demonstrate management of disease process will improve Outcome: Progressing Goal: Ability to verbalize understanding of medication therapies will improve Outcome: Progressing Goal: Individualized Educational Video(s) Outcome: Progressing   Problem: Activity: Goal: Capacity to carry out activities will improve Outcome: Progressing   Problem: Cardiac: Goal: Ability to achieve and maintain adequate cardiopulmonary perfusion will improve Outcome: Progressing   Problem: Education: Goal: Understanding of CV disease, CV risk reduction, and recovery process will improve Outcome: Progressing Goal: Individualized Educational Video(s) Outcome: Progressing   Problem: Activity: Goal: Ability to return to baseline activity level will improve Outcome: Progressing   Problem: Cardiovascular: Goal: Ability to achieve and maintain adequate cardiovascular perfusion will improve Outcome: Progressing Goal: Vascular access site(s) Level 0-1 will be maintained Outcome: Progressing

## 2018-08-24 NOTE — Progress Notes (Signed)
Patient ID: Jose Myers, male   DOB: [birth year], 62 y.o.   MRN: [id number] [facility] Progress Note   Assessment/ Plan:   1.  Acute kidney injury: Likely secondary to ATN following cardiac arrest + contrast nephropathy.  Unimpressive urine output in response to high-dose furosemide with what appears to be possible plateau phase of ATN.  He does not have any acute indications for dialysis at this time and I recommend continued challenge with furosemide and monitoring for renal recovery. 2.  Status post ventricular tachycardia arrest: Without acute arrhythmias overnight-remains on amiodarone and heparin drip. 3.  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  Percutaneous intervention to be delayed in order not to worsen renal function. 4.  Ischemic cardiomyopathy with features of CHF exacerbation: Poor response to furosemide overnight-monitor at this time as he is able to maintain fair oxygen saturations on nasal cannula.  Subjective:   Reports some mild shortness of breath and discomfort from hospital bed   Objective:   BP 116/86   Pulse 87   Temp 97.8 F (36.6 C) (Oral)   Resp (!) 22   Ht 5\' 9"  (1.753 m)   Wt 93.2 kg Comment: scale a  SpO2 95%   BMI 30.33 kg/m   Intake/Output Summary (Last 24 hours) at 08/24/2018 6578 Last data filed at 08/24/2018 0700 Gross per 24 hour  Intake 1302.22 ml  Output 1055 ml  Net 247.22 ml   Weight change:   Physical Exam: Gen: Comfortably sitting up in bed, on oxygen via nasal cannula CVS: Pulse regular rhythm, normal rate, S1 and S2 normal Resp: Diminished breath sounds over bases without distinct rales or rhonchi Abd: Soft, obese, nontender Ext: No pretibial/lower extremity edema  Imaging: Dg Chest  Port 1 View  Result Date: 08/22/2018 CLINICAL DATA:  Acute respiratory failure. EXAM: PORTABLE CHEST 1 VIEW COMPARISON:  Radiograph of August 21, 2018. FINDINGS: Stable cardiomegaly and central pulmonary vascular congestion is noted. Endotracheal nasogastric tubes have been removed. Left internal jugular catheter is noted with tip in expected position of the SVC. Mildly increased bilateral perihilar and basilar interstitial densities are noted concerning for pulmonary edema and associated pleural effusions. No pneumothorax is noted. Bony thorax is unremarkable. IMPRESSION: Mildly increased bilateral perihilar and basilar interstitial densities are noted concerning for pulmonary edema and associated pleural effusions. Electronically Signed   By: Jose Myers, M.D.   On: 08/22/2018 12:15    Labs: BMET Recent Labs  Lab 08/19/18 0442 08/20/18 0257 08/20/18 1056 08/20/18 1329 08/21/18 0520 08/22/18 0337 08/23/18 0853 08/24/18 0447  NA 139 137 138  --  138 139 139 139  K 3.5 3.6 4.7  --  5.4* 4.2 3.9 3.6  CL 106 106 105  --  106 103 102 101  CO2 23 23 14*  --  21* 21* 26 24  GLUCOSE 162* 236* 374*  --  224* 251* 228* 157*  BUN 23 20 21   --  30* 41* 55* 60*  CREATININE 1.63* 1.61* 2.01*  --  2.56* 3.47* 3.56* 3.38*  CALCIUM 8.4* 8.4* 8.0*  --  7.8* 8.2* 8.4* 8.2*  PHOS  --   --   --  5.6* 5.1*  --  4.8* 4.7*   CBC Recent Labs  Lab 08/21/18 0340 08/22/18 4696 08/23/18 0506  08/24/18 0447  WBC 17.5* 17.2* 14.8* 7.3  HGB 13.4 12.2* 11.5* 11.3*  HCT 41.3 38.9* 36.2* 35.8*  MCV 87.9 89.6 89.6 89.1  PLT 242 225 182 180    Medications:    . aspirin EC  81 mg Oral Daily  . atorvastatin  40 mg Oral q1800  . glimepiride  4 mg Oral Q breakfast  . insulin aspart  0-9 Units Subcutaneous Q4H  . mouth rinse  15 mL Mouth Rinse BID  . omega-3 acid ethyl esters  1 g Oral QHS  . sertraline  25 mg Oral Daily  . sodium chloride flush  3 mL Intravenous Q12H    Jose BillsJay Burgundy Matuszak, MD 08/24/2018, 8:11  AM

## 2018-08-24 NOTE — Progress Notes (Signed)
Progress Note  Patient Name: Jose Myers Date of Encounter: 08/24/2018  Primary Cardiologist: NEw  Subjective   Chest sore with breathing   Breathing is fair  Inpatient Medications    Scheduled Meds: . aspirin EC  81 mg Oral Daily  . atorvastatin  40 mg Oral q1800  . glimepiride  4 mg Oral Q breakfast  . insulin aspart  0-9 Units Subcutaneous Q4H  . mouth rinse  15 mL Mouth Rinse BID  . omega-3 acid ethyl esters  1 g Oral QHS  . sertraline  25 mg Oral Daily  . sodium chloride flush  3 mL Intravenous Q12H   Continuous Infusions: . sodium chloride    . amiodarone 30 mg/hr (08/24/18 0700)  . furosemide Stopped (08/24/18 6045)  . heparin 1,450 Units/hr (08/24/18 0700)   PRN Meds: sodium chloride, acetaminophen **OR** acetaminophen, acetaminophen-codeine, docusate, fentaNYL, LORazepam, ondansetron (ZOFRAN) IV, RESOURCE THICKENUP CLEAR, sodium chloride flush, traZODone   Vital Signs    Vitals:   08/24/18 0409 08/24/18 0500 08/24/18 0600 08/24/18 0700  BP:  110/77 112/84 116/86  Pulse:      Resp: 20 20 (!) 23 (!) 22  Temp: 98 F (36.7 C)     TempSrc: Oral     SpO2:      Weight:      Height:        Intake/Output Summary (Last 24 hours) at 08/24/2018 0803 Last data filed at 08/24/2018 0700 Gross per 24 hour  Intake 1302.22 ml  Output 1055 ml  Net 247.22 ml   Filed Weights   08/18/18 0431 08/19/18 0515 08/20/18 0459  Weight: 94 kg 93.8 kg 93.2 kg    Telemetry   SR   Rare PVC  ECG     Physical Exam  Awake, alert GEN: No acute distress.   Neck: JVP is normal   Cardiac: RRR with occasional ectopy, no murmurs, rubs, or gallops. PRominent pop with inspiration    Tr LE edema   Respiratory: Clear to auscultation bilaterally.  Tender anterior right rib cage GI: Soft, nontender, non-distended  MS: No edema; No deformity. Neuro:  Nonfocal  Psych: Normal affect   Labs    Chemistry Recent Labs  Lab 08/20/18 1056 08/21/18 0340 08/21/18 0520  08/22/18 0337 08/23/18 0853 08/24/18 0447  NA 138  --  138 139 139 139  K 4.7  --  5.4* 4.2 3.9 3.6  CL 105  --  106 103 102 101  CO2 14*  --  21* 21* 26 24  GLUCOSE 374*  --  224* 251* 228* 157*  BUN 21  --  30* 41* 55* 60*  CREATININE 2.01*  --  2.56* 3.47* 3.56* 3.38*  CALCIUM 8.0*  --  7.8* 8.2* 8.4* 8.2*  PROT 5.5* SPECIMEN HEMOLYZED. HEMOLYSIS MAY AFFECT INTEGRITY OF RESULTS. 5.5*  --   --   --   ALBUMIN 2.8* SPECIMEN HEMOLYZED. HEMOLYSIS MAY AFFECT INTEGRITY OF RESULTS. 2.8*  --  2.9* 2.7*  AST 81* SPECIMEN HEMOLYZED. HEMOLYSIS MAY AFFECT INTEGRITY OF RESULTS. 91*  --   --   --   ALT 55* SPECIMEN HEMOLYZED. HEMOLYSIS MAY AFFECT INTEGRITY OF RESULTS. 63*  --   --   --   ALKPHOS 43 SPECIMEN HEMOLYZED. HEMOLYSIS MAY AFFECT INTEGRITY OF RESULTS. 41  --   --   --   BILITOT 1.5* SPECIMEN HEMOLYZED. HEMOLYSIS MAY AFFECT INTEGRITY OF RESULTS. 1.4*  --   --   --   GFRNONAA 33*  --  25* 17* 17* 18*  GFRAA 39*  --  29* 20* 19* 21*  ANIONGAP 19*  --  11 15 11 14      Hematology Recent Labs  Lab 08/22/18 0337 08/23/18 0506 08/24/18 0447  WBC 17.2* 14.8* 7.3  RBC 4.34 4.04* 4.02*  HGB 12.2* 11.5* 11.3*  HCT 38.9* 36.2* 35.8*  MCV 89.6 89.6 89.1  MCH 28.1 28.5 28.1  MCHC 31.4 31.8 31.6  RDW 14.8 14.7 14.6  PLT 225 182 180    Cardiac Enzymes Recent Labs  Lab 08/20/18 1056 08/20/18 1716 08/20/18 2324  TROPONINI 0.75* 1.74* 5.76*    No results for input(s): TROPIPOC in the last 168 hours.   BNP Recent Labs  Lab 08/20/18 1258  BNP 1,184.0*     DDimer No results for input(s): DDIMER in the last 168 hours.   Radiology    Dg Chest Port 1 View  Result Date: 08/22/2018 CLINICAL DATA:  Acute respiratory failure. EXAM: PORTABLE CHEST 1 VIEW COMPARISON:  Radiograph of August 21, 2018. FINDINGS: Stable cardiomegaly and central pulmonary vascular congestion is noted. Endotracheal nasogastric tubes have been removed. Left internal jugular catheter is noted with tip in expected  position of the SVC. Mildly increased bilateral perihilar and basilar interstitial densities are noted concerning for pulmonary edema and associated pleural effusions. No pneumothorax is noted. Bony thorax is unremarkable. IMPRESSION: Mildly increased bilateral perihilar and basilar interstitial densities are noted concerning for pulmonary edema and associated pleural effusions. Electronically Signed   By: Lupita RaiderJames  Green Jr, M.D.   On: 08/22/2018 12:15    Cardiac Studies   August 20, 2018 echo - Left ventricle: Systolic function was severely reduced. The estimated ejection fraction was in the range of 25% to 30%. Dyskinesis and scarring of the apical myocardium. Akinesis of the mid-apicalanteroseptal, anterior, and anterolateral myocardium; consistent with infarction in the distribution of the left anterior descending coronary artery; unchanged from the study of 08/18/2018. Doppler parameters are consistent with restrictive physiology, indicative of decreased left ventricular diastolic compliance and/or increased left atrial pressure. No evidence of thrombus. - Mitral valve: Calcified annulus. There was severe regurgitation directed centrally. The acceleration rate of the regurgitant jet was reduced, consistent with a low dP/dt. Severe regurgitation is suggested by pulmonary vein systolic flow reversal. - Left atrium: The atrium was severely dilated. - Tricuspid valve: There was mild-moderate regurgitation directed centrally. - Pulmonary arteries: Systolic pressure was mildly increased. PA peak pressure: 43 mm Hg (S).  Impressions:  - Compared to the previous study images, the large LV apical thrombus is no longer present. Wall motion and EF are unchanged, but the mitral insufficiency appears worse.  TTE8/21/19 Study Conclusions  - Left ventricle: The cavity size was normal. Wall thickness was increased in a pattern of mild LVH. Systolic  function was severely reduced. The estimated ejection fraction was in the range of 25% to 30%. Diffuse hypokinesis. There is akinesis of the anteroseptal, anterior, and anterolateral myocardium. There was a large, 3.3 cm (L) x 1.8 cm (W), spherical, fixed, apical anteriorthrombusassociated with an akinetic segment. - Aortic valve: Trileaflet; mildly thickened, mildly calcified leaflets. - Mitral valve: There was moderate regurgitation. - Left atrium: The atrium was mildly dilated. - Tricuspid valve: There was mild regurgitation. - Pulmonary arteries: Systolic pressure was moderately increased. PA peak pressure: 59 mm Hg (S).  Cath 08/19/18  Severe multivessel CAD including left main. Distal left main 85%.  Diffusely diseased and totally occluded LAD in the mid segment. Proximal 80 to  99% obstruction. Diagonals are totally occluded.  80% ostial circumflex, 90% proximal first obtuse marginal, and total occlusion of the circumflex beyond the first marginal. Diffuse disease in the first marginal.  Dominant right coronary with segmental 70% distal stenosis, 85% distal RCA beyond the PDA but before the first large left ventricular branch. Diffuse disease in the proximal to midportion of the large right ventricular branch and the second posterolateral branch.  Left ventriculography and hemodynamic recordings were not performed because of LV apical thrombus.  Recommendations:   T CTS evaluation for surgical revascularization.  Guideline directed therapy for systolic heart failure: SGLT2 therapy for diabetes, resume ARB but preferably Entresto for decreased LV function, beta-blocker therapy as tolerated, and diuresis as needed for symptom control.  Monitor kidney function closely.  Anticoagulation for acute coronary syndrome and LV thrombus  Patient Profile     62 y.o. male with history of CAD and remote placement of an LAD stent 20 years ago, diabetes mellitus,  presented with findings of acute biventricular heart failure and delayed presentation of STEMI.  Left ventricular apical thrombus present.  Cardiac catheterization showed extensive multivessel CAD.  During work-up, developed monomorphic VT arrest with rapid resuscitation on August 22.  Successfully extubated August 24.  Developed acute kidney injury and urinary bladder retention.  Assessment & Plan    1. VT arrest:  On amiodarone   Occasional PVC   Will review with EP   2. CAD   I have reviewed case with Drs [place] and [facility]   Pt with severe, diffuse disease of all 3 major vessels   Do not  feel he is a candidate for intervention Plan for medical therapy   Limited now by renal function  3. LV thrombus: Initial large thrombus is no longer seen and has probably embolized, but he is at high risk for redeveloping LV clot.  Will require anticoagulation   Will get pharmacy to follow on transition to coumadin  4. CHF   Volume status is increased some  Pt is comfortable    Limited by renal function     5  AKI:    Cr 3.38  6.   DM  With renal dysfunction switch to insulin   For questions or updates, please contact [facility] Please consult [email or web address] for contact info under Cardiology/STEMI.      Signed, Dietrich Pates, MD  08/24/2018, 8:03 AM

## 2018-08-24 NOTE — Progress Notes (Signed)
Patient has been very quiet today, he has asked a few questions about his prognosis as well as limitations when he goes home. We have talked about doing things in moderation, diet changes, listening to his body and not pushing too far. His wife has been present for these conversations and understands the importance of making changes for his health. Jose Myers did make an effort to eat part of his meal tonight and RN has encouraged his wife to bring him in things that he might be more interested in eating just to get him to improve his intake. His attitude toward the staff was better today and he was very grateful and thankful for all the help that has been given and was very courteous in comparison to previous encounters. Rn has provided education on coumadin and administered his first dose this evening.

## 2018-08-24 NOTE — Progress Notes (Signed)
ANTICOAGULATION CONSULT NOTE   Pharmacy Consult:  Heparin Indication: chest pain/ACS/LV apical thrombus / acute CVA possible  No Known Allergies  Patient Measurements: Height: 5\' 9"  (175.3 cm) Weight: 205 lb 6.4 oz (93.2 kg)(scale a) IBW/kg (Calculated) : 70.7 Heparin Dosing Weight: 90 kg  Vital Signs: Temp: 98 F (36.7 C) (08/27 0409) Temp Source: Oral (08/27 0409) BP: 112/84 (08/27 0600)  Labs: Recent Labs    08/22/18 [id number]  08/23/18 0506 08/23/18 0645 08/23/18 0853 08/23/18 2001 08/24/18 0313 08/24/18 0447  HGB 12.2*  --  11.5*  --   --   --   --  11.3*  HCT 38.9*  --  36.2*  --   --   --   --  35.8*  PLT 225  --  182  --   --   --   --  180  HEPARINUNFRC 0.88*   < >  --  0.25*  --  0.31 0.32  --   CREATININE 3.47*  --   --   --  3.56*  --   --  3.38*   < > = values in this interval not displayed.    Estimated Creatinine Clearance: 24.9 mL/min (A) (by C-G formula based on SCr of 3.38 mg/dL (H)).  Assessment: [age] YOM presents with 9 days of abdominal pain and increasing distension. Pt is s/p cath on 8/22 which showed multivessel CAD but not surgical candidate  - discussing PCI.  s/p  ECHO showed LV apical thrombus> started on heparin drip - follow up ECHO post VT arrest did not show thrombus but plan to continue anticoagulation. Code Stroke called 8/24. CT head negative for any acute findings. Small chronic infarction noted.  Heparin level remains at goal. No overt bleeding or complications noted. CBC stable.  Goal of Therapy:  Heparin level 0.3-0.5 units/ml Monitor platelets by anticoagulation protocol: Yes   Plan: Continue IV heparin at current rate of 1,450 units/hr Daily heparin level and CBC  Danae OrleansMegan McCarthy, PharmD PGY1 Pharmacy Resident Phone [phone] 08/24/2018       7:21 AM

## 2018-08-25 ENCOUNTER — Inpatient Hospital Stay (HOSPITAL_COMMUNITY): Payer: BLUE CROSS/BLUE SHIELD

## 2018-08-25 LAB — GLUCOSE, CAPILLARY
GLUCOSE-CAPILLARY: 162 mg/dL — AB (ref 70–99)
GLUCOSE-CAPILLARY: 165 mg/dL — AB (ref 70–99)
Glucose-Capillary: 213 mg/dL — ABNORMAL HIGH (ref 70–99)
Glucose-Capillary: 116 mg/dL — ABNORMAL HIGH (ref 70–99)

## 2018-08-25 LAB — CBC
HCT: 34.8 % — ABNORMAL LOW (ref 39.0–52.0)
Hemoglobin: 11.3 g/dL — ABNORMAL LOW (ref 13.0–17.0)
MCH: 28.5 pg (ref 26.0–34.0)
MCHC: 32.5 g/dL (ref 30.0–36.0)
MCV: 87.9 fL (ref 78.0–100.0)
Platelets: 179 10*3/uL (ref 150–400)
RBC: 3.96 MIL/uL — ABNORMAL LOW (ref 4.22–5.81)
RDW: 14.2 % (ref 11.5–15.5)
WBC: 8.4 10*3/uL (ref 4.0–10.5)

## 2018-08-25 LAB — RENAL FUNCTION PANEL
ALBUMIN: 2.7 g/dL — AB (ref 3.5–5.0)
Anion gap: 9 (ref 5–15)
BUN: 58 mg/dL — AB (ref 8–23)
CO2: 27 mmol/L (ref 22–32)
CREATININE: 2.93 mg/dL — AB (ref 0.61–1.24)
Calcium: 8.2 mg/dL — ABNORMAL LOW (ref 8.9–10.3)
Chloride: 99 mmol/L (ref 98–111)
GFR calc Af Amer: 25 mL/min — ABNORMAL LOW (ref >60)
GFR calc non Af Amer: 21 mL/min — ABNORMAL LOW (ref >60)
GLUCOSE: 144 mg/dL — AB (ref 70–99)
PHOSPHORUS: 4.2 mg/dL (ref 2.5–4.6)
POTASSIUM: 3.2 mmol/L — AB (ref 3.5–5.1)
SODIUM: 135 mmol/L (ref 135–145)

## 2018-08-25 LAB — HEPARIN LEVEL (UNFRACTIONATED)
HEPARIN UNFRACTIONATED: 0.56 [IU]/mL (ref 0.30–0.70)
Heparin Unfractionated: 0.27 IU/mL — ABNORMAL LOW (ref 0.30–0.70)
Heparin Unfractionated: 0.29 IU/mL — ABNORMAL LOW (ref 0.30–0.70)

## 2018-08-25 LAB — PROTIME-INR
INR: 1.3
Prothrombin Time: 16.1 seconds — ABNORMAL HIGH (ref 11.4–15.2)

## 2018-08-25 MED ORDER — SODIUM CHLORIDE 0.9% FLUSH
10.0000 mL | INTRAVENOUS | Status: DC | PRN
  Administered 2018-09-02: 10 mL
  Filled 2018-08-25: qty 40

## 2018-08-25 MED ORDER — POTASSIUM CHLORIDE CRYS ER 20 MEQ PO TBCR
60.0000 meq | EXTENDED_RELEASE_TABLET | Freq: Once | ORAL | Status: AC
  Administered 2018-08-25: 60 meq via ORAL
  Filled 2018-08-25: qty 3

## 2018-08-25 MED ORDER — CHLORHEXIDINE GLUCONATE CLOTH 2 % EX PADS
6.0000 | MEDICATED_PAD | Freq: Every day | CUTANEOUS | Status: DC
  Administered 2018-08-25 – 2018-08-26 (×2): 6 via TOPICAL

## 2018-08-25 MED ORDER — WARFARIN SODIUM 5 MG PO TABS
5.0000 mg | ORAL_TABLET | Freq: Once | ORAL | Status: AC
  Administered 2018-08-25: 5 mg via ORAL
  Filled 2018-08-25: qty 1

## 2018-08-25 NOTE — Progress Notes (Addendum)
ANTICOAGULATION CONSULT NOTE   Pharmacy Consult:  Heparin, warfarin Indication: chest pain/ACS/LV apical thrombus  No Known Allergies  Patient Measurements: Height: 5\' 9"  (175.3 cm) Weight: 205 lb 6.4 oz (93.2 kg)(scale a) IBW/kg (Calculated) : 70.7 Heparin Dosing Weight: 90 kg  Vital Signs: Temp: 98 F (36.7 C) (08/28 2000) Temp Source: Oral (08/28 2000) BP: 119/88 (08/28 2100) Pulse Rate: 74 (08/28 2100)  Labs: Recent Labs    08/23/18 0506  08/23/18 0853  08/24/18 0447 08/25/18 0358 08/25/18 1400 08/25/18 2001  HGB 11.5*  --   --   --  11.3* 11.3*  --   --   HCT 36.2*  --   --   --  35.8* 34.8*  --   --   PLT 182  --   --   --  180 179  --   --   LABPROT  --   --   --   --   --  16.1*  --   --   INR  --   --   --   --   --  1.30  --   --   HEPARINUNFRC  --    < >  --    < >  --  0.27* 0.29* 0.56  CREATININE  --   --  3.56*  --  3.38* 2.93*  --   --    < > = values in this interval not displayed.    Estimated Creatinine Clearance: 28.7 mL/min (A) (by C-G formula based on SCr of 2.93 mg/dL (H)).  Assessment: [name] presents with 9 days of abdominal pain and increasing distension. Pt is s/p cath on 8/22 which showed multivessel CAD but not surgical/PCI candidate - continuing with medical management.  s/p  ECHO showed LV apical thrombus> started on heparin drip - follow up ECHO post VT arrest did not show thrombus but plan to continue anticoagulation. Code Stroke called 8/24. CT head negative for any acute findings. Small chronic infarction noted.  Patient was not on anticoagulation PTA.  Heparin level is at upper end  Of goal this evening at 0.56 on 1650 units/hr. Aiming for slightly lower goal so will make small rate adjustment.     Goal of Therapy:  Heparin level 0.3-0.5 units/ml Monitor platelets by anticoagulation protocol: Yes  INR 2-3   Plan:  Reduce heparin drip to 1600 units/hr Continue heparin drip until INR > 1.8 Daily INR and CBC  Jose Myers  PharmD., BCPS Clinical Pharmacist 08/25/2018 9:32 PM

## 2018-08-25 NOTE — Progress Notes (Signed)
Progress Note  Patient Name: Jose Myers Date of Encounter: 08/25/2018  Primary Cardiologist: NEw  Subjective   Breathing is OK  CP with breathing  Inpatient Medications    Scheduled Meds: . amiodarone  400 mg Oral BID  . aspirin EC  81 mg Oral Daily  . atorvastatin  40 mg Oral q1800  . insulin aspart  0-15 Units Subcutaneous TID WC  . insulin aspart  0-5 Units Subcutaneous QHS  . insulin glargine  10 Units Subcutaneous Daily  . mouth rinse  15 mL Mouth Rinse BID  . omega-3 acid ethyl esters  1 g Oral QHS  . sertraline  25 mg Oral Daily  . sodium chloride flush  3 mL Intravenous Q12H  . Warfarin - Pharmacist Dosing Inpatient   Does not apply q1800   Continuous Infusions: . sodium chloride    . furosemide 62 mL/hr at 08/25/18 0600  . heparin 1,550 Units/hr (08/25/18 0636)   PRN Meds: sodium chloride, acetaminophen **OR** acetaminophen, acetaminophen-codeine, docusate, fentaNYL, LORazepam, ondansetron (ZOFRAN) IV, RESOURCE THICKENUP CLEAR, sodium chloride flush, traZODone   Vital Signs    Vitals:   08/25/18 0300 08/25/18 0400 08/25/18 0500 08/25/18 0600  BP: 115/80 109/79 116/81 115/81  Pulse: 76 77 75 76  Resp: 16 18 15 15   Temp:      TempSrc:      SpO2: 97% 93% 93% 91%  Weight:      Height:        Intake/Output Summary (Last 24 hours) at 08/25/2018 0648 Last data filed at 08/25/2018 0600 Gross per 24 hour  Intake 1687.89 ml  Output 2160 ml  Net -472.11 ml   Filed Weights   08/18/18 0431 08/19/18 0515 08/20/18 0459  Weight: 94 kg 93.8 kg 93.2 kg    Telemetry   SR- personally reviewed  ECG     Physical Exam  Awake, alert GEN: No acute distress.   Neck: JVP is normal   Cardiac: RRR with occasional ectopy, no murmurs, rubs, or gallops. PRominent pop with inspiration      Respiratory: Clear to auscultation bilaterally.  Tender anterior right rib cage  Decreased BS R base GI: Soft, nontender, non-distended  MS: 1+ edema; No deformity. Neuro:   Nonfocal  Psych: Normal affect   Labs    Chemistry Recent Labs  Lab 08/20/18 1056 08/21/18 0340 08/21/18 0520  08/23/18 0853 08/24/18 0447 08/25/18 0358  NA 138  --  138   < > 139 139 135  K 4.7  --  5.4*   < > 3.9 3.6 3.2*  CL 105  --  106   < > 102 101 99  CO2 14*  --  21*   < > 26 24 27   GLUCOSE 374*  --  224*   < > 228* 157* 144*  BUN 21  --  30*   < > 55* 60* 58*  CREATININE 2.01*  --  2.56*   < > 3.56* 3.38* 2.93*  CALCIUM 8.0*  --  7.8*   < > 8.4* 8.2* 8.2*  PROT 5.5* SPECIMEN HEMOLYZED. HEMOLYSIS MAY AFFECT INTEGRITY OF RESULTS. 5.5*  --   --   --   --   ALBUMIN 2.8* SPECIMEN HEMOLYZED. HEMOLYSIS MAY AFFECT INTEGRITY OF RESULTS. 2.8*  --  2.9* 2.7* 2.7*  AST 81* SPECIMEN HEMOLYZED. HEMOLYSIS MAY AFFECT INTEGRITY OF RESULTS. 91*  --   --   --   --   ALT 55* SPECIMEN HEMOLYZED. HEMOLYSIS MAY  AFFECT INTEGRITY OF RESULTS. 63*  --   --   --   --   ALKPHOS 43 SPECIMEN HEMOLYZED. HEMOLYSIS MAY AFFECT INTEGRITY OF RESULTS. 41  --   --   --   --   BILITOT 1.5* SPECIMEN HEMOLYZED. HEMOLYSIS MAY AFFECT INTEGRITY OF RESULTS. 1.4*  --   --   --   --   GFRNONAA 33*  --  25*   < > 17* 18* 21*  GFRAA 39*  --  29*   < > 19* 21* 25*  ANIONGAP 19*  --  11   < > 11 14 9    < > = values in this interval not displayed.     Hematology Recent Labs  Lab 08/23/18 0506 08/24/18 0447 08/25/18 0358  WBC 14.8* 7.3 8.4  RBC 4.04* 4.02* 3.96*  HGB 11.5* 11.3* 11.3*  HCT 36.2* 35.8* 34.8*  MCV 89.6 89.1 87.9  MCH 28.5 28.1 28.5  MCHC 31.8 31.6 32.5  RDW 14.7 14.6 14.2  PLT 182 180 179    Cardiac Enzymes Recent Labs  Lab 08/20/18 1056 08/20/18 1716 08/20/18 2324  TROPONINI 0.75* 1.74* 5.76*    No results for input(s): TROPIPOC in the last 168 hours.   BNP Recent Labs  Lab 08/20/18 1258  BNP 1,184.0*     DDimer No results for input(s): DDIMER in the last 168 hours.   Radiology    No results found.  Cardiac Studies   August 20, 2018 echo - Left ventricle: Systolic  function was severely reduced. The estimated ejection fraction was in the range of 25% to 30%. Dyskinesis and scarring of the apical myocardium. Akinesis of the mid-apicalanteroseptal, anterior, and anterolateral myocardium; consistent with infarction in the distribution of the left anterior descending coronary artery; unchanged from the study of 08/18/2018. Doppler parameters are consistent with restrictive physiology, indicative of decreased left ventricular diastolic compliance and/or increased left atrial pressure. No evidence of thrombus. - Mitral valve: Calcified annulus. There was severe regurgitation directed centrally. The acceleration rate of the regurgitant jet was reduced, consistent with a low dP/dt. Severe regurgitation is suggested by pulmonary vein systolic flow reversal. - Left atrium: The atrium was severely dilated. - Tricuspid valve: There was mild-moderate regurgitation directed centrally. - Pulmonary arteries: Systolic pressure was mildly increased. PA peak pressure: 43 mm Hg (S).  Impressions:  - Compared to the previous study images, the large LV apical thrombus is no longer present. Wall motion and EF are unchanged, but the mitral insufficiency appears worse.  TTE8/21/19 Study Conclusions  - Left ventricle: The cavity size was normal. Wall thickness was increased in a pattern of mild LVH. Systolic function was severely reduced. The estimated ejection fraction was in the range of 25% to 30%. Diffuse hypokinesis. There is akinesis of the anteroseptal, anterior, and anterolateral myocardium. There was a large, 3.3 cm (L) x 1.8 cm (W), spherical, fixed, apical anteriorthrombusassociated with an akinetic segment. - Aortic valve: Trileaflet; mildly thickened, mildly calcified leaflets. - Mitral valve: There was moderate regurgitation. - Left atrium: The atrium was mildly dilated. - Tricuspid valve: There was  mild regurgitation. - Pulmonary arteries: Systolic pressure was moderately increased. PA peak pressure: 59 mm Hg (S).  Cath 08/19/18  Severe multivessel CAD including left main. Distal left main 85%.  Diffusely diseased and totally occluded LAD in the mid segment. Proximal 80 to 99% obstruction. Diagonals are totally occluded.  80% ostial circumflex, 90% proximal first obtuse marginal, and total occlusion of the circumflex beyond  the first marginal. Diffuse disease in the first marginal.  Dominant right coronary with segmental 70% distal stenosis, 85% distal RCA beyond the PDA but before the first large left ventricular branch. Diffuse disease in the proximal to midportion of the large right ventricular branch and the second posterolateral branch.  Left ventriculography and hemodynamic recordings were not performed because of LV apical thrombus.  Recommendations:   T CTS evaluation for surgical revascularization.  Guideline directed therapy for systolic heart failure: SGLT2 therapy for diabetes, resume ARB but preferably Entresto for decreased LV function, beta-blocker therapy as tolerated, and diuresis as needed for symptom control.  Monitor kidney function closely.  Anticoagulation for acute coronary syndrome and LV thrombus  Patient Profile     62 y.o. male with history of CAD and remote placement of an LAD stent 20 years ago, diabetes mellitus, presented with findings of acute biventricular heart failure and delayed presentation of STEMI.  Left ventricular apical thrombus present.  Cardiac catheterization showed extensive multivessel CAD.  During work-up, developed monomorphic VT arrest with rapid resuscitation on August 22.  Successfully extubated August 24.  Developed acute kidney injury and urinary bladder retention.  Assessment & Plan    1. VT arrest:  On amiodarone   Minimal ectopy   I have asked EP to evaluate/make recommendations   2. CAD  Pt with severe 3V dz    Not considered a revascularization candidate by interventional cardiology or cardiac surgery   Will work with medical Rx   Limited at present by renal dysfunction   3. LV thrombus:   Repeat echo this admit, apical thrombus was no longer present   Now on heparin/coumadin  4. CHF      Volume status is increased   Pt comfortable    Renal function is improving  5  AKI:   Cr improved from yesterday   Cont to follow      For questions or updates, please contact [facility] Please consult [email or web address] for contact info under Cardiology/STEMI.      Signed, Dietrich PatesPaula Zhane Bluitt, MD  08/25/2018, 6:48 AM

## 2018-08-25 NOTE — Progress Notes (Signed)
OT Cancellation Note  Patient Details Name: Jose Myers MRN: [id number] DOB: 10-Mar-1956   Cancelled Treatment:    Reason Eval/Treat Not Completed: Patient declined, no reason specified(RN and family reporting that pt vomited early this morning and is now resting. Requesting to return later. Will return as schedule allows.)  Janes Colegrove M Chrystal Zeimet Riann Oman MSOT, OTR/L Acute Rehab Pager: [phone] Office: [phone] 08/25/2018, 3:37 PM

## 2018-08-25 NOTE — Progress Notes (Addendum)
ANTICOAGULATION CONSULT NOTE   Pharmacy Consult:  Heparin, warfarin Indication: chest pain/ACS/LV apical thrombus  No Known Allergies  Patient Measurements: Height: 5\' 9"  (175.3 cm) Weight: 205 lb 6.4 oz (93.2 kg)(scale a) IBW/kg (Calculated) : 70.7 Heparin Dosing Weight: 90 kg  Vital Signs: Temp: 98.7 F (37.1 C) (08/28 1216) Temp Source: Oral (08/28 1216) BP: 108/75 (08/28 1400) Pulse Rate: 75 (08/28 1400)  Labs: Recent Labs    08/23/18 0506  08/23/18 0853  08/24/18 0313 08/24/18 0447 08/25/18 0358 08/25/18 1400  HGB 11.5*  --   --   --   --  11.3* 11.3*  --   HCT 36.2*  --   --   --   --  35.8* 34.8*  --   PLT 182  --   --   --   --  180 179  --   LABPROT  --   --   --   --   --   --  16.1*  --   INR  --   --   --   --   --   --  1.30  --   HEPARINUNFRC  --    < >  --    < > 0.32  --  0.27* 0.29*  CREATININE  --   --  3.56*  --   --  3.38* 2.93*  --    < > = values in this interval not displayed.    Estimated Creatinine Clearance: 28.7 mL/min (A) (by C-G formula based on SCr of 2.93 mg/dL (H)).  Assessment: [age] YOM presents with 9 days of abdominal pain and increasing distension. Pt is s/p cath on 8/22 which showed multivessel CAD but not surgical/PCI candidate - continuing with medical management.  s/p  ECHO showed LV apical thrombus> started on heparin drip - follow up ECHO post VT arrest did not show thrombus but plan to continue anticoagulation. Code Stroke called 8/24. CT head negative for any acute findings. Small chronic infarction noted.  Patient was not on anticoagulation PTA.  Heparin level is slightly subtherapeutic at 0.29 on 1550 units/hr. No bleeding reported by RN. Hgb stable around 11.3  INR today is 1.3  Goal of Therapy:  Heparin level 0.3-0.5 units/ml Monitor platelets by anticoagulation protocol: Yes  INR 2-3   Plan:  Warfarin 5 mg x 1 tonight Increase heparin drip to 1650 units/hr 2100 heparin level Continue heparin drip until INR >  1.8 Daily INR and CBC  Danae OrleansMegan Jenisa Monty, PharmD PGY1 Pharmacy Resident Phone [phone] 08/25/2018       2:33 PM

## 2018-08-25 NOTE — Progress Notes (Signed)
Spoke with nurse Melissa RN regarding patient's POC and best options for vascular access. Tomasita MorrowHeather Kinser Fellman, RN VAST

## 2018-08-25 NOTE — Consult Note (Addendum)
Cardiology Consultation:   Patient ID: Jose Myers; [id number]; 1956-12-01   Admit date: 08/15/2018 Date of Consult: 08/25/2018  Primary Care Provider: Daisy Floro, MD Primary Cardiologist:    Patient Profile:   Jose Myers is a 62 y.o. male with a hx of CAD, DM, HTN who is being seen today for the evaluation of possible ICD implant at the request of Dr. Tenny Craw.  History of Present Illness:   Jose Myers was admitted 08/15/18 with c/o abdominal pain noted abn Trop and EKG, evidence of fluid OL, and AKI.  Ruled in for NSTEMI, planned for cath pending renal function improvement, and able to tolerate from CHF perspective.  TTE done, noted LVEF 25-30% and LV apical thrombus (already on heparin gtt for NSTEM).  He underwent cath 08/19/18 noting severe multivessel CAD, including LM disease, CTS evaluation was done and felt to have poor target vessels and complete revascularization not possible, found to be too high risk for CABG, with preference to consider PCI to CABG, noting that Impella would not be an option given LV thrombus.  08/20/18 suffered a VT arrest, requiring CPR,  2 rounds of epi, 2 defibrillations, intubation, code time was 16 minutes with 6 minutes of ROSC in between. He was given Mag 2 grams, and bolused  with amiodarone and started on gtt. Ultimately CTS decided not a CABG candidate, and if meaningful recovery from arrest, to consider high-risk PCI. Post arrest TTE 08/20/18 noted apical thrombus was gone, other LVEF remained same extubated 08/21/18  08/22/18, code stroke called for acute AMS, neuro though most c/w delirium, though noted possible subacute cerebellar stroke by imaging(likelly artifact by their notes) and normal brain MRI done only an half hour prior to that. Nephrology consulted with worsening renal function, not felt will need dialysis, with improving urine OP furosemide challenge.  08/24/18, cardiology notes in discussion/review of films with  interventional cardiologists, not felt to be a PCI candidate.  EP is asked to weigh in on ICD  LABS today K+ 3.2 BUN/Creat 58/2.93 (trending down from peak 3.56) WBC 8.4 H/H 11/34 Plts 179 INR 1.30  On (among other meds)  amiodarone 400mg  BID ASA Heparin gtt/warfarin  The patient is OOB to chair, reports ambulating daily in the halls, "I did 2 miiles already today", and outside of his chest wall pain, feels like he is doing well.  I would like consideration for ICD  Past Medical History:  Diagnosis Date  . Coronary artery disease   . Diabetes mellitus without complication (HCC)   . Hypertension     Past Surgical History:  Procedure Laterality Date  . CARDIAC CATHETERIZATION    . CORONARY ANGIOGRAPHY N/A 08/19/2018   Procedure: CORONARY ANGIOGRAPHY (CATH LAB);  Surgeon: Lyn Records, MD;  Location: [facility] INVASIVE CV LAB;  Service: Cardiovascular;  Laterality: N/A;  . EYE SURGERY    . PERCUTANEOUS CORONARY STENT INTERVENTION (PCI-S)  04/11/2003   LAD  . RIGHT HEART CATH N/A 08/19/2018   Procedure: RIGHT HEART CATH;  Surgeon: Lyn Records, MD;  Location: [facility] INVASIVE CV LAB;  Service: Cardiovascular;  Laterality: N/A;      Inpatient Medications: Scheduled Meds: . amiodarone  400 mg Oral BID  . aspirin EC  81 mg Oral Daily  . atorvastatin  40 mg Oral q1800  . insulin aspart  0-15 Units Subcutaneous TID WC  . insulin aspart  0-5 Units Subcutaneous QHS  . insulin glargine  10 Units Subcutaneous Daily  . mouth rinse  15 mL Mouth Rinse BID  . omega-3 acid ethyl esters  1 g Oral QHS  . sertraline  25 mg Oral Daily  . sodium chloride flush  3 mL Intravenous Q12H  . Warfarin - Pharmacist Dosing Inpatient   Does not apply q1800   Continuous Infusions: . sodium chloride    . furosemide Stopped (08/25/18 0659)  . heparin 1,550 Units/hr (08/25/18 1100)   PRN Meds: sodium chloride, acetaminophen **OR** acetaminophen, acetaminophen-codeine, docusate, fentaNYL, LORazepam,  ondansetron (ZOFRAN) IV, RESOURCE THICKENUP CLEAR, sodium chloride flush, traZODone  Allergies:   No Known Allergies  Social History:   Social History   Socioeconomic History  . Marital status: Married    Spouse name: Not on file  . Number of children: Not on file  . Years of education: Not on file  . Highest education level: Not on file  Occupational History  . Not on file  Social Needs  . Financial resource strain: Not on file  . Food insecurity:    Worry: Not on file    Inability: Not on file  . Transportation needs:    Medical: Not on file    Non-medical: Not on file  Tobacco Use  . Smoking status: Never Smoker  . Smokeless tobacco: Never Used  Substance and Sexual Activity  . Alcohol use: Yes    Comment: Occasionally.  . Drug use: Not on file  . Sexual activity: Not on file  Lifestyle  . Physical activity:    Days per week: Not on file    Minutes per session: Not on file  . Stress: Not on file  Relationships  . Social connections:    Talks on phone: Not on file    Gets together: Not on file    Attends religious service: Not on file    Active member of club or organization: Not on file    Attends meetings of clubs or organizations: Not on file    Relationship status: Not on file  . Intimate partner violence:    Fear of current or ex partner: Not on file    Emotionally abused: Not on file    Physically abused: Not on file    Forced sexual activity: Not on file  Other Topics Concern  . Not on file  Social History Narrative  . Not on file    Family History:   Family History  Problem Relation Age of Onset  . Diabetes Mellitus II Mother   . Throat cancer Brother      ROS:  Please see the history of present illness.  All other ROS reviewed and negative.     Physical Exam/Data:   Vitals:   08/25/18 0800 08/25/18 0900 08/25/18 1000 08/25/18 1100  BP: 129/88 111/80 117/83 (!) 127/94  Pulse: 81 72 79   Resp: 18 17 18 17   Temp:      TempSrc:        SpO2: 98% 99% 100%   Weight:      Height:        Intake/Output Summary (Last 24 hours) at 08/25/2018 1125 Last data filed at 08/25/2018 1100 Gross per 24 hour  Intake 1527.61 ml  Output 2185 ml  Net -657.39 ml   Filed Weights   08/18/18 0431 08/19/18 0515 08/20/18 0459  Weight: 94 kg 93.8 kg 93.2 kg   Body mass index is 30.33 kg/m.  General:  Well nourished, well developed, in no acute distress HEENT: normal Lymph: no adenopathy Neck: no JVD Endocrine:  No thryomegaly Vascular: No carotid bruits Cardiac:  RRR; no murmurs, gallops or rubs Lungs:  CTA b/l, no wheezing, rhonchi or rales, he has chest wall tenderness (post CPR) Abd: soft, nontender  Ext: no edema Musculoskeletal:  No deformities Skin: warm and dry  Neuro: no gross focal abnormalities noted Psych:  Normal affect   EKG:  The EKG was personally reviewed and demonstrates:   08/22/18 ST 108bpm, QRS 108ms Telemetry:  Telemetry was personally reviewed and demonstrates:   SR, rare PVCs, I do not see any VT since his arrest  Relevant CV Studies:  08/20/18: TTE Study Conclusions - Left ventricle: Systolic function was severely reduced. The   estimated ejection fraction was in the range of 25% to 30%.   Dyskinesis and scarring of the apical myocardium. Akinesis of the   mid-apicalanteroseptal, anterior, and anterolateral myocardium;   consistent with infarction in the distribution of the left   anterior descending coronary artery; unchanged from the study of   08/18/2018. Doppler parameters are consistent with restrictive   physiology, indicative of decreased left ventricular diastolic   compliance and/or increased left atrial pressure. No evidence of   thrombus. - Mitral valve: Calcified annulus. There was severe regurgitation   directed centrally. The acceleration rate of the regurgitant jet   was reduced, consistent with a low dP/dt. Severe regurgitation is   suggested by pulmonary vein systolic flow  reversal. - Left atrium: The atrium was severely dilated. - Tricuspid valve: There was mild-moderate regurgitation directed   centrally. - Pulmonary arteries: Systolic pressure was mildly increased. PA   peak pressure: 43 mm Hg (S). Impressions: - Compared to the previous study images, the large LV apical   thrombus is no longer present. Wall motion and EF are unchanged,   but the mitral insufficiency appears worse.   08/19/18: LHC   Severe multivessel CAD including left main.  Distal left main 85%.  Diffusely diseased and totally occluded LAD in the mid segment.  Proximal 80 to 99% obstruction.  Diagonals are totally occluded.  80% ostial circumflex, 90% proximal first obtuse marginal, and total occlusion of the circumflex beyond the first marginal.  Diffuse disease in the first marginal.  Dominant right coronary with segmental 70% distal stenosis, 85% distal RCA beyond the PDA but before the first large left ventricular branch.  Diffuse disease in the proximal to midportion of the large right ventricular branch and the second posterolateral branch.  Left ventriculography and hemodynamic recordings were not performed because of LV apical thrombus.  Recommendations:   T CTS evaluation for surgical revascularization.  Guideline directed therapy for systolic heart failure: SGLT2 therapy for diabetes, resume ARB but preferably Entresto for decreased LV function, beta-blocker therapy as tolerated, and diuresis as needed for symptom control.  Monitor kidney function closely.  Anticoagulation for acute coronary syndrome and LV thrombus  Laboratory Data:  Chemistry Recent Labs  Lab 08/23/18 0853 08/24/18 0447 08/25/18 0358  NA 139 139 135  K 3.9 3.6 3.2*  CL 102 101 99  CO2 26 24 27   GLUCOSE 228* 157* 144*  BUN 55* 60* 58*  CREATININE 3.56* 3.38* 2.93*  CALCIUM 8.4* 8.2* 8.2*  GFRNONAA 17* 18* 21*  GFRAA 19* 21* 25*  ANIONGAP 11 14 9     Recent Labs  Lab  08/20/18 1056 08/21/18 0340 08/21/18 0520 08/23/18 0853 08/24/18 0447 08/25/18 0358  PROT 5.5* SPECIMEN HEMOLYZED. HEMOLYSIS MAY AFFECT INTEGRITY OF RESULTS. 5.5*  --   --   --  ALBUMIN 2.8* SPECIMEN HEMOLYZED. HEMOLYSIS MAY AFFECT INTEGRITY OF RESULTS. 2.8* 2.9* 2.7* 2.7*  AST 81* SPECIMEN HEMOLYZED. HEMOLYSIS MAY AFFECT INTEGRITY OF RESULTS. 91*  --   --   --   ALT 55* SPECIMEN HEMOLYZED. HEMOLYSIS MAY AFFECT INTEGRITY OF RESULTS. 63*  --   --   --   ALKPHOS 43 SPECIMEN HEMOLYZED. HEMOLYSIS MAY AFFECT INTEGRITY OF RESULTS. 41  --   --   --   BILITOT 1.5* SPECIMEN HEMOLYZED. HEMOLYSIS MAY AFFECT INTEGRITY OF RESULTS. 1.4*  --   --   --    Hematology Recent Labs  Lab 08/23/18 0506 08/24/18 0447 08/25/18 0358  WBC 14.8* 7.3 8.4  RBC 4.04* 4.02* 3.96*  HGB 11.5* 11.3* 11.3*  HCT 36.2* 35.8* 34.8*  MCV 89.6 89.1 87.9  MCH 28.5 28.1 28.5  MCHC 31.8 31.6 32.5  RDW 14.7 14.6 14.2  PLT 182 180 179   Cardiac Enzymes Recent Labs  Lab 08/20/18 1056 08/20/18 1716 08/20/18 2324  TROPONINI 0.75* 1.74* 5.76*   No results for input(s): TROPIPOC in the last 168 hours.  BNP Recent Labs  Lab 08/20/18 1258  BNP 1,184.0*    DDimer No results for input(s): DDIMER in the last 168 hours.  Radiology/Studies:   Dg Chest Port 1 View Result Date: 08/22/2018 CLINICAL DATA:  Acute respiratory failure. EXAM: PORTABLE CHEST 1 VIEW COMPARISON:  Radiograph of August 21, 2018. FINDINGS: Stable cardiomegaly and central pulmonary vascular congestion is noted. Endotracheal nasogastric tubes have been removed. Left internal jugular catheter is noted with tip in expected position of the SVC. Mildly increased bilateral perihilar and basilar interstitial densities are noted concerning for pulmonary edema and associated pleural effusions. No pneumothorax is noted. Bony thorax is unremarkable. IMPRESSION: Mildly increased bilateral perihilar and basilar interstitial densities are noted concerning for  pulmonary edema and associated pleural effusions. Electronically Signed   By: Lupita Raider, M.D.   On: 08/22/2018 12:15    Ct Head Code Stroke Wo Contrast Result Date: 08/22/2018 CLINICAL DATA:  Code stroke. 62 y/o M; aphasia, vision loss, altered mental status, dysarthria. EXAM: CT HEAD WITHOUT CONTRAST TECHNIQUE: Contiguous axial images were obtained from the base of the skull through the vertex without intravenous contrast. COMPARISON:  None. FINDINGS: Brain: Hypodensity in the right inferior cerebellum may represent an acute or subacute infarction (series 5, image 53). No additional area of acute infarction, hemorrhage, or mass effect identified. No extra-axial collection, hydrocephalus, or herniation. Small chronic infarct within the left putamen extending into corona radiata and caudate body. Mild-to-moderate chronic microvascular ischemic changes and volume loss of the brain. Vascular: Calcific atherosclerosis of the carotid siphons. No hyperdense vessel identified. Skull: Normal. Negative for fracture or focal lesion. Sinuses/Orbits: No acute finding. Other: Chronic left lamina papyracea fracture with medial herniation of extraconal fat. ASPECTS [facility] Stroke Program Early CT Score) - Ganglionic level infarction (caudate, lentiform nuclei, internal capsule, insula, M1-M3 cortex): 7 - Supraganglionic infarction (M4-M6 cortex): 3 Total score (0-10 with 10 being normal): 10 IMPRESSION: 1. Small hypodensity within the right inferior cerebellum which may represent an acute or subacute infarction. 2. No additional acute stroke, hemorrhage, or mass effect identified. 3. Mild-to-moderate chronic microvascular ischemic changes and volume loss of the brain for age. 4. 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 5. ASPECTS is 10 These results were called by telephone at the time of interpretation on 08/22/2018 at 3:03 am to Dr. Amada Jupiter, who verbally  acknowledged these  results. Electronically Signed   By: Mitzi Hansen M.D.   On: 08/22/2018 03:06    Assessment and Plan:   1. NSTEMI 2. Severe CAD, not revascularizable 3. S/p cardiac arrest 4. AKI 5. ICM 6. LV thrombus >> gone post arrest     Not felt to have suffered CVA by neuro notes     Planned for lifelong a/c with risk of recurrance given WMA, mid-apicalanteroseptal, anterior, and anterolateral myocardium;   consistent with infarction   He is s/p VT arrest, has significant CAD not able to be revascularized.  He is on amiodarone, without recurrent VT to date.   ICD implant for secondary prevention I think is reasonable given ability to quite his VT with amiodarone  Dr. Ladona Ridgel will see/review case later today    For questions or updates, please contact [facility] Please consult [email or web address] for contact info under Cardiology/STEMI.   Signed, Sheilah Pigeon, PA-C  08/25/2018 11:25 AM   EP attending  Patient seen and examined.  Agree with the findings as noted above.  The patient is a very pleasant but very complicated 62 year old man with advanced coronary artery disease, severe left ventricular dysfunction, status post ventricular tachycardia cardiac arrest.  He was evaluated for bypass surgery and revealed to be too high risk.  Patient has developed acute on chronic renal failure with his arrest though his creatinine has slowly improved.  He does not have anginal symptoms.  I have personally reviewed his cardiac catheterization.  The findings are fairly grim.  He has very extensive left main and proximal LAD disease as well as proximal circumflex disease as well as distal right coronary disease.  The patient currently is free of angina and his dyspnea is improving.  His exam demonstrates a pleasant 62 year old man in no acute distress.  Cardiovascular exam reveals a regular rate and rhythm with a soft S4.  His lungs reveal rales in the bases but no increased work  of breathing.  Abdominal exam is soft and his extremities demonstrate only a trace of peripheral edema.  Neurologic exam is improving although he has some very mild residual memory deficits.  EKG demonstrates sinus rhythm with old anterior MI pattern.  His echo has been reviewed.  The patient has a clear-cut indication for ICD insertion.  However his extensive coronary disease makes one pause as we may only be changing his mode of death.  I will review the cath films and discussed with partners.  In addition we will follow his kidney function.  Lewayne Bunting, MD

## 2018-08-25 NOTE — Progress Notes (Signed)
Physical Therapy Treatment Patient Details Name: Jose Myers MRN: [id number] DOB: [birth year] Today's Date: 08/25/2018    History of Present Illness 62 y.o. male admitted 08/15/18 with NSTEMI, presenting with systolic CHF and acute renal insufficiency.  EF 25-30% with LV thrombus, not a surgical candidate. On 8/23, he went into v-tach arrest, CPR, epi x2,  defibrillated x1. Code stroke called on 8/24 for speech difficulty and confusion, MRI negative, CT showed possible cerebellum infarct, which neurology believes may be artifact.     PT Comments    Pt much improved both cognitively and from mobility stand point. Pt able to tolerate ambulation without AD and significant increase in distance. Pt remains to have difficulty sequencing and with memory and cont to be a increased falls risk. Acute PT to cont to follow.   Follow Up Recommendations  Outpatient PT;Supervision for mobility/OOB     Equipment Recommendations  None recommended by PT    Recommendations for Other Services OT consult     Precautions / Restrictions Precautions Precautions: Fall Restrictions Weight Bearing Restrictions: No    Mobility  Bed Mobility Overal bed mobility: Needs Assistance Bed Mobility: Supine to Sit     Supine to sit: Supervision     General bed mobility comments: pt slightly impulsive, verbal cues for safety  Transfers Overall transfer level: Needs assistance Equipment used: 1 person hand held assist Transfers: Sit to/from Stand Sit to Stand: Min guard         General transfer comment: pt unsteady upon initial stand requiring min guard  Ambulation/Gait Ambulation/Gait assistance: Min assist;Min guard Gait Distance (Feet): 740 Feet Assistive device: 1 person hand held assist;None Gait Pattern/deviations: Step-through pattern;Decreased stride length Gait velocity: average Gait velocity interpretation: 1.31 - 2.62 ft/sec, indicative of limited community ambulator General Gait  Details: completed 1 lap around unit with R HHA and then another lap without HHA or AD. Pt required no standing rest breaks, pt mildly unsteady but with no overt episode of LOB   Stairs             Wheelchair Mobility    Modified Rankin (Stroke Patients Only)       Balance Overall balance assessment: Needs assistance Sitting-balance support: No upper extremity supported;Feet supported Sitting balance-Leahy Scale: Good Sitting balance - Comments: assist for line safety      Standing balance-Leahy Scale: Good                              Cognition Arousal/Alertness: Awake/alert Behavior During Therapy: Impulsive Overall Cognitive Status: Impaired/Different from baseline Area of Impairment: Attention;Following commands;Safety/judgement;Awareness;Problem solving                   Current Attention Level: Selective   Following Commands: Follows multi-step commands inconsistently;Follows one step commands with increased time Safety/Judgement: Decreased awareness of safety;Decreased awareness of deficits Awareness: Emergent Problem Solving: Slow processing;Difficulty sequencing;Requires verbal cues;Requires tactile cues General Comments: pt with difficulty following simple commands ie. scoot back in chair, pt scooted forward      Exercises      General Comments General comments (skin integrity, edema, etc.): SpO2 >87% on 6LO2 via [place]      Pertinent Vitals/Pain Pain Assessment: 0-10 Pain Score: 5  Pain Location: back Pain Descriptors / Indicators: Sore Pain Intervention(s): Monitored during session    Home Living  Prior Function            PT Goals (current goals can now be found in the care plan section) Acute Rehab PT Goals Patient Stated Goal: walk Progress towards PT goals: Progressing toward goals    Frequency    Min 3X/week      PT Plan Current plan remains appropriate    Co-evaluation               AM-PAC PT "6 Clicks" Daily Activity  Outcome Measure  Difficulty turning over in bed (including adjusting bedclothes, sheets and blankets)?: None Difficulty moving from lying on back to sitting on the side of the bed? : None Difficulty sitting down on and standing up from a chair with arms (e.g., wheelchair, bedside commode, etc,.)?: A Little Help needed moving to and from a bed to chair (including a wheelchair)?: A Little Help needed walking in hospital room?: A Little Help needed climbing 3-5 steps with a railing? : A Little 6 Click Score: 20    End of Session Equipment Utilized During Treatment: Gait belt;Oxygen Activity Tolerance: Patient tolerated treatment well;Patient limited by fatigue Patient left: in chair;with chair alarm set;with call bell/phone within reach;with family/visitor present Nurse Communication: Mobility status PT Visit Diagnosis: Unsteadiness on feet (R26.81)     Time: 3664-4034 PT Time Calculation (min) (ACUTE ONLY): 31 min  Charges:  $Gait Training: 23-37 mins                     Lewis Shock, PT, DPT Pager #: [phone] Office #: [phone]    Jose Myers 08/25/2018, 2:43 PM

## 2018-08-25 NOTE — Plan of Care (Addendum)
Pt currently on BSC. Vital signs stable. Family was at bedside this shift, left this pm. CP noted when pt takes deep breaths, rib pop sound heard upon auscultation. Possible rib fx maybe? Pt to go for a x-ray this PM. PRN pain meds given as ordered.  Pt walked with PT this shift, ambulated 740 ft. Foley out this shift. Pt has also had some n/v this shift. PRN meds given as ordered. No other complaints at this time. Will continue to monitor.   Problem: Health Behavior/Discharge Planning: Goal: Ability to manage health-related needs will improve Outcome: Progressing   Problem: Clinical Measurements: Goal: Ability to maintain clinical measurements within normal limits will improve Outcome: Progressing Goal: Diagnostic test results will improve Outcome: Progressing Goal: Cardiovascular complication will be avoided Outcome: Progressing   Problem: Skin Integrity: Goal: Risk for impaired skin integrity will decrease Outcome: Progressing   Problem: Education: Goal: Ability to demonstrate management of disease process will improve Outcome: Progressing Goal: Ability to verbalize understanding of medication therapies will improve Outcome: Progressing Goal: Individualized Educational Video(s) Outcome: Progressing   Problem: Activity: Goal: Capacity to carry out activities will improve Outcome: Progressing   Problem: Cardiac: Goal: Ability to achieve and maintain adequate cardiopulmonary perfusion will improve Outcome: Progressing   Problem: Education: Goal: Understanding of CV disease, CV risk reduction, and recovery process will improve Outcome: Progressing Goal: Individualized Educational Video(s) Outcome: Progressing   Problem: Activity: Goal: Ability to return to baseline activity level will improve Outcome: Progressing   Problem: Cardiovascular: Goal: Ability to achieve and maintain adequate cardiovascular perfusion will improve Outcome: Progressing Goal: Vascular access  site(s) Level 0-1 will be maintained Outcome: Progressing

## 2018-08-25 NOTE — Progress Notes (Addendum)
Patient ID: Hayden Rasmussenommy L Fullington, male   DOB: March 26, 1956, 62 y.o.   MRN: [id number] [facility] Progress Note   Assessment/ Plan:   1.  Acute kidney injury: Likely secondary to ATN following cardiac arrest + contrast nephropathy.  Improving urine output noted in response to furosemide overnight with creatinine now trending down indicative of renal recovery.  No indications for hemodialysis at this time.  He is hypervolemic on exam but without any significant respiratory symptoms. 2.  Status post ventricular tachycardia arrest: Without acute arrhythmias overnight-remains on amiodarone and heparin drip. 3.  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  Percutaneous intervention to be delayed in order not to worsen renal function. 4.  Ischemic cardiomyopathy with features of CHF exacerbation: Better response to furosemide overnight-oxygenating well on supplemental oxygen via nasal cannula.  Subjective:   Reports to be feeling better and denies any complaints.   Objective:   BP 114/80   Pulse 77   Temp 97.7 F (36.5 C) (Oral)   Resp 16   Ht 5\' 9"  (1.753 m)   Wt 93.2 kg Comment: scale a  SpO2 97%   BMI 30.33 kg/m   Intake/Output Summary (Last 24 hours) at 08/25/2018 0758 Last data filed at 08/25/2018 0600 Gross per 24 hour  Intake 1604.87 ml  Output 2160 ml  Net -555.13 ml   Weight change:   Physical Exam: Gen: Comfortably sitting up in bed, eating breakfast CVS: Pulse regular rhythm, normal rate, S1 and S2 normal Resp: Diminished breath sounds over bases without distinct rales or rhonchi Abd: Soft, obese, nontender Ext: 1+ pretibial/lower extremity edema  Imaging: No results found.  Labs: BMET Recent Labs  Lab 08/20/18 0257 08/20/18 1056  08/20/18 1329 08/21/18 0520 08/22/18 0337 08/23/18 0853 08/24/18 0447 08/25/18 0358  NA 137 138  --  138 139 139 139 135  K 3.6 4.7  --  5.4* 4.2 3.9 3.6 3.2*  CL 106 105  --  106 103 102 101 99  CO2 23 14*  --  21* 21* 26 24 27   GLUCOSE 236* 374*  --  224* 251* 228* 157* 144*  BUN 20 21  --  30* 41* 55* 60* 58*  CREATININE 1.61* 2.01*  --  2.56* 3.47* 3.56* 3.38* 2.93*  CALCIUM 8.4* 8.0*  --  7.8* 8.2* 8.4* 8.2* 8.2*  PHOS  --   --  5.6* 5.1*  --  4.8* 4.7* 4.2   CBC Recent Labs  Lab 08/22/18 0337 08/23/18 0506 08/24/18 0447 08/25/18 0358  WBC 17.2* 14.8* 7.3 8.4  HGB 12.2* 11.5* 11.3* 11.3*  HCT 38.9* 36.2* 35.8* 34.8*  MCV 89.6 89.6 89.1 87.9  PLT 225 182 180 179    Medications:    . amiodarone  400 mg Oral BID  . aspirin EC  81 mg Oral Daily  . atorvastatin  40 mg Oral q1800  . insulin aspart  0-15 Units Subcutaneous TID WC  . insulin aspart  0-5 Units Subcutaneous QHS  . insulin glargine  10 Units Subcutaneous Daily  . mouth rinse  15 mL Mouth Rinse BID  . omega-3 acid ethyl esters  1 g Oral QHS  . sertraline  25 mg Oral Daily  . sodium chloride flush  3 mL Intravenous Q12H  . Warfarin - Pharmacist Dosing Inpatient  Does not apply q1800    Zetta Bills, MD 08/25/2018, 7:58 AM

## 2018-08-25 NOTE — Progress Notes (Signed)
ANTICOAGULATION CONSULT NOTE   Pharmacy Consult:  Heparin Indication: chest pain/ACS/LV apical thrombus / acute CVA possible  No Known Allergies  Patient Measurements: Height: 5\' 9"  (175.3 cm) Weight: 205 lb 6.4 oz (93.2 kg)(scale a) IBW/kg (Calculated) : 70.7 Heparin Dosing Weight: 90 kg  Vital Signs: Temp: 97.9 F (36.6 C) (08/27 2331) Temp Source: Oral (08/27 2331) BP: 115/81 (08/28 0600) Pulse Rate: 76 (08/28 0600)  Labs: Recent Labs    08/23/18 0506  08/23/18 0853 08/23/18 2001 08/24/18 0313 08/24/18 0447 08/25/18 0358  HGB 11.5*  --   --   --   --  11.3* 11.3*  HCT 36.2*  --   --   --   --  35.8* 34.8*  PLT 182  --   --   --   --  180 179  LABPROT  --   --   --   --   --   --  16.1*  INR  --   --   --   --   --   --  1.30  HEPARINUNFRC  --    < >  --  0.31 0.32  --  0.27*  CREATININE  --   --  3.56*  --   --  3.38* 2.93*   < > = values in this interval not displayed.    Estimated Creatinine Clearance: 28.7 mL/min (A) (by C-G formula based on SCr of 2.93 mg/dL (H)).  Assessment: [age] YOM presents with 9 days of abdominal pain and increasing distension. Pt is s/p cath on 8/22 which showed multivessel CAD but not surgical candidate  - discussing PCI.  s/p  ECHO showed LV apical thrombus> started on heparin drip - follow up ECHO post VT arrest did not show thrombus but plan to continue anticoagulation. Code Stroke called 8/24. CT head negative for any acute findings. Small chronic infarction noted.  Heparin level is slightly subtherapeutic at 0.25 on 1350 units/hr. No bleeding reported by RN. Hgb 11.5  8/28 AM update: heparin level just below goal this AM, no issues per RN, Hgb stable  Goal of Therapy:  Heparin level 0.3-0.5 units/ml Monitor platelets by anticoagulation protocol: Yes   Plan:  Inc heparin to 1550 units/hr 1400 HL  Abran DukeJames Dorie Ohms, PharmD, BCPS Clinical Pharmacist Phone: [phone]

## 2018-08-26 LAB — CBC
HCT: 39 % (ref 39.0–52.0)
Hemoglobin: 12.6 g/dL — ABNORMAL LOW (ref 13.0–17.0)
MCH: 28.1 pg (ref 26.0–34.0)
MCHC: 32.3 g/dL (ref 30.0–36.0)
MCV: 86.9 fL (ref 78.0–100.0)
PLATELETS: 233 10*3/uL (ref 150–400)
RBC: 4.49 MIL/uL (ref 4.22–5.81)
RDW: 14.4 % (ref 11.5–15.5)
WBC: 10.3 10*3/uL (ref 4.0–10.5)

## 2018-08-26 LAB — RENAL FUNCTION PANEL
ALBUMIN: 3 g/dL — AB (ref 3.5–5.0)
Anion gap: 11 (ref 5–15)
BUN: 55 mg/dL — AB (ref 8–23)
CHLORIDE: 98 mmol/L (ref 98–111)
CO2: 28 mmol/L (ref 22–32)
CREATININE: 2.78 mg/dL — AB (ref 0.61–1.24)
Calcium: 8.8 mg/dL — ABNORMAL LOW (ref 8.9–10.3)
GFR calc Af Amer: 26 mL/min — ABNORMAL LOW (ref >60)
GFR, EST NON AFRICAN AMERICAN: 23 mL/min — AB (ref >60)
Glucose, Bld: 119 mg/dL — ABNORMAL HIGH (ref 70–99)
Phosphorus: 4 mg/dL (ref 2.5–4.6)
Potassium: 3.6 mmol/L (ref 3.5–5.1)
Sodium: 137 mmol/L (ref 135–145)

## 2018-08-26 LAB — GLUCOSE, CAPILLARY
GLUCOSE-CAPILLARY: 163 mg/dL — AB (ref 70–99)
GLUCOSE-CAPILLARY: 205 mg/dL — AB (ref 70–99)
Glucose-Capillary: 125 mg/dL — ABNORMAL HIGH (ref 70–99)
Glucose-Capillary: 148 mg/dL — ABNORMAL HIGH (ref 70–99)

## 2018-08-26 LAB — HEPARIN LEVEL (UNFRACTIONATED)
Heparin Unfractionated: 0.55 IU/mL (ref 0.30–0.70)
Heparin Unfractionated: 0.45 IU/mL (ref 0.30–0.70)

## 2018-08-26 LAB — PROTIME-INR
INR: 1.49
Prothrombin Time: 17.9 seconds — ABNORMAL HIGH (ref 11.4–15.2)

## 2018-08-26 MED ORDER — PROMETHAZINE HCL 25 MG PO TABS
25.0000 mg | ORAL_TABLET | Freq: Four times a day (QID) | ORAL | Status: DC | PRN
  Administered 2018-08-30: 25 mg via ORAL
  Filled 2018-08-26 (×3): qty 1

## 2018-08-26 MED ORDER — POLYETHYLENE GLYCOL 3350 17 G PO PACK
17.0000 g | PACK | Freq: Every day | ORAL | Status: DC | PRN
  Administered 2018-08-26 – 2018-09-02 (×3): 17 g via ORAL
  Filled 2018-08-26 (×3): qty 1

## 2018-08-26 MED ORDER — ENSURE ENLIVE PO LIQD
237.0000 mL | Freq: Two times a day (BID) | ORAL | Status: DC
  Administered 2018-08-27 – 2018-08-30 (×2): 237 mL via ORAL

## 2018-08-26 MED ORDER — WARFARIN SODIUM 5 MG PO TABS: 5.0000 mg | ORAL_TABLET | Freq: Once | ORAL | Status: DC

## 2018-08-26 MED ORDER — METOCLOPRAMIDE HCL 5 MG/ML IJ SOLN
10.0000 mg | Freq: Once | INTRAMUSCULAR | Status: AC
  Administered 2018-08-26: 10 mg via INTRAVENOUS
  Filled 2018-08-26: qty 2

## 2018-08-26 MED ORDER — FUROSEMIDE 10 MG/ML IJ SOLN
120.0000 mg | Freq: Two times a day (BID) | INTRAVENOUS | Status: DC
  Administered 2018-08-26 – 2018-08-30 (×8): 120 mg via INTRAVENOUS
  Filled 2018-08-26 (×2): qty 10
  Filled 2018-08-26: qty 2
  Filled 2018-08-26: qty 12
  Filled 2018-08-26 (×2): qty 10
  Filled 2018-08-26: qty 12
  Filled 2018-08-26: qty 10
  Filled 2018-08-26: qty 12
  Filled 2018-08-26: qty 10

## 2018-08-26 NOTE — Progress Notes (Addendum)
EP will follow from afar, revisit case once discharge timing is closer.  Francis Dowseenee Ursuy, PA-C  EP Attending  Agree with above.   Leonia ReevesGregg Montserrat Shek,M.D.

## 2018-08-26 NOTE — Progress Notes (Signed)
ANTICOAGULATION CONSULT NOTE   Pharmacy Consult:  Heparin, warfarin Indication: chest pain/ACS/LV apical thrombus  No Known Allergies  Patient Measurements: Height: 5\' 9"  (175.3 cm) Weight: 212 lb 1.3 oz (96.2 kg) IBW/kg (Calculated) : 70.7 Heparin Dosing Weight: 90 kg  Vital Signs: Temp: 97.7 F (36.5 C) (08/29 0755) Temp Source: Oral (08/29 0755) BP: 130/92 (08/29 0900) Pulse Rate: 80 (08/29 0900)  Labs: Recent Labs    08/24/18 0447 08/25/18 0358  08/25/18 2001 08/26/18 0235 08/26/18 0823  HGB 11.3* 11.3*  --   --  12.6*  --   HCT 35.8* 34.8*  --   --  39.0  --   PLT 180 179  --   --  233  --   LABPROT  --  16.1*  --   --  17.9*  --   INR  --  1.30  --   --  1.49  --   HEPARINUNFRC  --  0.27*   < > 0.56 0.55 0.45  CREATININE 3.38* 2.93*  --   --  2.78*  --    < > = values in this interval not displayed.    Estimated Creatinine Clearance: 30.7 mL/min (A) (by C-G formula based on SCr of 2.78 mg/dL (H)).  Assessment: [age] YOM presents with 9 days of abdominal pain and increasing distension. Pt is s/p cath on 8/22 which showed multivessel CAD but not surgical/PCI candidate - continuing with medical management.  s/p  ECHO showed LV apical thrombus> started on heparin drip - follow up ECHO post VT arrest did not show thrombus but plan to continue anticoagulation. Code Stroke called 8/24. CT head negative for any acute findings. Small chronic infarction noted.  Patient was not on anticoagulation PTA.  Heparin level is therapeutic at 0.45 on 1600 units/hr. No bleeding reported by RN. Hgb 12.6  INR today is 1.49  Goal of Therapy:  Heparin level 0.3-0.5 units/ml Monitor platelets by anticoagulation protocol: Yes  INR 2-3   Plan:  Warfarin 5 mg x 1 tonight Continue heparin drip at 1600 units/hr Continue heparin drip until INR > 1.8 Daily INR, heparin level, and CBC  Danae OrleansMegan Codee Bloodworth, PharmD PGY1 Pharmacy Resident Phone [phone] 08/26/2018       9:38 AM

## 2018-08-26 NOTE — Plan of Care (Signed)
Pt currently in chair. Family at bedside. Pt has had episodes of nausea and vomiting this shift. PRN Zofran given as well as a one time dose of Reglan. Pt also given PRN miralax as well to promote a Bm. Pt has been complaining of constipation. Transfer orders placed by MD, awaiting bed availability. Vital signs stable throughout shift. PRN pain meds given as ordered. Heparin gtt infusing, no signs of bleeding. No new complaints at this time. No new changes at this time. Will continue to monitor.    Problem: Health Behavior/Discharge Planning: Goal: Ability to manage health-related needs will improve Outcome: Progressing   Problem: Clinical Measurements: Goal: Ability to maintain clinical measurements within normal limits will improve Outcome: Progressing Goal: Diagnostic test results will improve Outcome: Progressing Goal: Cardiovascular complication will be avoided Outcome: Progressing   Problem: Skin Integrity: Goal: Risk for impaired skin integrity will decrease Outcome: Progressing   Problem: Education: Goal: Ability to demonstrate management of disease process will improve Outcome: Progressing Goal: Ability to verbalize understanding of medication therapies will improve Outcome: Progressing Goal: Individualized Educational Video(s) Outcome: Progressing   Problem: Activity: Goal: Capacity to carry out activities will improve Outcome: Progressing   Problem: Cardiac: Goal: Ability to achieve and maintain adequate cardiopulmonary perfusion will improve Outcome: Progressing   Problem: Education: Goal: Understanding of CV disease, CV risk reduction, and recovery process will improve Outcome: Progressing Goal: Individualized Educational Video(s) Outcome: Progressing   Problem: Activity: Goal: Ability to return to baseline activity level will improve Outcome: Progressing   Problem: Cardiovascular: Goal: Ability to achieve and maintain adequate cardiovascular perfusion will  improve Outcome: Progressing Goal: Vascular access site(s) Level 0-1 will be maintained Outcome: Progressing

## 2018-08-26 NOTE — Progress Notes (Signed)
Progress Note  Patient Name: Jose Myers Date of Encounter: 08/26/2018  Primary Cardiologist: NEw  Subjective   Breathing is OK  CP with breathing  Inpatient Medications    Scheduled Meds: . amiodarone  400 mg Oral BID  . aspirin EC  81 mg Oral Daily  . atorvastatin  40 mg Oral q1800  . Chlorhexidine Gluconate Cloth  6 each Topical Daily  . insulin aspart  0-15 Units Subcutaneous TID WC  . insulin aspart  0-5 Units Subcutaneous QHS  . insulin glargine  10 Units Subcutaneous Daily  . mouth rinse  15 mL Mouth Rinse BID  . omega-3 acid ethyl esters  1 g Oral QHS  . sertraline  25 mg Oral Daily  . sodium chloride flush  3 mL Intravenous Q12H  . Warfarin - Pharmacist Dosing Inpatient   Does not apply q1800   Continuous Infusions: . sodium chloride 10 mL/hr at 08/26/18 0500  . furosemide 120 mg (08/26/18 0640)  . heparin 1,600 Units/hr (08/26/18 0500)   PRN Meds: sodium chloride, acetaminophen **OR** acetaminophen, acetaminophen-codeine, docusate, fentaNYL, LORazepam, ondansetron (ZOFRAN) IV, RESOURCE THICKENUP CLEAR, sodium chloride flush, sodium chloride flush, traZODone   Vital Signs    Vitals:   08/26/18 0300 08/26/18 0358 08/26/18 0400 08/26/18 0600  BP: (!) 126/91  (!) 134/97   Pulse: 76  98   Resp: 16  (!) 21   Temp:  98 F (36.7 C)    TempSrc:  Oral    SpO2: 100%  97%   Weight:    96.2 kg  Height:        Intake/Output Summary (Last 24 hours) at 08/26/2018 0642 Last data filed at 08/26/2018 0500 Gross per 24 hour  Intake 981.37 ml  Output 2402 ml  Net -1420.63 ml   Filed Weights   08/19/18 0515 08/20/18 0459 08/26/18 0600  Weight: 93.8 kg 93.2 kg 96.2 kg    Telemetry   SR- personally reviewed  ECG     Physical Exam  Awake, alert GEN: No acute distress.   Neck: JVP is elevated   Cardiac: RRR with occasional ectopy, no murmurs, rubs, or gallops.      Respiratory: Clear to auscultation bilaterally. Decreased BS at bases GI: Soft,  nontender, non-distended  MS: 1+ edema; No deformity. Neuro:  Nonfocal  Psych: Normal affect   Labs    Chemistry Recent Labs  Lab 08/20/18 1056 08/21/18 0340 08/21/18 0520  08/24/18 0447 08/25/18 0358 08/26/18 0235  NA 138  --  138   < > 139 135 137  K 4.7  --  5.4*   < > 3.6 3.2* 3.6  CL 105  --  106   < > 101 99 98  CO2 14*  --  21*   < > 24 27 28   GLUCOSE 374*  --  224*   < > 157* 144* 119*  BUN 21  --  30*   < > 60* 58* 55*  CREATININE 2.01*  --  2.56*   < > 3.38* 2.93* 2.78*  CALCIUM 8.0*  --  7.8*   < > 8.2* 8.2* 8.8*  PROT 5.5* SPECIMEN HEMOLYZED. HEMOLYSIS MAY AFFECT INTEGRITY OF RESULTS. 5.5*  --   --   --   --   ALBUMIN 2.8* SPECIMEN HEMOLYZED. HEMOLYSIS MAY AFFECT INTEGRITY OF RESULTS. 2.8*   < > 2.7* 2.7* 3.0*  AST 81* SPECIMEN HEMOLYZED. HEMOLYSIS MAY AFFECT INTEGRITY OF RESULTS. 81*  --   --   --   --  ALT 55* SPECIMEN HEMOLYZED. HEMOLYSIS MAY AFFECT INTEGRITY OF RESULTS. 63*  --   --   --   --   ALKPHOS 43 SPECIMEN HEMOLYZED. HEMOLYSIS MAY AFFECT INTEGRITY OF RESULTS. 41  --   --   --   --   BILITOT 1.5* SPECIMEN HEMOLYZED. HEMOLYSIS MAY AFFECT INTEGRITY OF RESULTS. 1.4*  --   --   --   --   GFRNONAA 33*  --  25*   < > 18* 21* 23*  GFRAA 39*  --  29*   < > 21* 25* 26*  ANIONGAP 19*  --  11   < > 14 9 11    < > = values in this interval not displayed.     Hematology Recent Labs  Lab 08/24/18 0447 08/25/18 0358 08/26/18 0235  WBC 7.3 8.4 10.3  RBC 4.02* 3.96* 4.49  HGB 11.3* 11.3* 12.6*  HCT 35.8* 34.8* 39.0  MCV 89.1 87.9 86.9  MCH 28.1 28.5 28.1  MCHC 31.6 32.5 32.3  RDW 14.6 14.2 14.4  PLT 180 179 233    Cardiac Enzymes Recent Labs  Lab 08/20/18 1056 08/20/18 1716 08/20/18 2324  TROPONINI 0.75* 1.74* 5.76*    No results for input(s): TROPIPOC in the last 168 hours.   BNP Recent Labs  Lab 08/20/18 1258  BNP 1,184.0*     DDimer No results for input(s): DDIMER in the last 168 hours.   Radiology    Dg Chest 2 View  Result Date:  08/25/2018 CLINICAL DATA:  Right chest pain EXAM: CHEST - 2 VIEW COMPARISON:  08/22/2018 FINDINGS: Left central line is unchanged. Cardiomegaly. Moderate bilateral layering effusions and bilateral lower lobe opacities, atelectasis versus infiltrates. No acute bony abnormality. Slight improvement in aeration since prior study. IMPRESSION: Continued moderate layering effusions and bibasilar atelectasis or infiltrates with slight improvement. Electronically Signed   By: Charlett NoseKevin  Dover M.D.   On: 08/25/2018 19:40    Cardiac Studies   August 20, 2018 echo - Left ventricle: Systolic function was severely reduced. The estimated ejection fraction was in the range of 25% to 30%. Dyskinesis and scarring of the apical myocardium. Akinesis of the mid-apicalanteroseptal, anterior, and anterolateral myocardium; consistent with infarction in the distribution of the left anterior descending coronary artery; unchanged from the study of 08/18/2018. Doppler parameters are consistent with restrictive physiology, indicative of decreased left ventricular diastolic compliance and/or increased left atrial pressure. No evidence of thrombus. - Mitral valve: Calcified annulus. There was severe regurgitation directed centrally. The acceleration rate of the regurgitant jet was reduced, consistent with a low dP/dt. Severe regurgitation is suggested by pulmonary vein systolic flow reversal. - Left atrium: The atrium was severely dilated. - Tricuspid valve: There was mild-moderate regurgitation directed centrally. - Pulmonary arteries: Systolic pressure was mildly increased. PA peak pressure: 43 mm Hg (S).  Impressions:  - Compared to the previous study images, the large LV apical thrombus is no longer present. Wall motion and EF are unchanged, but the mitral insufficiency appears worse.  TTE8/21/19 Study Conclusions  - Left ventricle: The cavity size was normal. Wall thickness  was increased in a pattern of mild LVH. Systolic function was severely reduced. The estimated ejection fraction was in the range of 25% to 30%. Diffuse hypokinesis. There is akinesis of the anteroseptal, anterior, and anterolateral myocardium. There was a large, 3.3 cm (L) x 1.8 cm (W), spherical, fixed, apical anteriorthrombusassociated with an akinetic segment. - Aortic valve: Trileaflet; mildly thickened, mildly calcified leaflets. - Mitral valve:  There was moderate regurgitation. - Left atrium: The atrium was mildly dilated. - Tricuspid valve: There was mild regurgitation. - Pulmonary arteries: Systolic pressure was moderately increased. PA peak pressure: 59 mm Hg (S).  Cath 08/19/18  Severe multivessel CAD including left main. Distal left main 85%.  Diffusely diseased and totally occluded LAD in the mid segment. Proximal 80 to 99% obstruction. Diagonals are totally occluded.  80% ostial circumflex, 90% proximal first obtuse marginal, and total occlusion of the circumflex beyond the first marginal. Diffuse disease in the first marginal.  Dominant right coronary with segmental 70% distal stenosis, 85% distal RCA beyond the PDA but before the first large left ventricular branch. Diffuse disease in the proximal to midportion of the large right ventricular branch and the second posterolateral branch.  Left ventriculography and hemodynamic recordings were not performed because of LV apical thrombus.  Recommendations:   T CTS evaluation for surgical revascularization.  Guideline directed therapy for systolic heart failure: SGLT2 therapy for diabetes, resume ARB but preferably Entresto for decreased LV function, beta-blocker therapy as tolerated, and diuresis as needed for symptom control.  Monitor kidney function closely.  Anticoagulation for acute coronary syndrome and LV thrombus  Patient Profile     62 y.o. male with history of CAD and remote placement  of an LAD stent 20 years ago, diabetes mellitus, presented with findings of acute biventricular heart failure and delayed presentation of STEMI.  Left ventricular apical thrombus present.  Cardiac catheterization showed extensive multivessel CAD.  During work-up, developed monomorphic VT arrest with rapid resuscitation on August 22.  Successfully extubated August 24.  Developed acute kidney injury and urinary bladder retention.  Assessment & Plan    1.Acute systolic CHF    Pt iwht signif volume increase  Renal function is starting to improve   Needs diuresis  Pleural effusion are large   ? If thoracentesis needed if do not improve  2. CAD  Pt with severe 3V dz   Not considered a revascularization candidate by interventional cardiology or cardiac surgery   Will work with medical Rx    3  Rhythm  Pt with VT earlier admit  Now on amiodarone   Occsional PVC   EP seeing pt       4. LV thrombus:   Repeat echo this admit, apical thrombus was no longer present   ON heparin   WIll need to transition to coumadin but I would hold back until no procedures being considered  5  AKI:   Cr improving   REnal following    6  HL  Tx pt to step down    For questions or updates, please contact [facility] Please consult [email or web address] for contact info under Cardiology/STEMI.      Signed, Dietrich Pates, MD  08/26/2018, 6:42 AM

## 2018-08-26 NOTE — Progress Notes (Signed)
Patient ID: Jose Myers, male   DOB: 10/09/1956, 62 y.o.   MRN: [id number]  [facility] Progress Note   Assessment/ Plan:   1.  Acute kidney injury: Likely secondary to ATN following cardiac arrest + contrast nephropathy.  Continues to have fair urine output in response to furosemide 120 mg every 8 hours with sluggish improvement of creatinine and without any acute electrolyte abnormalities.  He is slightly hypervolemic and continues to require oxygen via nasal cannula.  There are no indications for hemodialysis at this time- will decrease furosemide to every 12 hours. 2.  Status post ventricular tachycardia arrest: Without acute arrhythmias overnight-remains on amiodarone and heparin drip. 3.  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  Percutaneous intervention to be delayed in order not to worsen renal function. 4.  Ischemic cardiomyopathy with CHF exacerbation: Maintaining fair urine output and appears to be approaching euvolemic state-decrease furosemide dose.  Subjective:   Reports that he vomited twice overnight and was not able to sleep restfully.   Objective:   BP (!) 121/92   Pulse 80   Temp 98 F (36.7 C) (Oral)   Resp 17   Ht 5\' 9"  (1.753 m)   Wt 96.2 kg   SpO2 97%   BMI 31.32 kg/m   Intake/Output Summary (Last 24 hours) at 08/26/2018 0755 Last data filed at 08/26/2018 0500 Gross per 24 hour  Intake 981.37 ml  Output 2402 ml  Net -1420.63 ml   Weight change:   Physical Exam: Gen: Sitting up on the side of his bed, appears comfortable CVS: Pulse regular rhythm, normal rate, S1 and S2 normal Resp: Clear to auscultation bilaterally, no rales/rhonchi Abd: Soft, obese, nontender Ext: 1+ pretibial/lower extremity  edema  Imaging: Dg Chest 2 View  Result Date: 08/25/2018 CLINICAL DATA:  Right chest pain EXAM: CHEST - 2 VIEW COMPARISON:  08/22/2018 FINDINGS: Left central line is unchanged. Cardiomegaly. Moderate bilateral layering effusions and bilateral lower lobe opacities, atelectasis versus infiltrates. No acute bony abnormality. Slight improvement in aeration since prior study. IMPRESSION: Continued moderate layering effusions and bibasilar atelectasis or infiltrates with slight improvement. Electronically Signed   By: Charlett Nose M.D.   On: 08/25/2018 19:40    Labs: BMET Recent Labs  Lab 08/20/18 1056 08/20/18 1329 08/21/18 0520 08/22/18 0337 08/23/18 0853 08/24/18 0447 08/25/18 0358 08/26/18 0235  NA 138  --  138 139 139 139 135 137  K 4.7  --  5.4* 4.2 3.9 3.6 3.2* 3.6  CL 105  --  106 103 102 101 99 98  CO2 14*  --  21* 21* 26 24 27 28   GLUCOSE 374*  --  224* 251* 228* 157* 144* 119*  BUN 21  --  30* 41* 55* 60* 58* 55*  CREATININE 2.01*  --  2.56* 3.47* 3.56* 3.38* 2.93* 2.78*  CALCIUM 8.0*  --  7.8* 8.2* 8.4* 8.2* 8.2* 8.8*  PHOS  --  5.6* 5.1*  --  4.8* 4.7* 4.2 4.0   CBC Recent Labs  Lab 08/23/18 0506 08/24/18 0447 08/25/18 0358 08/26/18 0235  WBC 14.8* 7.3 8.4 10.3  HGB 11.5* 11.3* 11.3* 12.6*  HCT 36.2* 35.8* 34.8* 39.0  MCV 89.6 89.1 87.9 86.9  PLT 182 180 179 233    Medications:    . amiodarone  400 mg Oral BID  . aspirin EC  81 mg Oral Daily  . atorvastatin  40 mg Oral q1800  . Chlorhexidine Gluconate Cloth  6 each Topical Daily  . insulin aspart  0-15 Units Subcutaneous TID WC  . insulin aspart  0-5 Units Subcutaneous QHS  . insulin glargine  10 Units Subcutaneous Daily  . mouth rinse  15 mL Mouth Rinse BID  . omega-3 acid ethyl esters  1 g Oral QHS  . sertraline  25 mg Oral Daily  . sodium chloride flush  3 mL Intravenous Q12H  . Warfarin - Pharmacist Dosing Inpatient   Does not apply [id number]    Zetta BillsJay Annalisse Minkoff, MD 08/26/2018, 7:55 AM

## 2018-08-26 NOTE — Progress Notes (Signed)
1322 Came to see pt to walk. Pt stated he just threw up and not feeling well. Encouraged him to walk with staff later when he is not nauseated. Will continue to follow.Luetta Nuttingharlene Carlosdaniel Grob RN BSN 08/26/2018 1:23 PM

## 2018-08-26 NOTE — Progress Notes (Signed)
Nutrition Follow-up  DOCUMENTATION CODES:   Obesity unspecified  INTERVENTION:   Ensure Enlive po BID, each supplement provides 350 kcal and 20 grams of protein  Recommend scheduled bowel regimen until pt has BM; consider abdominal xray if vomiting continues  NUTRITION DIAGNOSIS:   Inadequate oral intake related to inability to eat as evidenced by NPO status.  Continues  GOAL:   Patient will meet greater than or equal to 90% of their needs  Progressing, not met  MONITOR:   PO intake, Labs, Weight trends, Supplement acceptance  REASON FOR ASSESSMENT:   Consult Assessment of nutrition requirement/status  ASSESSMENT:   62 year old male who presented to the ED on 8/18 with abdominal pain and leg swelling. PMH significant for hypertension, CAD s/p stenting, and type 2 diabetes mellitus. Pt found to have new onset CHF and non-STEMI.   8/22 Cardiac Cath: multivessel CAD but not surgical/PCI candidate 8/23 VT/VF arrest, intubated, transferred to ICU 8/24 Extubated  Pt had emesis this AM; noted pt constipated, with no BM since 8/23, +nausea Recorded po intake 40% on average  Labs: Creatinine 2.78, BUN 55 Meds: ss novolog, lantus, lasix, phenergan prn, colace prn, reglan 1 x dose  Diet Order:   Diet Order            Diet heart healthy/carb modified Room service appropriate? Yes; Fluid consistency: Thin  Diet effective now              EDUCATION NEEDS:   No education needs have been identified at this time  Skin:  Skin Assessment: Reviewed RN Assessment  Last BM:  8/23  Height:   Ht Readings from Last 1 Encounters:  08/15/18 5' 9"  (1.753 m)    Weight:   Wt Readings from Last 1 Encounters:  08/26/18 96.2 kg    Ideal Body Weight:  72.73 kg  BMI:  Body mass index is 31.32 kg/m.  Estimated Nutritional Needs:   Kcal:  1900-2200 kcals   Protein:  95-110 g   Fluid:  >/= 1.9 L   Kerman Passey MS, RD, LDN, CNSC [phone] Pager  [phone] Weekend/On-Call Pager

## 2018-08-27 ENCOUNTER — Encounter (HOSPITAL_COMMUNITY): Payer: Self-pay | Admitting: Radiology

## 2018-08-27 ENCOUNTER — Inpatient Hospital Stay (HOSPITAL_COMMUNITY): Payer: BLUE CROSS/BLUE SHIELD

## 2018-08-27 DIAGNOSIS — I2510 Atherosclerotic heart disease of native coronary artery without angina pectoris: Secondary | ICD-10-CM

## 2018-08-27 HISTORY — PX: IR THORACENTESIS ASP PLEURAL SPACE W/IMG GUIDE: IMG5380

## 2018-08-27 LAB — GLUCOSE, CAPILLARY
GLUCOSE-CAPILLARY: 115 mg/dL — AB (ref 70–99)
GLUCOSE-CAPILLARY: 235 mg/dL — AB (ref 70–99)
Glucose-Capillary: 192 mg/dL — ABNORMAL HIGH (ref 70–99)
Glucose-Capillary: 162 mg/dL — ABNORMAL HIGH (ref 70–99)

## 2018-08-27 LAB — CBC
HEMATOCRIT: 37.8 % — AB (ref 39.0–52.0)
Hemoglobin: 12.3 g/dL — ABNORMAL LOW (ref 13.0–17.0)
MCH: 28 pg (ref 26.0–34.0)
MCHC: 32.5 g/dL (ref 30.0–36.0)
MCV: 86.1 fL (ref 78.0–100.0)
Platelets: 211 10*3/uL (ref 150–400)
RBC: 4.39 MIL/uL (ref 4.22–5.81)
RDW: 14 % (ref 11.5–15.5)
WBC: 9.9 10*3/uL (ref 4.0–10.5)

## 2018-08-27 LAB — CULTURE, BLOOD (ROUTINE X 2)
Culture: NO GROWTH
Culture: NO GROWTH
SPECIAL REQUESTS: ADEQUATE

## 2018-08-27 LAB — RENAL FUNCTION PANEL
ANION GAP: 10 (ref 5–15)
Albumin: 2.9 g/dL — ABNORMAL LOW (ref 3.5–5.0)
BUN: 47 mg/dL — ABNORMAL HIGH (ref 8–23)
CHLORIDE: 97 mmol/L — AB (ref 98–111)
CO2: 30 mmol/L (ref 22–32)
Calcium: 8.8 mg/dL — ABNORMAL LOW (ref 8.9–10.3)
Creatinine, Ser: 2.47 mg/dL — ABNORMAL HIGH (ref 0.61–1.24)
GFR calc non Af Amer: 26 mL/min — ABNORMAL LOW (ref >60)
GFR, EST AFRICAN AMERICAN: 30 mL/min — AB (ref >60)
GLUCOSE: 110 mg/dL — AB (ref 70–99)
POTASSIUM: 4.1 mmol/L (ref 3.5–5.1)
Phosphorus: 3.9 mg/dL (ref 2.5–4.6)
SODIUM: 137 mmol/L (ref 135–145)

## 2018-08-27 LAB — PROTIME-INR
INR: 2.59
Prothrombin Time: 27.5 seconds — ABNORMAL HIGH (ref 11.4–15.2)

## 2018-08-27 LAB — HEPARIN LEVEL (UNFRACTIONATED): HEPARIN UNFRACTIONATED: 0.53 [IU]/mL (ref 0.30–0.70)

## 2018-08-27 MED ORDER — LIDOCAINE HCL (PF) 1 % IJ SOLN
INTRAMUSCULAR | Status: AC | PRN
  Administered 2018-08-27: 10 mL

## 2018-08-27 MED ORDER — LIDOCAINE HCL 1 % IJ SOLN
INTRAMUSCULAR | Status: AC
  Filled 2018-08-27: qty 20

## 2018-08-27 MED ORDER — DOCUSATE SODIUM 100 MG PO CAPS
100.0000 mg | ORAL_CAPSULE | Freq: Two times a day (BID) | ORAL | Status: DC | PRN
  Administered 2018-08-27 – 2018-09-04 (×7): 100 mg via ORAL
  Filled 2018-08-27 (×7): qty 1

## 2018-08-27 NOTE — Progress Notes (Signed)
PT Cancellation Note  Patient Details Name: Jose Myers MRN: [id number] DOB: 1956/01/19   Cancelled Treatment:     Attempted to see patient x2 today, first in AM pt refusing citing he already walked this morning, asked me to come back at 3, he is now in IR. Will cont to follow.   Etta GrandchildSean Joleigh Mineau, PT, DPT Acute Rehab Services Pager: [phone]     Etta GrandchildSean Eulas Schweitzer 08/27/2018, 3:58 PM

## 2018-08-27 NOTE — Procedures (Signed)
Ultrasound-guided  therapeutic right thoracentesis performed yielding 1.4 liters of amber/blood-tinged fluid. No immediate complications. Follow-up chest x-ray pending.

## 2018-08-27 NOTE — Progress Notes (Signed)
OT Cancellation Note  Patient Details Name: Jose Myers MRN: [id number] DOB: [birth year]   Cancelled Treatment:    Reason Eval/Treat Not Completed: Patient at procedure or test/ unavailable(Pt in IR. Will continue to follow.)  Evern BioMayberry, Alekxander Isola Lynn 08/27/2018, 3:03 PM  08/27/2018 Martie RoundJulie Andrus Sharp, OTR/L Pager: [phone]

## 2018-08-27 NOTE — Progress Notes (Signed)
Patient ID: Jose Myers, male   DOB: 1956-04-26, 62 y.o.   MRN: [id number] [facility] Progress Note   Assessment/ Plan:   1.  Acute kidney injury chronic kidney disease stage III (baseline creatinine 1.6-1.8): Likely secondary to ATN following cardiac arrest + contrast nephropathy.  Renal function continues to improve with downtrending creatinine noted.  Peripheral edema gradually improving with negative fluid balance, however continues to effusions bilaterally difficult to alleviate with diuresis especially in the setting of hypovolemia.  We will continue furosemide 120 mg IV BID for the next 24 hours and transition over to oral torsemide possibly tomorrow (100 mg twice daily). 2.  Status post ventricular tachycardia arrest: Without acute arrhythmias overnight-remains on amiodarone and heparin drip. 3.  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  Percutaneous intervention to be delayed in order not to worsen renal function. 4.  Ischemic cardiomyopathy with CHF exacerbation: Net negative fluid balance with ongoing diuretic therapy and improvement of peripheral edema but persistence of pleural effusions-may indeed benefit from thoracentesis given anticipated "resistance" with hypoalbuminemia and third spacing.  Subjective:   Reports that he better overnight and did not have any nausea or vomiting.  Still having some shortness of breath with exertion.   Objective:   BP 140/89 (BP Location: Right Arm)   Pulse 84   Temp (!) 97.5 F (36.4 C) (Oral)   Resp 18   Ht 5\' 9"  (1.753 m)   Wt 94.8 kg   SpO2 96%   BMI 30.86 kg/m   Intake/Output Summary (Last 24 hours) at 08/27/2018 0758 Last data filed at 08/27/2018 0700 Gross per 24 hour  Intake 1453.91 ml   Output 2675 ml  Net -1221.09 ml   Weight change: -1.4 kg  Physical Exam: Gen: Sitting up on the side of his bed, appears comfortable-oxygen via nasal cannula CVS: Pulse regular rhythm, normal rate, S1 and S2 normal Resp: Breath sounds over bases bilaterally without any rales or rhonchi. Abd: Soft, obese, nontender Ext: 1+ pretibial/lower extremity edema  Imaging: Dg Chest 2 View  Result Date: 08/25/2018 CLINICAL DATA:  Right chest pain EXAM: CHEST - 2 VIEW COMPARISON:  08/22/2018 FINDINGS: Left central line is unchanged. Cardiomegaly. Moderate bilateral layering effusions and bilateral lower lobe opacities, atelectasis versus infiltrates. No acute bony abnormality. Slight improvement in aeration since prior study. IMPRESSION: Continued moderate layering effusions and bibasilar atelectasis or infiltrates with slight improvement. Electronically Signed   By: Charlett Nose M.D.   On: 08/25/2018 19:40    Labs: BMET Recent Labs  Lab 08/20/18 1329 08/21/18 0520 08/22/18 4098 08/23/18 0853 08/24/18 0447 08/25/18 0358 08/26/18 0235 08/27/18 0222  NA  --  138 139 139 139 135 137 137  K  --  5.4* 4.2 3.9 3.6 3.2* 3.6 4.1  CL  --  106 103 102 101 99 98 97*  CO2  --  21* 21* 26 24 27 28 30   GLUCOSE  --  224* 251* 228* 157* 144* 119* 110*  BUN  --  30* 41* 55* 60* 58* 55* 47*  CREATININE  --  2.56* 3.47* 3.56* 3.38* 2.93* 2.78* 2.47*  CALCIUM  --  7.8* 8.2* 8.4* 8.2* 8.2* 8.8* 8.8*  PHOS 5.6* 5.1*  --  4.8* 4.7* 4.2 4.0 3.9   CBC Recent Labs  Lab  08/24/18 0447 08/25/18 0358 08/26/18 0235 08/27/18 0222  WBC 7.3 8.4 10.3 9.9  HGB 11.3* 11.3* 12.6* 12.3*  HCT 35.8* 34.8* 39.0 37.8*  MCV 89.1 87.9 86.9 86.1  PLT 180 179 233 211    Medications:    . amiodarone  400 mg Oral BID  . aspirin EC  81 mg Oral Daily  . atorvastatin  40 mg Oral q1800  . Chlorhexidine Gluconate Cloth  6 each Topical Daily  . feeding supplement (ENSURE ENLIVE)  237 mL Oral BID BM  . insulin aspart   0-15 Units Subcutaneous TID WC  . insulin aspart  0-5 Units Subcutaneous QHS  . insulin glargine  10 Units Subcutaneous Daily  . mouth rinse  15 mL Mouth Rinse BID  . omega-3 acid ethyl esters  1 g Oral QHS  . sertraline  25 mg Oral Daily  . sodium chloride flush  3 mL Intravenous Q12H    Zetta BillsJay Shamecka Hocutt, MD 08/27/2018, 7:58 AM  Patient ID: Jose Rasmussenommy L Myers, male   DOB: 1956-11-13, 62 y.o.   MRN: [id number] [facility] Progress Note   Assessment/ Plan:   1.  Acute kidney injury: Likely secondary to ATN following cardiac arrest + contrast nephropathy.  Continues to have fair urine output in response to furosemide 120 mg every 8 hours with sluggish improvement of creatinine and without any acute electrolyte abnormalities.  He is slightly hypervolemic and continues to require oxygen via nasal cannula.  There are no indications for hemodialysis at this time- will decrease furosemide to every 12 hours. 2.  Status post ventricular tachycardia arrest: Without acute arrhythmias overnight-remains on amiodarone and heparin drip. 3.  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  Percutaneous intervention to be delayed in order not to worsen renal function. 4.  Ischemic cardiomyopathy with CHF exacerbation: Maintaining fair urine output and appears to be approaching euvolemic state-decrease furosemide dose.  Subjective:   Reports that he vomited twice overnight and was not able to sleep restfully.   Objective:   BP 140/89 (BP Location: Right Arm)   Pulse 84   Temp (!) 97.5 F (36.4 C) (Oral)   Resp 18   Ht 5\' 9"  (1.753 m)   Wt 94.8 kg   SpO2 96%   BMI 30.86 kg/m   Intake/Output Summary (Last 24 hours) at 08/27/2018 0758 Last data filed at 08/27/2018  0700 Gross per 24 hour  Intake 1453.91 ml  Output 2675 ml  Net -1221.09 ml   Weight change: -1.4 kg  Physical Exam: Gen: Sitting up on the side of his bed, appears comfortable CVS: Pulse regular rhythm, normal rate, S1 and S2 normal Resp: Clear to auscultation bilaterally, no rales/rhonchi Abd: Soft, obese, nontender Ext: 1+ pretibial/lower extremity edema  Imaging: Dg Chest 2 View  Result Date: 08/25/2018 CLINICAL DATA:  Right chest pain EXAM: CHEST - 2 VIEW COMPARISON:  08/22/2018 FINDINGS: Left central line is unchanged. Cardiomegaly. Moderate bilateral layering effusions and bilateral lower lobe opacities, atelectasis versus infiltrates. No acute bony abnormality. Slight improvement in aeration since prior study. IMPRESSION: Continued moderate layering effusions and bibasilar atelectasis or infiltrates with slight improvement. Electronically Signed   By: Charlett NoseKevin  Dover M.D.   On: 08/25/2018 19:40    Labs: [employer] Recent Labs  Lab 08/20/18 1329 08/21/18 0520 08/22/18 [id number] 08/23/18  5784 08/24/18 0447 08/25/18 0358 08/26/18 0235 08/27/18 0222  NA  --  138 139 139 139 135 137 137  K  --  5.4* 4.2 3.9 3.6 3.2* 3.6 4.1  CL  --  106 103 102 101 99 98 97*  CO2  --  21* 21* 26 24 27 28 30   GLUCOSE  --  224* 251* 228* 157* 144* 119* 110*  BUN  --  30* 41* 55* 60* 58* 55* 47*  CREATININE  --  2.56* 3.47* 3.56* 3.38* 2.93* 2.78* 2.47*  CALCIUM  --  7.8* 8.2* 8.4* 8.2* 8.2* 8.8* 8.8*  PHOS 5.6* 5.1*  --  4.8* 4.7* 4.2 4.0 3.9   CBC Recent Labs  Lab 08/24/18 0447 08/25/18 0358 08/26/18 0235 08/27/18 0222  WBC 7.3 8.4 10.3 9.9  HGB 11.3* 11.3* 12.6* 12.3*  HCT 35.8* 34.8* 39.0 37.8*  MCV 89.1 87.9 86.9 86.1  PLT 180 179 233 211    Medications:    . amiodarone  400 mg Oral BID  . aspirin EC  81 mg Oral Daily  . atorvastatin  40 mg Oral q1800  . Chlorhexidine Gluconate Cloth  6 each Topical Daily  . feeding supplement (ENSURE ENLIVE)  237 mL Oral BID BM  . insulin aspart   0-15 Units Subcutaneous TID WC  . insulin aspart  0-5 Units Subcutaneous QHS  . insulin glargine  10 Units Subcutaneous Daily  . mouth rinse  15 mL Mouth Rinse BID  . omega-3 acid ethyl esters  1 g Oral QHS  . sertraline  25 mg Oral Daily  . sodium chloride flush  3 mL Intravenous Q12H    Zetta Bills, MD 08/27/2018, 7:58 AM

## 2018-08-27 NOTE — Progress Notes (Signed)
Have checked on pt x2 today. Earlier he wanted to wait on walking. Now he c/o sternal to right side pain, worse with deep breath. Asking for pain meds. Sts he does not feel up to education at this time either. I gave him an MI book, HF booklet, and low sodium diets to begin reading this weekend. Wife present. Will f/u.  [phone] Ethelda ChickKristan Kannen Moxey CES, ACSM 1:59 PM 08/27/2018

## 2018-08-27 NOTE — Progress Notes (Addendum)
Progress Note  Patient Name: Jose Myers Date of Encounter: 08/27/2018  Primary Cardiologist: Jodelle RedBridgette Christopher, MD   Subjective   Pain in chest with breathing   SOB laying    85   Sitting better   Inpatient Medications    Scheduled Meds: . amiodarone  400 mg Oral BID  . aspirin EC  81 mg Oral Daily  . atorvastatin  40 mg Oral q1800  . Chlorhexidine Gluconate Cloth  6 each Topical Daily  . feeding supplement (ENSURE ENLIVE)  237 mL Oral BID BM  . insulin aspart  0-15 Units Subcutaneous TID WC  . insulin aspart  0-5 Units Subcutaneous QHS  . insulin glargine  10 Units Subcutaneous Daily  . mouth rinse  15 mL Mouth Rinse BID  . omega-3 acid ethyl esters  1 g Oral QHS  . sertraline  25 mg Oral Daily  . sodium chloride flush  3 mL Intravenous Q12H   Continuous Infusions: . sodium chloride Stopped (08/26/18 1710)  . furosemide 120 mg (08/27/18 1010)  . heparin 1,500 Units/hr (08/27/18 1102)   PRN Meds: sodium chloride, acetaminophen **OR** acetaminophen, acetaminophen-codeine, docusate, fentaNYL, LORazepam, ondansetron (ZOFRAN) IV, polyethylene glycol, promethazine, RESOURCE THICKENUP CLEAR, sodium chloride flush, sodium chloride flush, traZODone   Vital Signs    Vitals:   08/27/18 0318 08/27/18 0600 08/27/18 0754 08/27/18 1119  BP: (!) 125/91  140/89 100/72  Pulse: 65  84 93  Resp: 18  18 (!) 21  Temp: 97.7 F (36.5 C)  (!) 97.5 F (36.4 C) 97.6 F (36.4 C)  TempSrc: Oral  Oral Oral  SpO2: 98%  96% 93%  Weight:  94.8 kg    Height:        Intake/Output Summary (Last 24 hours) at 08/27/2018 1124 Last data filed at 08/27/2018 0700 Gross per 24 hour  Intake 1063.8 ml  Output 1600 ml  Net -536.2 ml   Filed Weights   08/20/18 0459 08/26/18 0600 08/27/18 0600  Weight: 93.2 kg 96.2 kg 94.8 kg    Telemetry    SR - Personally Reviewed  ECG    No new - Personally Reviewed  Physical Exam   Exam per Dr. Tenny Crawoss  Labs    Chemistry Recent Labs    Lab 08/21/18 0340 08/21/18 0520  08/25/18 0358 08/26/18 0235 08/27/18 0222  NA  --  138   < > 135 137 137  K  --  5.4*   < > 3.2* 3.6 4.1  CL  --  106   < > 99 98 97*  CO2  --  21*   < > 27 28 30   GLUCOSE  --  224*   < > 144* 119* 110*  BUN  --  30*   < > 58* 55* 47*  CREATININE  --  2.56*   < > 2.93* 2.78* 2.47*  CALCIUM  --  7.8*   < > 8.2* 8.8* 8.8*  PROT SPECIMEN HEMOLYZED. HEMOLYSIS MAY AFFECT INTEGRITY OF RESULTS. 5.5*  --   --   --   --   ALBUMIN SPECIMEN HEMOLYZED. HEMOLYSIS MAY AFFECT INTEGRITY OF RESULTS. 2.8*   < > 2.7* 3.0* 2.9*  AST SPECIMEN HEMOLYZED. HEMOLYSIS MAY AFFECT INTEGRITY OF RESULTS. 91*  --   --   --   --   ALT SPECIMEN HEMOLYZED. HEMOLYSIS MAY AFFECT INTEGRITY OF RESULTS. 63*  --   --   --   --   ALKPHOS SPECIMEN HEMOLYZED. HEMOLYSIS MAY AFFECT INTEGRITY  OF RESULTS. 41  --   --   --   --   BILITOT SPECIMEN HEMOLYZED. HEMOLYSIS MAY AFFECT INTEGRITY OF RESULTS. 1.4*  --   --   --   --   GFRNONAA  --  25*   < > 21* 23* 26*  GFRAA  --  29*   < > 25* 26* 30*  ANIONGAP  --  11   < > 9 11 10    < > = values in this interval not displayed.     Hematology Recent Labs  Lab 08/25/18 0358 08/26/18 0235 08/27/18 0222  WBC 8.4 10.3 9.9  RBC 3.96* 4.49 4.39  HGB 11.3* 12.6* 12.3*  HCT 34.8* 39.0 37.8*  MCV 87.9 86.9 86.1  MCH 28.5 28.1 28.0  MCHC 32.5 32.3 32.5  RDW 14.2 14.4 14.0  PLT 179 233 211    Cardiac Enzymes Recent Labs  Lab 08/20/18 1716 08/20/18 2324  TROPONINI 1.74* 5.76*   No results for input(s): TROPIPOC in the last 168 hours.   BNP Recent Labs  Lab 08/20/18 1258  BNP 1,184.0*     DDimer No results for input(s): DDIMER in the last 168 hours.   Radiology    Dg Chest 2 View  Result Date: 08/25/2018 CLINICAL DATA:  Right chest pain EXAM: CHEST - 2 VIEW COMPARISON:  08/22/2018 FINDINGS: Left central line is unchanged. Cardiomegaly. Moderate bilateral layering effusions and bilateral lower lobe opacities, atelectasis versus  infiltrates. No acute bony abnormality. Slight improvement in aeration since prior study. IMPRESSION: Continued moderate layering effusions and bibasilar atelectasis or infiltrates with slight improvement. Electronically Signed   By: Jose Nose M.D.   On: 08/25/2018 19:40    Cardiac Studies   TTE 08/18/18  Study Conclusions  - Left ventricle: The cavity size was normal. Wall thickness was   increased in a pattern of mild LVH. Systolic function was   severely reduced. The estimated ejection fraction was in the   range of 25% to 30%. Diffuse hypokinesis. There is akinesis of   the anteroseptal, anterior, and anterolateral myocardium. There   was a large, 3.3 cm (L) x 1.8 cm (W), spherical, fixed, apical   anteriorthrombusassociated with an akinetic segment. - Aortic valve: Trileaflet; mildly thickened, mildly calcified   leaflets. - Mitral valve: There was moderate regurgitation. - Left atrium: The atrium was mildly dilated. - Tricuspid valve: There was mild regurgitation. - Pulmonary arteries: Systolic pressure was moderately increased.   PA peak pressure: 59 mm Hg (S).  Recommendations:  This procedure has been discussed with the referring physician.  Cardiac cath  08/19/18    Severe multivessel CAD including left main.  Distal left main 85%.  Diffusely diseased and totally occluded LAD in the mid segment.  Proximal 80 to 99% obstruction.  Diagonals are totally occluded.  80% ostial circumflex, 90% proximal first obtuse marginal, and total occlusion of the circumflex beyond the first marginal.  Diffuse disease in the first marginal.  Dominant right coronary with segmental 70% distal stenosis, 85% distal RCA beyond the PDA but before the first large left ventricular branch.  Diffuse disease in the proximal to midportion of the large right ventricular branch and the second posterolateral branch.  Left ventriculography and hemodynamic recordings were not performed because of LV  apical thrombus.  Recommendations:   T CTS evaluation for surgical revascularization.  Guideline directed therapy for systolic heart failure: SGLT2 therapy for diabetes, resume ARB but preferably Entresto for decreased LV function, beta-blocker therapy  as tolerated, and diuresis as needed for symptom control.  Monitor kidney function closely.  Anticoagulation for acute coronary syndrome and LV thrombus  TTE 08/20/18  Study Conclusions  - Left ventricle: Systolic function was severely reduced. The   estimated ejection fraction was in the range of 25% to 30%.   Dyskinesis and scarring of the apical myocardium. Akinesis of the   mid-apicalanteroseptal, anterior, and anterolateral myocardium;   consistent with infarction in the distribution of the left   anterior descending coronary artery; unchanged from the study of   08/18/2018. Doppler parameters are consistent with restrictive   physiology, indicative of decreased left ventricular diastolic   compliance and/or increased left atrial pressure. No evidence of   thrombus. - Mitral valve: Calcified annulus. There was severe regurgitation   directed centrally. The acceleration rate of the regurgitant jet   was reduced, consistent with a low dP/dt. Severe regurgitation is   suggested by pulmonary vein systolic flow reversal. - Left atrium: The atrium was severely dilated. - Tricuspid valve: There was mild-moderate regurgitation directed   centrally. - Pulmonary arteries: Systolic pressure was mildly increased. PA   peak pressure: 43 mm Hg (S).  Impressions:  - Compared to the previous study images, the large LV apical   thrombus is no longer present. Wall motion and EF are unchanged,   but the mitral insufficiency appears worse.   Patient Profile     62 y.o. male with hx of DM-2 and known hx of CAD with remote stent > 20 years ago.  Admitted with 2 weeks progressive dyspnea and LE edema.  No chest pain found to be in HF.      Assessment & Plan     CAD   Cath  with severe 3 v dz including LM    Not considered a revascularization candidate by interventional cardiology or cardiac surgery  Diffuse severe dz in pt with severe kidney disease  Plan for medical Rx   Treatment limited at present by renal function  Not pushing to keep perfusion pressure     Acute systolic CHF   LVEF on echo  25 to 30% with aknes of the mid/distal/apical anteroseptal, anterior, anterolateral walls.   Thrombus seen on initial study no longer present    Diuresing slowly  REnal is following     CXR with bilateral effusion   Will order thoracentesis with USN guidance  VT arrest with shock and PEA.intubated and resuscitated  --now on amiodarone. 400 mg BID  --Dr. Ladona Ridgel has seen for EP -- Evaluating for ICD   Will be back to see pt prior to d/c  LV thrombus on repeat echo the thrombus no longer present.   -- on IV heparin for now whne procedures planned    AKI  Renal following  Cr 2.47 down from pk of 3.56 ( lasix 120 mg BID)  HLD on statin lipitor 40   DM-2 on SSI  --with glucose 125 to 205   Hx delirium with normal brain MRI.  Neg stroke per neuro.  Symptoms transient       For questions or updates, please contact [facility] Please consult [email or web address] for contact info under Cardiology/STEMI.    PT seen and examined   I have amended note above by L INgold t oreflect my findings    PT now in stepdown   SOB laying    Sats 85% On exam: Neck:  JVP is increased Cardiac RRR   No S3  Lungs :  Decreased BS at bases  Ext with tr to 1+ edema   Continue on IV lasix   Will order USN guided thoracentesis.  Dietrich Pates     Signed, Nada Boozer, NP  08/27/2018, 11:24 AM

## 2018-08-27 NOTE — Progress Notes (Signed)
ANTICOAGULATION CONSULT NOTE   Pharmacy Consult:  Heparin, warfarin Indication: chest pain/ACS/LV apical thrombus  No Known Allergies  Patient Measurements: Height: 5\' 9"  (175.3 cm) Weight: 208 lb 15.9 oz (94.8 kg) IBW/kg (Calculated) : 70.7 Heparin Dosing Weight: 90 kg  Vital Signs: Temp: 97.5 F (36.4 C) (08/30 0754) Temp Source: Oral (08/30 0754) BP: 140/89 (08/30 0754) Pulse Rate: 84 (08/30 0754)  Labs: Recent Labs    08/25/18 0358  08/26/18 0235 08/26/18 0823 08/27/18 0222  HGB 11.3*  --  12.6*  --  12.3*  HCT 34.8*  --  39.0  --  37.8*  PLT 179  --  233  --  211  LABPROT 16.1*  --  17.9*  --  27.5*  INR 1.30  --  1.49  --  2.59  HEPARINUNFRC 0.27*   < > 0.55 0.45 0.53  CREATININE 2.93*  --  2.78*  --  2.47*   < > = values in this interval not displayed.    Estimated Creatinine Clearance: 34.3 mL/min (A) (by C-G formula based on SCr of 2.47 mg/dL (H)).  Assessment: [age] YOM presents with 9 days of abdominal pain and increasing distension. Pt is s/p cath on 8/22 which showed multivessel CAD but not surgical/PCI candidate. She is noted with LV thrombus and chronic infarct. Patient was not on anticoagulation PTA. Plans noted for ICD and possible plans for thoracentesis (coumadin now on hold) -INR 2.59 (up from 2.59) -heparin level= 0.53    Goal of Therapy:  Heparin level 0.3-0.5 units/ml Monitor platelets by anticoagulation protocol: Yes  INR 2-3   Plan:  -hold coumadin until thoracentesis plans known -Decrease heparin to 1500 units/hr -Daily heparin level, CBC and INR  Harland GermanAndrew Jedidiah Demartini, PharmD Clinical Pharmacist Please check Amion for pharmacy contact number

## 2018-08-27 NOTE — Progress Notes (Signed)
Hep gtt restarted per pharmacy ok to restart. RN will monitor pt closely

## 2018-08-28 DIAGNOSIS — N183 Chronic kidney disease, stage 3 (moderate): Secondary | ICD-10-CM

## 2018-08-28 LAB — RENAL FUNCTION PANEL
Albumin: 2.9 g/dL — ABNORMAL LOW (ref 3.5–5.0)
Anion gap: 10 (ref 5–15)
BUN: 43 mg/dL — AB (ref 8–23)
CHLORIDE: 98 mmol/L (ref 98–111)
CO2: 29 mmol/L (ref 22–32)
Calcium: 9 mg/dL (ref 8.9–10.3)
Creatinine, Ser: 2.38 mg/dL — ABNORMAL HIGH (ref 0.61–1.24)
GFR calc Af Amer: 31 mL/min — ABNORMAL LOW (ref >60)
GFR calc non Af Amer: 27 mL/min — ABNORMAL LOW (ref >60)
Glucose, Bld: 153 mg/dL — ABNORMAL HIGH (ref 70–99)
POTASSIUM: 3.5 mmol/L (ref 3.5–5.1)
Phosphorus: 4 mg/dL (ref 2.5–4.6)
Sodium: 137 mmol/L (ref 135–145)

## 2018-08-28 LAB — CBC
HCT: 38.2 % — ABNORMAL LOW (ref 39.0–52.0)
Hemoglobin: 12.5 g/dL — ABNORMAL LOW (ref 13.0–17.0)
MCH: 27.9 pg (ref 26.0–34.0)
MCHC: 32.7 g/dL (ref 30.0–36.0)
MCV: 85.3 fL (ref 78.0–100.0)
PLATELETS: 236 10*3/uL (ref 150–400)
RBC: 4.48 MIL/uL (ref 4.22–5.81)
RDW: 14 % (ref 11.5–15.5)
WBC: 9.2 10*3/uL (ref 4.0–10.5)

## 2018-08-28 LAB — GLUCOSE, CAPILLARY
GLUCOSE-CAPILLARY: 235 mg/dL — AB (ref 70–99)
Glucose-Capillary: 186 mg/dL — ABNORMAL HIGH (ref 70–99)
Glucose-Capillary: 208 mg/dL — ABNORMAL HIGH (ref 70–99)
Glucose-Capillary: 133 mg/dL — ABNORMAL HIGH (ref 70–99)

## 2018-08-28 LAB — PROTIME-INR
INR: 2.11
Prothrombin Time: 23.5 seconds — ABNORMAL HIGH (ref 11.4–15.2)

## 2018-08-28 LAB — HEPARIN LEVEL (UNFRACTIONATED): HEPARIN UNFRACTIONATED: 0.19 [IU]/mL — AB (ref 0.30–0.70)

## 2018-08-28 MED ORDER — CARVEDILOL 3.125 MG PO TABS
3.1250 mg | ORAL_TABLET | Freq: Two times a day (BID) | ORAL | Status: DC
  Administered 2018-08-28 – 2018-09-06 (×18): 3.125 mg via ORAL
  Filled 2018-08-28 (×18): qty 1

## 2018-08-28 MED ORDER — WARFARIN SODIUM 2.5 MG PO TABS
2.5000 mg | ORAL_TABLET | Freq: Once | ORAL | Status: AC
  Administered 2018-08-28: 2.5 mg via ORAL
  Filled 2018-08-28: qty 1

## 2018-08-28 MED ORDER — WARFARIN - PHARMACIST DOSING INPATIENT: Freq: Every day | Status: DC

## 2018-08-28 NOTE — Progress Notes (Signed)
Patient ID: Jose Myers, male   DOB: 03-05-1956, 62 y.o.   MRN: [id number] [facility] Progress Note   Assessment/ Plan:   1.  Acute kidney injury on chronic kidney disease stage III (baseline creatinine 1.6-1.8): Likely secondary to ATN following cardiac arrest + contrast nephropathy.  Renal function continues to show gradual improvement with fair urine output charted overnight-possibly incompletely charted.  Clinically feeling better after thoracentesis.  I will set him up for outpatient follow-up with myself following discharge. 2.  Status post ventricular tachycardia arrest: No critical arrhythmias noted overnight on telemetry.  He remains on heparin drip 3.  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  Cardiology discussing options for management of his coronary artery disease with delayed PCI down the road after stabilization of renal function/improvement of respiratory status. 4.  Ischemic cardiomyopathy with CHF exacerbation: Continues to do well with current diuretic therapy and improvement of respiratory symptoms/pleural effusion with thoracentesis yesterday.  Will sign off at this time-please call with questions or concerns  Subjective:   Reports to be feeling significantly better following thoracentesis yesterday (1.4 L of amber/blood-tinged fluid).   Objective:   BP 116/84 (BP Location: Right Arm)   Pulse 80   Temp 98.2 F (36.8 C) (Oral)   Resp 20   Ht 5\' 9"  (1.753 m)   Wt 93.4 kg   SpO2 95%   BMI 30.41 kg/m   Intake/Output Summary (Last 24 hours) at 08/28/2018 0843 Last data filed at 08/28/2018 0400 Gross per 24 hour  Intake 1231.9 ml  Output 1211 ml  Net 20.9 ml   Weight change: -1.404 kg  Physical Exam: Gen: Actively  resting in bed, on oxygen via nasal cannula CVS: Pulse regular rhythm, normal rate, S1 and S2 normal Resp: Improved breath sounds over the bases without rales or rhonchi. Abd: Soft, obese, nontender Ext: Trace-1+ pretibial/lower extremity edema  Imaging: Dg Chest 1 View  Result Date: 08/27/2018 CLINICAL DATA:  Status post right-sided thoracentesis. EXAM: CHEST  1 VIEW COMPARISON:  Chest radiograph 08/25/2018 FINDINGS: Decreased size of right pleural effusion following thoracentesis. No pneumothorax. Improved aeration of the right lung base. Small left pleural effusion with associated atelectasis is unchanged. Left IJ approach central venous catheter is no longer present. IMPRESSION: Decreased size of right pleural effusion following thoracentesis. No pneumothorax. Electronically Signed   By: Deatra Robinson M.D.   On: 08/27/2018 16:20   Ir Thoracentesis Asp Pleural Space W/img Guide  Result Date: 08/27/2018 INDICATION: Patient with history of coronary artery disease, CHF, bilateral pleural effusions right greater than left. Request received for therapeutic right thoracentesis. EXAM: ULTRASOUND GUIDED THERAPEUTIC RIGHT THORACENTESIS MEDICATIONS: None COMPLICATIONS: None immediate. PROCEDURE: An ultrasound guided thoracentesis was thoroughly discussed with the patient and questions answered. The benefits, risks, alternatives and complications were also discussed. The patient understands and wishes to proceed with the procedure. Written consent was obtained. Ultrasound was performed to localize and mark an adequate pocket of fluid in the right chest. The area was then prepped and draped in the normal sterile fashion. 1% Lidocaine was used for local anesthesia. Under ultrasound guidance a 6 Fr Safe-T-Centesis catheter was introduced. Thoracentesis was performed. The catheter was removed and a dressing applied. FINDINGS: A total of approximately 1.4 liters of amber/blood-tinged fluid was removed. IMPRESSION:  Successful ultrasound  guided therapeutic right thoracentesis yielding 1.4 liters of pleural fluid. Read by: Jeananne RamaKevin Allred, PA-C Electronically Signed   By: Judie PetitM.  Shick M.D.   On: 08/27/2018 16:03    Labs: BMET Recent Labs  Lab 08/22/18 [id number] 08/23/18 [id number] 08/24/18 0447 08/25/18 0358 08/26/18 0235 08/27/18 0222 08/28/18 0219  NA 139 139 139 135 137 137 137  K 4.2 3.9 3.6 3.2* 3.6 4.1 3.5  CL 103 102 101 99 98 97* 98  CO2 21* 26 24 27 28 30 29   GLUCOSE 251* 228* 157* 144* 119* 110* 153*  BUN 41* 55* 60* 58* 55* 47* 43*  CREATININE 3.47* 3.56* 3.38* 2.93* 2.78* 2.47* 2.38*  CALCIUM 8.2* 8.4* 8.2* 8.2* 8.8* 8.8* 9.0  PHOS  --  4.8* 4.7* 4.2 4.0 3.9 4.0   CBC Recent Labs  Lab 08/25/18 0358 08/26/18 0235 08/27/18 0222 08/28/18 0219  WBC 8.4 10.3 9.9 9.2  HGB 11.3* 12.6* 12.3* 12.5*  HCT 34.8* 39.0 37.8* 38.2*  MCV 87.9 86.9 86.1 85.3  PLT 179 233 211 236    Medications:    . amiodarone  400 mg Oral BID  . aspirin EC  81 mg Oral Daily  . atorvastatin  40 mg Oral q1800  . Chlorhexidine Gluconate Cloth  6 each Topical Daily  . feeding supplement (ENSURE ENLIVE)  237 mL Oral BID BM  . insulin aspart  0-15 Units Subcutaneous TID WC  . insulin aspart  0-5 Units Subcutaneous QHS  . insulin glargine  10 Units Subcutaneous Daily  . mouth rinse  15 mL Mouth Rinse BID  . omega-3 acid ethyl esters  1 g Oral QHS  . sertraline  25 mg Oral Daily  . sodium chloride flush  3 mL Intravenous Q12H    Zetta BillsJay Alexcis Bicking, MD 08/28/2018, 8:43 AM

## 2018-08-28 NOTE — Progress Notes (Signed)
1458 Checked with pt to walk. He stated he had walked with staff and tired now. Encouraged pt to continue to walk with staff and we would follow up Tuesday. Luetta NuttingCharlene Cortlan Dolin RN BSN 08/28/2018 3:02 PM

## 2018-08-28 NOTE — Progress Notes (Signed)
Progress Note  Patient Name: Jose Myers Date of Encounter: 08/28/2018  Primary Cardiologist: Dr. [name]  Subjective   No chest pain or shortness of breath at rest.  Has been walking some in the halls.  Stable appetite.  Generally improving stamina.  Inpatient Medications    Scheduled Meds: . amiodarone  400 mg Oral BID  . aspirin EC  81 mg Oral Daily  . atorvastatin  40 mg Oral q1800  . Chlorhexidine Gluconate Cloth  6 each Topical Daily  . feeding supplement (ENSURE ENLIVE)  237 mL Oral BID BM  . insulin aspart  0-15 Units Subcutaneous TID WC  . insulin aspart  0-5 Units Subcutaneous QHS  . insulin glargine  10 Units Subcutaneous Daily  . mouth rinse  15 mL Mouth Rinse BID  . omega-3 acid ethyl esters  1 g Oral QHS  . sertraline  25 mg Oral Daily  . sodium chloride flush  3 mL Intravenous Q12H   Continuous Infusions: . sodium chloride Stopped (08/26/18 1710)  . furosemide 120 mg (08/27/18 1814)  . heparin 1,500 Units/hr (08/28/18 0800)   PRN Meds: sodium chloride, acetaminophen **OR** acetaminophen, acetaminophen-codeine, docusate sodium, fentaNYL, LORazepam, ondansetron (ZOFRAN) IV, polyethylene glycol, promethazine, RESOURCE THICKENUP CLEAR, sodium chloride flush, sodium chloride flush, traZODone   Vital Signs    Vitals:   08/27/18 2313 08/28/18 0326 08/28/18 0611 08/28/18 0823  BP: 108/87 110/81  116/84  Pulse: 77 77  80  Resp: 19 19  20   Temp: 98 F (36.7 C) 98.2 F (36.8 C)    TempSrc: Oral Oral    SpO2: 95% 95%  95%  Weight:   93.4 kg   Height:        Intake/Output Summary (Last 24 hours) at 08/28/2018 1124 Last data filed at 08/28/2018 1100 Gross per 24 hour  Intake 1535.9 ml  Output 1661 ml  Net -125.1 ml   Filed Weights   08/26/18 0600 08/27/18 0600 08/28/18 0611  Weight: 96.2 kg 94.8 kg 93.4 kg    Telemetry    Sinus rhythm, occasional PVCs.  Personally reviewed.  Physical Exam   GEN: No acute distress.   Neck: No  JVD. Cardiac: RRR, no gallop.  Respiratory: Clear to auscultation bilaterally.  Decreased at right base. GI: Soft, nontender, bowel sounds present. MS: No edema; No deformity. Neuro:  Nonfocal. Psych: Alert and oriented x 3. Normal affect.  Labs    Chemistry Recent Labs  Lab 08/26/18 0235 08/27/18 0222 08/28/18 0219  NA 137 137 137  K 3.6 4.1 3.5  CL 98 97* 98  CO2 28 30 29   GLUCOSE 119* 110* 153*  BUN 55* 47* 43*  CREATININE 2.78* 2.47* 2.38*  CALCIUM 8.8* 8.8* 9.0  ALBUMIN 3.0* 2.9* 2.9*  GFRNONAA 23* 26* 27*  GFRAA 26* 30* 31*  ANIONGAP 11 10 10      Hematology Recent Labs  Lab 08/26/18 0235 08/27/18 0222 08/28/18 0219  WBC 10.3 9.9 9.2  RBC 4.49 4.39 4.48  HGB 12.6* 12.3* 12.5*  HCT 39.0 37.8* 38.2*  MCV 86.9 86.1 85.3  MCH 28.1 28.0 27.9  MCHC 32.3 32.5 32.7  RDW 14.4 14.0 14.0  PLT 233 211 236    Radiology    Dg Chest 1 View  Result Date: 08/27/2018 CLINICAL DATA:  Status post right-sided thoracentesis. EXAM: CHEST  1 VIEW COMPARISON:  Chest radiograph 08/25/2018 FINDINGS: Decreased size of right pleural effusion following thoracentesis. No pneumothorax. Improved aeration of the right lung base.  Small left pleural effusion with associated atelectasis is unchanged. Left IJ approach central venous catheter is no longer present. IMPRESSION: Decreased size of right pleural effusion following thoracentesis. No pneumothorax. Electronically Signed   By: Deatra Robinson M.D.   On: 08/27/2018 16:20   Ir Thoracentesis Asp Pleural Space W/img Guide  Result Date: 08/27/2018 INDICATION: Patient with history of coronary artery disease, CHF, bilateral pleural effusions right greater than left. Request received for therapeutic right thoracentesis. EXAM: ULTRASOUND GUIDED THERAPEUTIC RIGHT THORACENTESIS MEDICATIONS: None COMPLICATIONS: None immediate. PROCEDURE: An ultrasound guided thoracentesis was thoroughly discussed with the patient and questions answered. The benefits,  risks, alternatives and complications were also discussed. The patient understands and wishes to proceed with the procedure. Written consent was obtained. Ultrasound was performed to localize and mark an adequate pocket of fluid in the right chest. The area was then prepped and draped in the normal sterile fashion. 1% Lidocaine was used for local anesthesia. Under ultrasound guidance a 6 Fr Safe-T-Centesis catheter was introduced. Thoracentesis was performed. The catheter was removed and a dressing applied. FINDINGS: A total of approximately 1.4 liters of amber/blood-tinged fluid was removed. IMPRESSION: Successful ultrasound guided therapeutic right thoracentesis yielding 1.4 liters of pleural fluid. Read by: Jeananne Rama, PA-C Electronically Signed   By: Judie Petit.  Shick M.D.   On: 08/27/2018 16:03    Cardiac Studies   Echocardiogram 08/20/2018: Study Conclusions  - Left ventricle: Systolic function was severely reduced. The   estimated ejection fraction was in the range of 25% to 30%.   Dyskinesis and scarring of the apical myocardium. Akinesis of the   mid-apicalanteroseptal, anterior, and anterolateral myocardium;   consistent with infarction in the distribution of the left   anterior descending coronary artery; unchanged from the study of   08/18/2018. Doppler parameters are consistent with restrictive   physiology, indicative of decreased left ventricular diastolic   compliance and/or increased left atrial pressure. No evidence of   thrombus. - Mitral valve: Calcified annulus. There was severe regurgitation   directed centrally. The acceleration rate of the regurgitant jet   was reduced, consistent with a low dP/dt. Severe regurgitation is   suggested by pulmonary vein systolic flow reversal. - Left atrium: The atrium was severely dilated. - Tricuspid valve: There was mild-moderate regurgitation directed   centrally. - Pulmonary arteries: Systolic pressure was mildly increased. PA   peak  pressure: 43 mm Hg (S).  Impressions:  - Compared to the previous study images, the large LV apical   thrombus is no longer present. Wall motion and EF are unchanged,   but the mitral insufficiency appears worse.  Cardiac catheterization 08/19/2018:   Severe multivessel CAD including left main.  Distal left main 85%.  Diffusely diseased and totally occluded LAD in the mid segment.  Proximal 80 to 99% obstruction.  Diagonals are totally occluded.  80% ostial circumflex, 90% proximal first obtuse marginal, and total occlusion of the circumflex beyond the first marginal.  Diffuse disease in the first marginal.  Dominant right coronary with segmental 70% distal stenosis, 85% distal RCA beyond the PDA but before the first large left ventricular branch.  Diffuse disease in the proximal to midportion of the large right ventricular branch and the second posterolateral branch.  Left ventriculography and hemodynamic recordings were not performed because of LV apical thrombus.  Recommendations:   T CTS evaluation for surgical revascularization.  Guideline directed therapy for systolic heart failure: SGLT2 therapy for diabetes, resume ARB but preferably Entresto for decreased LV function,  beta-blocker therapy as tolerated, and diuresis as needed for symptom control.  Monitor kidney function closely.  Anticoagulation for acute coronary syndrome and LV thrombus  Patient Profile     62 y.o. male with a history of type 2 diabetes mellitus, recently documented severe multivessel CAD with poor revascularization options, cardiomyopathy with LVEF 25 to 30% and LV mural thrombus, VT arrest and PEA successfully resuscitated, acute renal failure, hyperlipidemia, and transient delirium.  Assessment & Plan    1.  Severe multivessel/left main CAD with poor revascularization options.  Medical therapy is planned at this time.  Currently on aspirin, Lipitor, and Lovaza.  2.  Status post VT arrest,  currently on amiodarone.  He has been evaluated by the EP service and although has clear indication for ICD, current medical complexity and degree of ischemic heart disease represent significant concerns about pursuing device therapies.  3.  Acute on chronic renal failure with CKD stage III at baseline.  Creatinine 2.38.  Nephrology has signed off.  Patient making reasonable urine output.  4.  Ischemic cardiomyopathy with LVEF 25 to 30%.  5.  LV mural thrombus, currently on IV heparin per pharmacy with plan to transition to Coumadin.  6.  Status post right-sided thoracentesis with significant improvement in right pleural effusion.  7.  Mixed hyperlipidemia, on Lipitor and Lovaza.  Continue aspirin, Lipitor, Lovaza, and amiodarone load.  Try and add low-dose Coreg 3.125 mg twice daily.  Not a candidate for ACE inhibitor or ARB at this time in light of his acute on chronic renal insufficiency.  As further invasive testing is not clearly planned at this point, we will go ahead and initiate Coumadin per pharmacy with heparin bridge given LV thrombus.  Signed, Nona DellSamuel McDowell, MD  08/28/2018, 11:24 AM

## 2018-08-28 NOTE — Progress Notes (Addendum)
ANTICOAGULATION CONSULT NOTE   Pharmacy Consult:  Heparin, warfarin Indication: chest pain/ACS/LV apical thrombus  No Known Allergies  Patient Measurements: Height: 5\' 9"  (175.3 cm) Weight: 205 lb 14.4 oz (93.4 kg) IBW/kg (Calculated) : 70.7 Heparin Dosing Weight: 90 kg  Vital Signs: Temp: 98.2 F (36.8 C) (08/31 0326) Temp Source: Oral (08/31 0326) BP: 110/81 (08/31 0326) Pulse Rate: 77 (08/31 0326)  Labs: Recent Labs    08/26/18 0235 08/26/18 0823 08/27/18 0222 08/28/18 0219  HGB 12.6*  --  12.3* 12.5*  HCT 39.0  --  37.8* 38.2*  PLT 233  --  211 236  LABPROT 17.9*  --  27.5* 23.5*  INR 1.49  --  2.59 2.11  HEPARINUNFRC 0.55 0.45 0.53 0.19*  CREATININE 2.78*  --  2.47* 2.38*    Estimated Creatinine Clearance: 35.4 mL/min (A) (by C-G formula based on SCr of 2.38 mg/dL (H)).  Assessment: [age] YOM presents with 9 days of abdominal pain and increasing distension. Pt is s/p cath on 8/22 which showed multivessel CAD but not surgical/PCI candidate. She is noted with LV thrombus (now absent on repeat echo and chronic infarct. Patient was not on anticoagulation PTA.  S/p thoracentesis yesterday.  Warfarin has been on hold 8/29 and 8/30 d/t procedures planned, heparin restarted last night and level is low but unsure if infusion is needed with thora completed and no further procedures planned also INR>2.   Will hold off on rate adjustment this morning and discuss with cards if warfarin alone can be resumed at low dose and stop heparin.    Goal of Therapy:  Heparin level 0.3-0.7 units/ml Monitor platelets by anticoagulation protocol: Yes  INR 2-3   Plan:  [facility]  Sheppard CoilFrank Wilson PharmD., BCPS Clinical Pharmacist 08/28/2018 7:37 AM  Addendum Warfarin to restart tonight, INR > 2 so heparin no longer needed. Will dose warfarin cautiously given trend.   Stop heparin Warfarin 2.5mg  tonight INR in am.  08/28/2018 1:35 PM

## 2018-08-29 DIAGNOSIS — I25119 Atherosclerotic heart disease of native coronary artery with unspecified angina pectoris: Secondary | ICD-10-CM

## 2018-08-29 LAB — RENAL FUNCTION PANEL
ALBUMIN: 2.9 g/dL — AB (ref 3.5–5.0)
Anion gap: 11 (ref 5–15)
BUN: 42 mg/dL — AB (ref 8–23)
CHLORIDE: 96 mmol/L — AB (ref 98–111)
CO2: 28 mmol/L (ref 22–32)
Calcium: 8.7 mg/dL — ABNORMAL LOW (ref 8.9–10.3)
Creatinine, Ser: 2.49 mg/dL — ABNORMAL HIGH (ref 0.61–1.24)
GFR calc Af Amer: 30 mL/min — ABNORMAL LOW (ref >60)
GFR, EST NON AFRICAN AMERICAN: 26 mL/min — AB (ref >60)
GLUCOSE: 166 mg/dL — AB (ref 70–99)
POTASSIUM: 3.5 mmol/L (ref 3.5–5.1)
Phosphorus: 4.3 mg/dL (ref 2.5–4.6)
Sodium: 135 mmol/L (ref 135–145)

## 2018-08-29 LAB — GLUCOSE, CAPILLARY
GLUCOSE-CAPILLARY: 164 mg/dL — AB (ref 70–99)
GLUCOSE-CAPILLARY: 226 mg/dL — AB (ref 70–99)
Glucose-Capillary: 251 mg/dL — ABNORMAL HIGH (ref 70–99)
Glucose-Capillary: 119 mg/dL — ABNORMAL HIGH (ref 70–99)

## 2018-08-29 LAB — CBC
HCT: 38.6 % — ABNORMAL LOW (ref 39.0–52.0)
HEMOGLOBIN: 12.6 g/dL — AB (ref 13.0–17.0)
MCH: 28.2 pg (ref 26.0–34.0)
MCHC: 32.6 g/dL (ref 30.0–36.0)
MCV: 86.4 fL (ref 78.0–100.0)
Platelets: 215 10*3/uL (ref 150–400)
RBC: 4.47 MIL/uL (ref 4.22–5.81)
RDW: 14.4 % (ref 11.5–15.5)
WBC: 9.8 10*3/uL (ref 4.0–10.5)

## 2018-08-29 LAB — PROTIME-INR
INR: 2.31
PROTHROMBIN TIME: 25.2 s — AB (ref 11.4–15.2)

## 2018-08-29 MED ORDER — WARFARIN SODIUM 2.5 MG PO TABS
2.5000 mg | ORAL_TABLET | Freq: Once | ORAL | Status: AC
  Administered 2018-08-29: 2.5 mg via ORAL
  Filled 2018-08-29: qty 1

## 2018-08-29 MED ORDER — PATIENT'S GUIDE TO USING COUMADIN BOOK
Freq: Once | Status: DC
  Filled 2018-08-29: qty 1

## 2018-08-29 NOTE — Progress Notes (Signed)
RN noticed moderate size bruising on Lower R side of patient. Per pt site not tender. Hep gtt stopped yesterday. Provider made aware, this RN will continue to monitor.

## 2018-08-29 NOTE — Progress Notes (Signed)
ANTICOAGULATION CONSULT NOTE   Pharmacy Consult: Warfarin Indication: chest pain/ACS/LV apical thrombus  No Known Allergies  Patient Measurements: Height: 5\' 9"  (175.3 cm) Weight: 207 lb 7.3 oz (94.1 kg) IBW/kg (Calculated) : 70.7 Heparin Dosing Weight: 90 kg  Vital Signs: Temp: 97.6 F (36.4 C) (09/01 0810) Temp Source: Oral (09/01 0810) BP: 111/81 (09/01 0810) Pulse Rate: 76 (09/01 0810)  Labs: Recent Labs    08/27/18 0222 08/28/18 0219 08/29/18 0210  HGB 12.3* 12.5* 12.6*  HCT 37.8* 38.2* 38.6*  PLT 211 236 215  LABPROT 27.5* 23.5* 25.2*  INR 2.59 2.11 2.31  HEPARINUNFRC 0.53 0.19*  --   CREATININE 2.47* 2.38* 2.49*    Estimated Creatinine Clearance: 34 mL/min (A) (by C-G formula based on SCr of 2.49 mg/dL (H)).  Assessment: 16 YOM presents with 9 days of abdominal pain and increasing distension. Pt is s/p cath on 8/22 which showed multivessel CAD but not surgical/PCI candidate. She is noted with LV thrombus (now absent on repeat echo and chronic infarct. Patient was not on anticoagulation PTA.  S/p thoracentesis yesterday.  Warfarin held 8/29 and 8/30 d/t procedures planned, restarted last night. Heparin stopped yesterday with therapeutic INR, still at goal at 2.3 this am. CBC remains stable.    Goal of Therapy:  Monitor platelets by anticoagulation protocol: Yes  INR 2-3   Plan:  Warfarin 2.5mg  tonight INR daily for now  Sheppard Coil PharmD., BCPS Clinical Pharmacist 08/29/2018 8:48 AM

## 2018-08-29 NOTE — Discharge Instructions (Addendum)
Supplemental Discharge Instructions for  Pacemaker/Defibrillator Patients  Activity No heavy lifting or vigorous activity with your left/right arm for 6 to 8 weeks.  Do not raise your left/right arm above your head for one week.  Gradually raise your affected arm as drawn below.              09/04/18                       09/05/18                      09/06/18                    09/07/18 __  NO DRIVING for 6 months  WOUND CARE - Keep the wound area clean and dry.  Do not get this area wet for one week. No showers for one week; you may shower on  09/07/18  . - The tape/steri-strips on your wound will fall off; do not pull them off.  No bandage is needed on the site.  DO  NOT apply any creams, oils, or ointments to the wound area. - If you notice any drainage or discharge from the wound, any swelling or bruising at the site, or you develop a fever > 101? F after you are discharged home, call the office at once.  Special Instructions - You are still able to use cellular telephones; use the ear opposite the side where you have your pacemaker/defibrillator.  Avoid carrying your cellular phone near your device. - When traveling through airports, show security personnel your identification card to avoid being screened in the metal detectors.  Ask the security personnel to use the hand wand. - Avoid arc welding equipment, MRI testing (magnetic resonance imaging), TENS units (transcutaneous nerve stimulators).  Call the office for questions about other devices. - Avoid electrical appliances that are in poor condition or are not properly grounded. - Microwave ovens are safe to be near or to operate.  Additional information for defibrillator patients should your device go off: - If your device goes off ONCE and you feel fine afterward, notify the device clinic nurses. - If your device goes off ONCE and you do not feel well afterward, call 911. - If your device goes off TWICE, call 911. - If your  device goes off THREE times in one day, call 911.  DO NOT DRIVE YOURSELF OR A FAMILY MEMBER WITH A DEFIBRILLATOR TO THE HOSPITAL--CALL 911.   Information on my medicine - Coumadin   (Warfarin)  This medication education was reviewed with me or my healthcare representative as part of my discharge preparation.  The pharmacist that spoke with me during my hospital stay was:  Severiano Gilbert, [facility]  Why was Coumadin prescribed for you? Coumadin was prescribed for you because you have a blood clot or a medical condition that can cause an increased risk of forming blood clots. Blood clots can cause serious health problems by blocking the flow of blood to the heart, lung, or brain. Coumadin can prevent harmful blood clots from forming. As a reminder your indication for Coumadin is: blood clot in heart.   What test will check on my response to Coumadin? While on Coumadin (warfarin) you will need to have an INR test regularly to ensure that your dose is keeping you in the desired range. The INR (international normalized ratio) number is calculated from the result of the laboratory  test called prothrombin time (PT).  If an INR APPOINTMENT HAS NOT ALREADY BEEN MADE FOR YOU please schedule an appointment to have this lab work done by your health care provider within 7 days. Your INR goal is usually a number between:  2 to 3 or your provider may give you a more narrow range like 2-2.5.  Ask your health care provider during an office visit what your goal INR is.  What  do you need to  know  About  COUMADIN? Take Coumadin (warfarin) exactly as prescribed by your healthcare provider about the same time each day.  DO NOT stop taking without talking to the doctor who prescribed the medication.  Stopping without other blood clot prevention medication to take the place of Coumadin may increase your risk of developing a new clot or stroke.  Get refills before you run out.  What do you do if you miss a dose? If  you miss a dose, take it as soon as you remember on the same day then continue your regularly scheduled regimen the next day.  Do not take two doses of Coumadin at the same time.  Important Safety Information A possible side effect of Coumadin (Warfarin) is an increased risk of bleeding. You should call your healthcare provider right away if you experience any of the following: ? Bleeding from an injury or your nose that does not stop. ? Unusual colored urine (red or dark brown) or unusual colored stools (red or black). ? Unusual bruising for unknown reasons. ? A serious fall or if you hit your head (even if there is no bleeding).  Some foods or medicines interact with Coumadin (warfarin) and might alter your response to warfarin. To help avoid this: ? Eat a balanced diet, maintaining a consistent amount of Vitamin K. ? Notify your provider about major diet changes you plan to make. ? Avoid alcohol or limit your intake to 1 drink for women and 2 drinks for men per day. (1 drink is 5 oz. wine, 12 oz. beer, or 1.5 oz. liquor.)  Make sure that ANY health care provider who prescribes medication for you knows that you are taking Coumadin (warfarin).  Also make sure the healthcare provider who is monitoring your Coumadin knows when you have started a new medication including herbals and non-prescription products.  Coumadin (Warfarin)  Major Drug Interactions  Increased Warfarin Effect Decreased Warfarin Effect  Alcohol (large quantities) Antibiotics (esp. Septra/Bactrim, Flagyl, Cipro) Amiodarone (Cordarone) Aspirin (ASA) Cimetidine (Tagamet) Megestrol (Megace) NSAIDs (ibuprofen, naproxen, etc.) Piroxicam (Feldene) Propafenone (Rythmol SR) Propranolol (Inderal) Isoniazid (INH) Posaconazole (Noxafil) Barbiturates (Phenobarbital) Carbamazepine (Tegretol) Chlordiazepoxide (Librium) Cholestyramine (Questran) Griseofulvin Oral Contraceptives Rifampin Sucralfate (Carafate) Vitamin K    Coumadin (Warfarin) Major Herbal Interactions  Increased Warfarin Effect Decreased Warfarin Effect  Garlic Ginseng Ginkgo biloba Coenzyme Q10 Green tea St. Johns wort    Coumadin (Warfarin) FOOD Interactions  Eat a consistent number of servings per week of foods HIGH in Vitamin K (1 serving =  cup)  Collards (cooked, or boiled & drained) Kale (cooked, or boiled & drained) Mustard greens (cooked, or boiled & drained) Parsley *serving size only =  cup Spinach (cooked, or boiled & drained) Swiss chard (cooked, or boiled & drained) Turnip greens (cooked, or boiled & drained)  Eat a consistent number of servings per week of foods MEDIUM-HIGH in Vitamin K (1 serving = 1 cup)  Asparagus (cooked, or boiled & drained) Broccoli (cooked, boiled & drained, or raw & chopped) Brussel sprouts (  cooked, or boiled & drained) *serving size only =  cup Lettuce, raw (green leaf, endive, romaine) Spinach, raw Turnip greens, raw & chopped   These websites have more information on Coumadin (warfarin):  http://www.king-russell.com/; https://www.hines.net/;

## 2018-08-29 NOTE — Progress Notes (Signed)
Progress Note  Patient Name: Jose Myers Date of Encounter: 08/29/2018  Primary Cardiologist: Dr. [name]  Subjective   No chest pain or shortness of breath at rest.  Has had some pain at ecchymotic side on the right lower flank.  No abdominal pain, no nausea or emesis.  Still tolerating walking in the hall well.  Inpatient Medications    Scheduled Meds: . amiodarone  400 mg Oral BID  . aspirin EC  81 mg Oral Daily  . atorvastatin  40 mg Oral q1800  . carvedilol  3.125 mg Oral BID WC  . feeding supplement (ENSURE ENLIVE)  237 mL Oral BID BM  . insulin aspart  0-15 Units Subcutaneous TID WC  . insulin aspart  0-5 Units Subcutaneous QHS  . insulin glargine  10 Units Subcutaneous Daily  . omega-3 acid ethyl esters  1 g Oral QHS  . patient's guide to using coumadin book   Does not apply Once  . sertraline  25 mg Oral Daily  . warfarin  2.5 mg Oral ONCE-1800  . Warfarin - Pharmacist Dosing Inpatient   Does not apply q1800   Continuous Infusions: . sodium chloride Stopped (08/26/18 1710)  . furosemide 120 mg (08/28/18 1727)   PRN Meds: sodium chloride, acetaminophen **OR** acetaminophen, acetaminophen-codeine, docusate sodium, fentaNYL, LORazepam, ondansetron (ZOFRAN) IV, polyethylene glycol, promethazine, RESOURCE THICKENUP CLEAR, sodium chloride flush, traZODone   Vital Signs    Vitals:   08/28/18 2329 08/29/18 0300 08/29/18 0510 08/29/18 0810  BP: 104/71  115/82 111/81  Pulse:   89 76  Resp: 16     Temp: 97.8 F (36.6 C)  97.9 F (36.6 C) 97.6 F (36.4 C)  TempSrc: Oral  Oral Oral  SpO2:   94% 100%  Weight:  94.1 kg    Height:        Intake/Output Summary (Last 24 hours) at 08/29/2018 1030 Last data filed at 08/29/2018 0100 Gross per 24 hour  Intake 784 ml  Output 1150 ml  Net -366 ml   Filed Weights   08/27/18 0600 08/28/18 0611 08/29/18 0300  Weight: 94.8 kg 93.4 kg 94.1 kg    Telemetry    Sinus rhythm.  Personally  reviewed.  Physical Exam   GEN: No acute distress.   Neck: No JVD. Cardiac: RRR, no gallop.  Respiratory: Nonlabored.  Few crackles at the right base. GI: Soft, nontender, bowel sounds present. MS: No edema; No deformity. Skin: Ecchymosis right lower flank. Neuro:  Nonfocal. Psych: Alert and oriented x 3. Normal affect.  Labs    Chemistry Recent Labs  Lab 08/27/18 0222 08/28/18 0219 08/29/18 0210  NA 137 137 135  K 4.1 3.5 3.5  CL 97* 98 96*  CO2 30 29 28   GLUCOSE 110* 153* 166*  BUN 47* 43* 42*  CREATININE 2.47* 2.38* 2.49*  CALCIUM 8.8* 9.0 8.7*  ALBUMIN 2.9* 2.9* 2.9*  GFRNONAA 26* 27* 26*  GFRAA 30* 31* 30*  ANIONGAP 10 10 11      Hematology Recent Labs  Lab 08/27/18 0222 08/28/18 0219 08/29/18 0210  WBC 9.9 9.2 9.8  RBC 4.39 4.48 4.47  HGB 12.3* 12.5* 12.6*  HCT 37.8* 38.2* 38.6*  MCV 86.1 85.3 86.4  MCH 28.0 27.9 28.2  MCHC 32.5 32.7 32.6  RDW 14.0 14.0 14.4  PLT 211 236 215    Radiology    Dg Chest 1 View  Result Date: 08/27/2018 CLINICAL DATA:  Status post right-sided thoracentesis. EXAM: CHEST  1 VIEW  COMPARISON:  Chest radiograph 08/25/2018 FINDINGS: Decreased size of right pleural effusion following thoracentesis. No pneumothorax. Improved aeration of the right lung base. Small left pleural effusion with associated atelectasis is unchanged. Left IJ approach central venous catheter is no longer present. IMPRESSION: Decreased size of right pleural effusion following thoracentesis. No pneumothorax. Electronically Signed   By: Deatra Robinson M.D.   On: 08/27/2018 16:20   Ir Thoracentesis Asp Pleural Space W/img Guide  Result Date: 08/27/2018 INDICATION: Patient with history of coronary artery disease, CHF, bilateral pleural effusions right greater than left. Request received for therapeutic right thoracentesis. EXAM: ULTRASOUND GUIDED THERAPEUTIC RIGHT THORACENTESIS MEDICATIONS: None COMPLICATIONS: None immediate. PROCEDURE: An ultrasound guided  thoracentesis was thoroughly discussed with the patient and questions answered. The benefits, risks, alternatives and complications were also discussed. The patient understands and wishes to proceed with the procedure. Written consent was obtained. Ultrasound was performed to localize and mark an adequate pocket of fluid in the right chest. The area was then prepped and draped in the normal sterile fashion. 1% Lidocaine was used for local anesthesia. Under ultrasound guidance a 6 Fr Safe-T-Centesis catheter was introduced. Thoracentesis was performed. The catheter was removed and a dressing applied. FINDINGS: A total of approximately 1.4 liters of amber/blood-tinged fluid was removed. IMPRESSION: Successful ultrasound guided therapeutic right thoracentesis yielding 1.4 liters of pleural fluid. Read by: Jeananne Rama, PA-C Electronically Signed   By: Judie Petit.  Shick M.D.   On: 08/27/2018 16:03    Cardiac Studies   Echocardiogram 08/20/2018: Study Conclusions  - Left ventricle: Systolic function was severely reduced. The estimated ejection fraction was in the range of 25% to 30%. Dyskinesis and scarring of the apical myocardium. Akinesis of the mid-apicalanteroseptal, anterior, and anterolateral myocardium; consistent with infarction in the distribution of the left anterior descending coronary artery; unchanged from the study of 08/18/2018. Doppler parameters are consistent with restrictive physiology, indicative of decreased left ventricular diastolic compliance and/or increased left atrial pressure. No evidence of thrombus. - Mitral valve: Calcified annulus. There was severe regurgitation directed centrally. The acceleration rate of the regurgitant jet was reduced, consistent with a low dP/dt. Severe regurgitation is suggested by pulmonary vein systolic flow reversal. - Left atrium: The atrium was severely dilated. - Tricuspid valve: There was mild-moderate regurgitation  directed centrally. - Pulmonary arteries: Systolic pressure was mildly increased. PA peak pressure: 43 mm Hg (S).  Impressions:  - Compared to the previous study images, the large LV apical thrombus is no longer present. Wall motion and EF are unchanged, but the mitral insufficiency appears worse.  Cardiac catheterization 08/19/2018:  Severe multivessel CAD including left main. Distal left main 85%.  Diffusely diseased and totally occluded LAD in the mid segment. Proximal 80 to 99% obstruction. Diagonals are totally occluded.  80% ostial circumflex, 90% proximal first obtuse marginal, and total occlusion of the circumflex beyond the first marginal. Diffuse disease in the first marginal.  Dominant right coronary with segmental 70% distal stenosis, 85% distal RCA beyond the PDA but before the first large left ventricular branch. Diffuse disease in the proximal to midportion of the large right ventricular branch and the second posterolateral branch.  Left ventriculography and hemodynamic recordings were not performed because of LV apical thrombus.  Recommendations:   TCTS evaluation for surgical revascularization.  Guideline directed therapy for systolic heart failure: SGLT2 therapy for diabetes, resume ARB but preferably Entresto for decreased LV function, beta-blocker therapy as tolerated, and diuresis as needed for symptom control.  Monitor kidney function closely.  Anticoagulation for acute coronary syndrome and LV thrombus  Patient Profile     62 y.o. male with a history of type 2 diabetes mellitus, recently documented severe multivessel CAD with poor revascularization options, cardiomyopathy with LVEF 25 to 30% and LV mural thrombus, VT arrest and PEA successfully resuscitated, acute renal failure, hyperlipidemia, and transient delirium.  Assessment & Plan    1.  Severe multivessel/left main CAD with poor revascularization options.  Medical therapy is  planned at this time.  He reports no active angina.  2.  Status post VT arrest, currently on amiodarone.  He has had EP evaluation by Dr. Ladona Ridgel and although has clear indication for ICD, current medical complexity and degree of ischemic heart disease (not revascularization options) represent significant concerns as it relates to pursuing device therapy.  3.  Ischemic cardiomyopathy with LVEF 25 to 30%.  4.  LV mural thrombus, currently on Coumadin.  5.  Acute on chronic renal failure with CKD stage III at baseline.  Follow-up creatinine 2.49.  Relatively stable.  6.  Mixed hyperlipidemia on Lipitor and Lovaza.  7.  Status post therapeutic right thoracentesis.  Continue ambulation, work with cardiac rehabilitation as tolerated.  Underscore it was added yesterday, otherwise continue aspirin, Lipitor, Lovaza, and amiodarone load.  He is not a candidate for ACE inhibitor or ARB in light of his acute on chronic renal insufficiency.  Transfer to telemetry.  Signed, Nona Dell, MD  08/29/2018, 10:30 AM

## 2018-08-30 LAB — CBC
HEMATOCRIT: 37.1 % — AB (ref 39.0–52.0)
HEMOGLOBIN: 12 g/dL — AB (ref 13.0–17.0)
MCH: 27.8 pg (ref 26.0–34.0)
MCHC: 32.3 g/dL (ref 30.0–36.0)
MCV: 86.1 fL (ref 78.0–100.0)
Platelets: 214 10*3/uL (ref 150–400)
RBC: 4.31 MIL/uL (ref 4.22–5.81)
RDW: 14.3 % (ref 11.5–15.5)
WBC: 12.2 10*3/uL — ABNORMAL HIGH (ref 4.0–10.5)

## 2018-08-30 LAB — BASIC METABOLIC PANEL
Anion gap: 8 (ref 5–15)
BUN: 40 mg/dL — ABNORMAL HIGH (ref 8–23)
CHLORIDE: 98 mmol/L (ref 98–111)
CO2: 30 mmol/L (ref 22–32)
Calcium: 8.4 mg/dL — ABNORMAL LOW (ref 8.9–10.3)
Creatinine, Ser: 2.46 mg/dL — ABNORMAL HIGH (ref 0.61–1.24)
GFR calc non Af Amer: 26 mL/min — ABNORMAL LOW (ref >60)
GFR, EST AFRICAN AMERICAN: 30 mL/min — AB (ref >60)
Glucose, Bld: 120 mg/dL — ABNORMAL HIGH (ref 70–99)
POTASSIUM: 3.4 mmol/L — AB (ref 3.5–5.1)
Sodium: 136 mmol/L (ref 135–145)

## 2018-08-30 LAB — GLUCOSE, CAPILLARY
GLUCOSE-CAPILLARY: 140 mg/dL — AB (ref 70–99)
Glucose-Capillary: 229 mg/dL — ABNORMAL HIGH (ref 70–99)
Glucose-Capillary: 265 mg/dL — ABNORMAL HIGH (ref 70–99)
Glucose-Capillary: 232 mg/dL — ABNORMAL HIGH (ref 70–99)

## 2018-08-30 LAB — PROTIME-INR
INR: 2.19
Prothrombin Time: 24.2 seconds — ABNORMAL HIGH (ref 11.4–15.2)

## 2018-08-30 MED ORDER — FUROSEMIDE 80 MG PO TABS
80.0000 mg | ORAL_TABLET | Freq: Two times a day (BID) | ORAL | Status: DC
  Administered 2018-08-30 – 2018-09-01 (×4): 80 mg via ORAL
  Filled 2018-08-30 (×4): qty 1

## 2018-08-30 MED ORDER — WARFARIN SODIUM 3 MG PO TABS: 3.0000 mg | ORAL_TABLET | Freq: Once | ORAL | Status: DC

## 2018-08-30 MED ORDER — POTASSIUM CHLORIDE CRYS ER 20 MEQ PO TBCR
20.0000 meq | EXTENDED_RELEASE_TABLET | Freq: Once | ORAL | Status: AC
  Administered 2018-08-30: 20 meq via ORAL
  Filled 2018-08-30: qty 1

## 2018-08-30 NOTE — Progress Notes (Signed)
ANTICOAGULATION CONSULT NOTE   Pharmacy Consult: Warfarin Indication: chest pain/ACS/LV apical thrombus  No Known Allergies  Patient Measurements: Height: 5\' 9"  (175.3 cm) Weight: 207 lb 14.3 oz (94.3 kg) IBW/kg (Calculated) : 70.7 Heparin Dosing Weight: 90 kg  Vital Signs: Temp: 97.6 F (36.4 C) (09/02 0003) Temp Source: Oral (09/02 0003) BP: 116/81 (09/02 0003) Pulse Rate: 72 (09/02 0003)  Labs: Recent Labs    08/28/18 0219 08/29/18 0210 08/30/18 0325  HGB 12.5* 12.6* 12.0*  HCT 38.2* 38.6* 37.1*  PLT 236 215 214  LABPROT 23.5* 25.2* 24.2*  INR 2.11 2.31 2.19  HEPARINUNFRC 0.19*  --   --   CREATININE 2.38* 2.49* 2.46*    Estimated Creatinine Clearance: 34.4 mL/min (A) (by C-G formula based on SCr of 2.46 mg/dL (H)).  Assessment: 33 YOM presents with 9 days of abdominal pain and increasing distension. Pt is s/p cath on 8/22 which showed multivessel CAD but not surgical/PCI candidate. She is noted with LV thrombus (now absent on repeat echo and chronic infarct. Patient was not on anticoagulation PTA.  S/p thoracentesis yesterday.  Warfarin held 8/29 and 8/30 d/t procedures planned, restarted 8/31. Heparin off with therapeutic INR of 2.1, CBC remains stable.   Discussed warfarin with patient yesterday.    Goal of Therapy:  Monitor platelets by anticoagulation protocol: Yes  INR 2-3   Plan:  Warfarin 3mg  tonight INR daily for now  Sheppard Coil PharmD., BCPS Clinical Pharmacist 08/30/2018 8:25 AM

## 2018-08-30 NOTE — Progress Notes (Signed)
Progress Note  Patient Name: Jose Myers Date of Encounter: 08/30/2018  Primary Cardiologist: Dr. [name]  Subjective   Patient denies CP   Breathing is good   Feels much better than last week  Sitting in chair   Shaved   Inpatient Medications    Scheduled Meds: . amiodarone  400 mg Oral BID  . aspirin EC  81 mg Oral Daily  . atorvastatin  40 mg Oral q1800  . carvedilol  3.125 mg Oral BID WC  . feeding supplement (ENSURE ENLIVE)  237 mL Oral BID BM  . insulin aspart  0-15 Units Subcutaneous TID WC  . insulin aspart  0-5 Units Subcutaneous QHS  . insulin glargine  10 Units Subcutaneous Daily  . omega-3 acid ethyl esters  1 g Oral QHS  . patient's guide to using coumadin book   Does not apply Once  . sertraline  25 mg Oral Daily  . Warfarin - Pharmacist Dosing Inpatient   Does not apply q1800   Continuous Infusions: . sodium chloride Stopped (08/26/18 1710)  . furosemide 120 mg (08/29/18 1806)   PRN Meds: sodium chloride, acetaminophen **OR** acetaminophen, acetaminophen-codeine, docusate sodium, fentaNYL, LORazepam, ondansetron (ZOFRAN) IV, polyethylene glycol, promethazine, RESOURCE THICKENUP CLEAR, sodium chloride flush, traZODone   Vital Signs    Vitals:   08/29/18 2015 08/29/18 2017 08/30/18 0003 08/30/18 0639  BP: 112/79  116/81   Pulse:   72   Resp:   (!) 23   Temp: 98.1 F (36.7 C)  97.6 F (36.4 C)   TempSrc: Oral  Oral   SpO2: (!) 86% 90% 91%   Weight:    94.3 kg  Height:        Intake/Output Summary (Last 24 hours) at 08/30/2018 0829 Last data filed at 08/30/2018 0600 Gross per 24 hour  Intake -  Output 1150 ml  Net -1150 ml   Filed Weights   08/28/18 0611 08/29/18 0300 08/30/18 0639  Weight: 93.4 kg 94.1 kg 94.3 kg    Telemetry    Sinus rhythm.  Personally reviewed.  Physical Exam   GEN: No acute distress.   Neck: No obvious JVD sitting in chair Cardiac: RRR, no gallop.  Respiratory: Nonlabored. Decreased BS at bases   GI: Soft, nontender, bowel sounds present. MS:Tr   edema; No deformity. Skin: Ecchymosis right lower flank. Neuro:  Nonfocal. Psych: Alert and oriented x 3. Normal affect.  Labs    Chemistry Recent Labs  Lab 08/27/18 0222 08/28/18 0219 08/29/18 0210 08/30/18 0325  NA 137 137 135 136  K 4.1 3.5 3.5 3.4*  CL 97* 98 96* 98  CO2 30 29 28 30   GLUCOSE 110* 153* 166* 120*  BUN 47* 43* 42* 40*  CREATININE 2.47* 2.38* 2.49* 2.46*  CALCIUM 8.8* 9.0 8.7* 8.4*  ALBUMIN 2.9* 2.9* 2.9*  --   GFRNONAA 26* 27* 26* 26*  GFRAA 30* 31* 30* 30*  ANIONGAP 10 10 11 8      Hematology Recent Labs  Lab 08/28/18 0219 08/29/18 0210 08/30/18 0325  WBC 9.2 9.8 12.2*  RBC 4.48 4.47 4.31  HGB 12.5* 12.6* 12.0*  HCT 38.2* 38.6* 37.1*  MCV 85.3 86.4 86.1  MCH 27.9 28.2 27.8  MCHC 32.7 32.6 32.3  RDW 14.0 14.4 14.3  PLT 236 215 214    Radiology    No results found.  Cardiac Studies   Echocardiogram 08/20/2018: Study Conclusions  - Left ventricle: Systolic function was severely reduced. The estimated ejection fraction  was in the range of 25% to 30%. Dyskinesis and scarring of the apical myocardium. Akinesis of the mid-apicalanteroseptal, anterior, and anterolateral myocardium; consistent with infarction in the distribution of the left anterior descending coronary artery; unchanged from the study of 08/18/2018. Doppler parameters are consistent with restrictive physiology, indicative of decreased left ventricular diastolic compliance and/or increased left atrial pressure. No evidence of thrombus. - Mitral valve: Calcified annulus. There was severe regurgitation directed centrally. The acceleration rate of the regurgitant jet was reduced, consistent with a low dP/dt. Severe regurgitation is suggested by pulmonary vein systolic flow reversal. - Left atrium: The atrium was severely dilated. - Tricuspid valve: There was mild-moderate regurgitation  directed centrally. - Pulmonary arteries: Systolic pressure was mildly increased. PA peak pressure: 43 mm Hg (S).  Impressions:  - Compared to the previous study images, the large LV apical thrombus is no longer present. Wall motion and EF are unchanged, but the mitral insufficiency appears worse.  Cardiac catheterization 08/19/2018:  Severe multivessel CAD including left main. Distal left main 85%.  Diffusely diseased and totally occluded LAD in the mid segment. Proximal 80 to 99% obstruction. Diagonals are totally occluded.  80% ostial circumflex, 90% proximal first obtuse marginal, and total occlusion of the circumflex beyond the first marginal. Diffuse disease in the first marginal.  Dominant right coronary with segmental 70% distal stenosis, 85% distal RCA beyond the PDA but before the first large left ventricular branch. Diffuse disease in the proximal to midportion of the large right ventricular branch and the second posterolateral branch.  Left ventriculography and hemodynamic recordings were not performed because of LV apical thrombus.  Recommendations:   TCTS evaluation for surgical revascularization.  Guideline directed therapy for systolic heart failure: SGLT2 therapy for diabetes, resume ARB but preferably Entresto for decreased LV function, beta-blocker therapy as tolerated, and diuresis as needed for symptom control.  Monitor kidney function closely.  Anticoagulation for acute coronary syndrome and LV thrombus  Patient Profile     62 y.o. male with a history of type 2 diabetes mellitus, recently documented severe multivessel CAD with poor revascularization options, cardiomyopathy with LVEF 25 to 30% and LV mural thrombus, VT arrest and PEA successfully resuscitated, acute renal failure, hyperlipidemia, and transient delirium.  Assessment & Plan    1.  Severe multivessel/left main CAD with poor revascularization options.  Surgery and  interventional have turned down due to diffuse severe dz.  Medical therapy is planned at this time.    2.  Status post VT arrest, currently on amiodarone.400 bid   Rare PVC  EP has seen pt  (Dr Ladona Ridgel)   WIll notify of his good progress.   Await final decision for ICD  EP  3.  Ischemic cardiomyopathy with LVEF 25 to 30%. Only on b blocker and lasix   Will swithc to 80 po bid   Follow I/O NOt on ACEI or ARB or spironolactone with renal problems BP has been marginal at times   Do not want to decreased end organ perfusion pressure    Hold for now until back on po lasix and responding  4.  LV mural thrombus   Present on initial echo   Gone on second echo   Currently on Coumadin.  INR greater 2   Hold tonight and recheck in am   EP to see  5.  Renal     Creatinine on admit 2.59 Improved to 1.6 a few days later      Today 2.46. ? If  some of decline related to lysed clot?  Renal has signed off  Not on ACE or ARB  Will switch to po lasix today  Follow I/O  6.  Mixed hyperlipidemia on Lipitor and Lovaza.  7.  Status post therapeutic right thoracentesis .Breathing is much improved   Continue ambulation, work with cardiac rehabilitation as tolerated.  Just started cardiac rehab 2 days ago    Signed, Dietrich Pates, MD  08/30/2018, 8:29 AM

## 2018-08-30 NOTE — Progress Notes (Signed)
Physical Therapy Treatment Patient Details Name: Jose Myers MRN: [id number] DOB: [birth year] Today's Date: 08/30/2018    History of Present Illness 62 y.o. male admitted 08/15/18 with NSTEMI, presenting with systolic CHF and acute renal insufficiency.  EF 25-30% with LV thrombus, not a surgical candidate. On 8/23, he went into v-tach arrest, CPR, epi x2,  defibrillated x1. Code stroke called on 8/24 for speech difficulty and confusion, MRI negative, CT showed possible cerebellum infarct, which neurology believes may be artifact.     PT Comments    Pt progressing well. Pt now steady to amb without AD and SpO2 >97% on 2LO2 via [place]. Pt with improved cognition and ambulation tolerance. Acute PT to cont to follow to progress dynamic balance and indep with mobility.    Follow Up Recommendations  Outpatient PT;Supervision for mobility/OOB     Equipment Recommendations  None recommended by PT    Recommendations for Other Services       Precautions / Restrictions Precautions Precautions: Fall Restrictions Weight Bearing Restrictions: No    Mobility  Bed Mobility               General bed mobility comments: pt received sitting up in chair  Transfers Overall transfer level: Needs assistance Equipment used: None Transfers: Sit to/from Stand Sit to Stand: Supervision         General transfer comment: pt pushed up from arm rest on chair, no difficulty, steady  Ambulation/Gait Ambulation/Gait assistance: Min guard Gait Distance (Feet): 500 Feet Assistive device: None Gait Pattern/deviations: Step-through pattern;Decreased stride length Gait velocity: average Gait velocity interpretation: 1.31 - 2.62 ft/sec, indicative of limited community ambulator General Gait Details: pt amb without AD, no episode of LOB or instability, pt with shortened step length, when asked to increase step length pt with increased instability   Stairs             Wheelchair Mobility     Modified Rankin (Stroke Patients Only)       Balance Overall balance assessment: Needs assistance Sitting-balance support: No upper extremity supported;Feet supported Sitting balance-Leahy Scale: Good       Standing balance-Leahy Scale: Good                              Cognition Arousal/Alertness: Awake/alert Behavior During Therapy: WFL for tasks assessed/performed Overall Cognitive Status: Within Functional Limits for tasks assessed                                 General Comments: pt particular about his schedule but less impulsive and increased ability to followed commands consistantly      Exercises      General Comments General comments (skin integrity, edema, etc.): SpO2 >97% on 2LO2 via [place]      Pertinent Vitals/Pain Pain Assessment: No/denies pain    Home Living                      Prior Function            PT Goals (current goals can now be found in the care plan section) Acute Rehab PT Goals Patient Stated Goal: walk Progress towards PT goals: Progressing toward goals    Frequency    Min 3X/week      PT Plan Current plan remains appropriate    Co-evaluation  AM-PAC PT "6 Clicks" Daily Activity  Outcome Measure  Difficulty turning over in bed (including adjusting bedclothes, sheets and blankets)?: None Difficulty moving from lying on back to sitting on the side of the bed? : None Difficulty sitting down on and standing up from a chair with arms (e.g., wheelchair, bedside commode, etc,.)?: None Help needed moving to and from a bed to chair (including a wheelchair)?: A Little Help needed walking in hospital room?: A Little Help needed climbing 3-5 steps with a railing? : A Little 6 Click Score: 21    End of Session         PT Visit Diagnosis: Unsteadiness on feet (R26.81)     Time: 3536-1443 PT Time Calculation (min) (ACUTE ONLY): 15 min  Charges:  $Gait Training: 8-22  mins                     Lewis Shock, PT, DPT Acute Rehabilitation Services Pager #: [phone] Office #: [phone]    Iona Hansen 08/30/2018, 1:57 PM

## 2018-08-31 ENCOUNTER — Encounter (HOSPITAL_COMMUNITY)
Admission: EM | Disposition: A | Discharge status: Home / Self Care | Payer: Self-pay | Source: Home / Self Care | Attending: Internal Medicine

## 2018-08-31 DIAGNOSIS — I255 Ischemic cardiomyopathy: Secondary | ICD-10-CM

## 2018-08-31 HISTORY — PX: ICD IMPLANT: EP1208

## 2018-08-31 LAB — PROTIME-INR
INR: 2.66
Prothrombin Time: 28.1 seconds — ABNORMAL HIGH (ref 11.4–15.2)

## 2018-08-31 LAB — GLUCOSE, CAPILLARY
GLUCOSE-CAPILLARY: 154 mg/dL — AB (ref 70–99)
GLUCOSE-CAPILLARY: 190 mg/dL — AB (ref 70–99)
GLUCOSE-CAPILLARY: 276 mg/dL — AB (ref 70–99)
Glucose-Capillary: 273 mg/dL — ABNORMAL HIGH (ref 70–99)

## 2018-08-31 LAB — CBC
HCT: 38.7 % — ABNORMAL LOW (ref 39.0–52.0)
HEMOGLOBIN: 12.2 g/dL — AB (ref 13.0–17.0)
MCH: 27.7 pg (ref 26.0–34.0)
MCHC: 31.5 g/dL (ref 30.0–36.0)
MCV: 88 fL (ref 78.0–100.0)
Platelets: 247 10*3/uL (ref 150–400)
RBC: 4.4 MIL/uL (ref 4.22–5.81)
RDW: 14.6 % (ref 11.5–15.5)
WBC: 10.7 10*3/uL — AB (ref 4.0–10.5)

## 2018-08-31 LAB — CREATININE, SERUM
CREATININE: 2.39 mg/dL — AB (ref 0.61–1.24)
GFR calc non Af Amer: 27 mL/min — ABNORMAL LOW (ref >60)
GFR, EST AFRICAN AMERICAN: 31 mL/min — AB (ref >60)

## 2018-08-31 SURGERY — ICD IMPLANT

## 2018-08-31 MED ORDER — FENTANYL CITRATE (PF) 100 MCG/2ML IJ SOLN
INTRAMUSCULAR | Status: AC
  Filled 2018-08-31: qty 2

## 2018-08-31 MED ORDER — FENTANYL CITRATE (PF) 100 MCG/2ML IJ SOLN
INTRAMUSCULAR | Status: DC | PRN
  Administered 2018-08-31: 12.5 ug via INTRAVENOUS

## 2018-08-31 MED ORDER — MIDAZOLAM HCL 5 MG/5ML IJ SOLN
INTRAMUSCULAR | Status: AC
  Filled 2018-08-31: qty 5

## 2018-08-31 MED ORDER — CEFAZOLIN SODIUM-DEXTROSE 2-4 GM/100ML-% IV SOLN
2.0000 g | INTRAVENOUS | Status: AC
  Administered 2018-08-31: 2 g via INTRAVENOUS
  Filled 2018-08-31: qty 100

## 2018-08-31 MED ORDER — POTASSIUM CHLORIDE CRYS ER 20 MEQ PO TBCR
40.0000 meq | EXTENDED_RELEASE_TABLET | Freq: Once | ORAL | Status: DC
  Filled 2018-08-31: qty 2

## 2018-08-31 MED ORDER — CEFAZOLIN SODIUM-DEXTROSE 1-4 GM/50ML-% IV SOLN: 1.0000 g | Freq: Four times a day (QID) | INTRAVENOUS | Status: DC

## 2018-08-31 MED ORDER — SODIUM CHLORIDE 0.9 % IV SOLN: INTRAVENOUS | Status: DC

## 2018-08-31 MED ORDER — SODIUM CHLORIDE 0.9 % IV SOLN
80.0000 mg | INTRAVENOUS | Status: AC
  Administered 2018-08-31: 80 mg

## 2018-08-31 MED ORDER — CEFAZOLIN SODIUM-DEXTROSE 1-4 GM/50ML-% IV SOLN
1.0000 g | Freq: Three times a day (TID) | INTRAVENOUS | Status: AC
  Administered 2018-08-31 – 2018-09-01 (×3): 1 g via INTRAVENOUS
  Filled 2018-08-31 (×3): qty 50

## 2018-08-31 MED ORDER — ACETAMINOPHEN 325 MG PO TABS: 325.0000 mg | ORAL_TABLET | ORAL | Status: DC | PRN

## 2018-08-31 MED ORDER — HEPARIN (PORCINE) IN NACL 1000-0.9 UT/500ML-% IV SOLN
INTRAVENOUS | Status: AC
  Filled 2018-08-31: qty 500

## 2018-08-31 MED ORDER — ONDANSETRON HCL 4 MG/2ML IJ SOLN: 4.0000 mg | Freq: Four times a day (QID) | INTRAMUSCULAR | Status: DC | PRN

## 2018-08-31 MED ORDER — GLUCERNA SHAKE PO LIQD
237.0000 mL | Freq: Two times a day (BID) | ORAL | Status: DC
  Administered 2018-09-02 – 2018-09-06 (×2): 237 mL via ORAL

## 2018-08-31 MED ORDER — ADULT MULTIVITAMIN W/MINERALS CH
1.0000 | ORAL_TABLET | Freq: Every day | ORAL | Status: DC
  Administered 2018-09-01 – 2018-09-06 (×6): 1 via ORAL
  Filled 2018-08-31 (×6): qty 1

## 2018-08-31 MED ORDER — WARFARIN SODIUM 2.5 MG PO TABS
2.5000 mg | ORAL_TABLET | Freq: Once | ORAL | Status: AC
  Administered 2018-08-31: 2.5 mg via ORAL
  Filled 2018-08-31: qty 1

## 2018-08-31 MED ORDER — HEPARIN (PORCINE) IN NACL 1000-0.9 UT/500ML-% IV SOLN
INTRAVENOUS | Status: DC | PRN
  Administered 2018-08-31: 500 mL

## 2018-08-31 MED ORDER — LIDOCAINE HCL (PF) 1 % IJ SOLN
INTRAMUSCULAR | Status: AC
  Filled 2018-08-31: qty 60

## 2018-08-31 MED ORDER — CHLORHEXIDINE GLUCONATE 4 % EX LIQD
60.0000 mL | Freq: Once | CUTANEOUS | Status: DC
  Filled 2018-08-31: qty 60

## 2018-08-31 MED ORDER — CEFAZOLIN SODIUM-DEXTROSE 2-4 GM/100ML-% IV SOLN
INTRAVENOUS | Status: AC
  Filled 2018-08-31: qty 100

## 2018-08-31 MED ORDER — WARFARIN - PHARMACIST DOSING INPATIENT
Freq: Every day | Status: DC
  Administered 2018-08-31: 18:00:00
  Administered 2018-09-02: 1

## 2018-08-31 MED ORDER — MIDAZOLAM HCL 5 MG/5ML IJ SOLN
INTRAMUSCULAR | Status: DC | PRN
  Administered 2018-08-31: 1 mg via INTRAVENOUS

## 2018-08-31 MED ORDER — SODIUM CHLORIDE 0.9 % IV SOLN
INTRAVENOUS | Status: AC
  Filled 2018-08-31: qty 2

## 2018-08-31 MED ORDER — LIDOCAINE HCL (PF) 1 % IJ SOLN
INTRAMUSCULAR | Status: DC | PRN
  Administered 2018-08-31: 45 mL

## 2018-08-31 MED ORDER — ENOXAPARIN SODIUM 30 MG/0.3ML ~~LOC~~ SOLN: 30.0000 mg | SUBCUTANEOUS | Status: DC

## 2018-08-31 MED ORDER — SODIUM CHLORIDE 0.9 % IV SOLN
INTRAVENOUS | Status: DC | PRN
  Administered 2018-08-31: 50 mL/h via INTRAVENOUS

## 2018-08-31 SURGICAL SUPPLY — 6 items
CABLE SURGICAL S-101-97-12 (CABLE) ×2 IMPLANT
ICD VISIA MRI DVFB1D1 (ICD Generator) ×1 IMPLANT
LEAD SPRINT QUAT SEC 6935-65CM (Lead) ×1 IMPLANT
PAD PRO RADIOLUCENT 2001M-C (PAD) ×2 IMPLANT
SHEATH CLASSIC 9F (SHEATH) ×1 IMPLANT
TRAY PACEMAKER INSERTION (PACKS) ×2 IMPLANT

## 2018-08-31 NOTE — Progress Notes (Signed)
Nutrition Follow-up  DOCUMENTATION CODES:   Obesity unspecified  INTERVENTION:   -Glucerna Shake po BID, each supplement provides 220 kcal and 10 grams of protein -MVI with minerals daily  NUTRITION DIAGNOSIS:   Increased nutrient needs related to chronic illness(CHF) as evidenced by estimated needs.  Ongoing  GOAL:   Patient will meet greater than or equal to 90% of their needs  Progressing  MONITOR:   PO intake, Supplement acceptance, Labs, Weight trends, Skin, I & O's  REASON FOR ASSESSMENT:   Consult Assessment of nutrition requirement/status  ASSESSMENT:   62 year old male who presented to the ED on 8/18 with abdominal pain and leg swelling. PMH significant for hypertension, CAD s/p stenting, and type 2 diabetes mellitus. Pt found to have new onset CHF and non-STEMI.  8/22 Cardiac Cath: multivessel CAD but not surgical/PCI candidate 8/23 VT/VF arrest, intubated, transferred to ICU 8/24 Extubated 8/30 rt thoracentesis (1.4 L removed)  Reviewed I/O's: -1.1 L x 24 hours and -5.8 L since 08/17/18  Pt NPO for ICD placement today.   Pt not in room at time of visit (in cath lab). No family available.   Pt with improving appetite; noted meal completion 50-75%. Pt is refusing Ensure supplements.   Labs reviewed: CBGS: 154-265 (inpatient orders for glycemic control are 0-15 units insulin aspart TID with meals, 0-5 units insulin aspart q HS and 10 units insulin glargine daily).  Diet Order:   Diet Order            Diet Heart Room service appropriate? Yes; Fluid consistency: Thin  Diet effective now              EDUCATION NEEDS:   Education needs have been addressed  Skin:  Skin Assessment: Reviewed RN Assessment  Last BM:  08/28/18  Height:   Ht Readings from Last 1 Encounters:  08/15/18 5\' 9"  (1.753 m)    Weight:   Wt Readings from Last 1 Encounters:  08/31/18 94.5 kg    Ideal Body Weight:  72.73 kg  BMI:  Body mass index is 30.77  kg/m.  Estimated Nutritional Needs:   Kcal:  1900-2200 kcals   Protein:  95-110 g   Fluid:  >/= 1.9 L    Walker Paddack A. Mayford Knife, RD, LDN, CDE Pager: [phone] After hours Pager: [phone]

## 2018-08-31 NOTE — Progress Notes (Signed)
.    Awaiting plan per EP.  Discussed with their service today.  Home if no plans for ICD.

## 2018-08-31 NOTE — H&P (View-Only) (Signed)
Progress Note  Patient Name: Jose Myers Date of Encounter: 08/31/2018  Primary Cardiologist: [name], MD   Subjective   Feels so much better then a week ago.  No CP, denies SOB, reports ambulating in the halls without difficulty  Inpatient Medications    Scheduled Meds: . amiodarone  400 mg Oral BID  . aspirin EC  81 mg Oral Daily  . atorvastatin  40 mg Oral q1800  . carvedilol  3.125 mg Oral BID WC  . feeding supplement (ENSURE ENLIVE)  237 mL Oral BID BM  . furosemide  80 mg Oral BID  . insulin aspart  0-15 Units Subcutaneous TID WC  . insulin aspart  0-5 Units Subcutaneous QHS  . insulin glargine  10 Units Subcutaneous Daily  . omega-3 acid ethyl esters  1 g Oral QHS  . patient's guide to using coumadin book   Does not apply Once  . sertraline  25 mg Oral Daily   Continuous Infusions: . sodium chloride Stopped (08/26/18 1710)   PRN Meds: sodium chloride, acetaminophen **OR** acetaminophen, acetaminophen-codeine, docusate sodium, fentaNYL, LORazepam, ondansetron (ZOFRAN) IV, polyethylene glycol, promethazine, RESOURCE THICKENUP CLEAR, sodium chloride flush, traZODone   Vital Signs    Vitals:   08/30/18 1942 08/30/18 2258 08/31/18 0234 08/31/18 0500  BP: 122/81 95/70 113/88   Pulse: 73 73 78   Resp: (!) 22 (!) 22 18   Temp: 97.7 F (36.5 C) 97.6 F (36.4 C) 97.9 F (36.6 C)   TempSrc: Oral Oral Oral   SpO2: 93% 92% 98%   Weight:    94.5 kg  Height:        Intake/Output Summary (Last 24 hours) at 08/31/2018 0726 Last data filed at 08/31/2018 0550 Gross per 24 hour  Intake -  Output 1075 ml  Net -1075 ml   Filed Weights   08/29/18 0300 08/30/18 0639 08/31/18 0500  Weight: 94.1 kg 94.3 kg 94.5 kg    Telemetry    SR, infrequent PVC, rare NSVT (3 beats) - Personally Reviewed  ECG    No new EKGs - Personally Reviewed  Physical Exam   GEN: No acute distress.   Neck: No JVD Cardiac: RRR, no murmurs, rubs, or gallops.    Respiratory: diminished at the bases bilaterally, no rales, rhonchi, or wheezes, noted area of ecchymosis R flank GI: Soft, nontender, non-distended  MS: 2+ edema; No deformity. Neuro:  Nonfocal  Psych: Normal affect   Labs    Chemistry Recent Labs  Lab 08/27/18 0222 08/28/18 0219 08/29/18 0210 08/30/18 0325  NA 137 137 135 136  K 4.1 3.5 3.5 3.4*  CL 97* 98 96* 98  CO2 30 29 28 30   GLUCOSE 110* 153* 166* 120*  BUN 47* 43* 42* 40*  CREATININE 2.47* 2.38* 2.49* 2.46*  CALCIUM 8.8* 9.0 8.7* 8.4*  ALBUMIN 2.9* 2.9* 2.9*  --   GFRNONAA 26* 27* 26* 26*  GFRAA 30* 31* 30* 30*  ANIONGAP 10 10 11 8      Hematology Recent Labs  Lab 08/28/18 0219 08/29/18 0210 08/30/18 0325  WBC 9.2 9.8 12.2*  RBC 4.48 4.47 4.31  HGB 12.5* 12.6* 12.0*  HCT 38.2* 38.6* 37.1*  MCV 85.3 86.4 86.1  MCH 27.9 28.2 27.8  MCHC 32.7 32.6 32.3  RDW 14.0 14.4 14.3  PLT 236 215 214    Cardiac EnzymesNo results for input(s): TROPONINI in the last 168 hours. No results for input(s): TROPIPOC in the last 168 hours.   BNPNo results  for input(s): BNP, PROBNP in the last 168 hours.   DDimer No results for input(s): DDIMER in the last 168 hours.   Radiology    No results found.  Cardiac Studies   08/20/18: TTE Study Conclusions - Left ventricle: Systolic function was severely reduced. The estimated ejection fraction was in the range of 25% to 30%. Dyskinesis and scarring of the apical myocardium. Akinesis of the mid-apicalanteroseptal, anterior, and anterolateral myocardium; consistent with infarction in the distribution of the left anterior descending coronary artery; unchanged from the study of 08/18/2018. Doppler parameters are consistent with restrictive physiology, indicative of decreased left ventricular diastolic compliance and/or increased left atrial pressure. No evidence of thrombus. - Mitral valve: Calcified annulus. There was severe regurgitation directed  centrally. The acceleration rate of the regurgitant jet was reduced, consistent with a low dP/dt. Severe regurgitation is suggested by pulmonary vein systolic flow reversal. - Left atrium: The atrium was severely dilated. - Tricuspid valve: There was mild-moderate regurgitation directed centrally. - Pulmonary arteries: Systolic pressure was mildly increased. PA peak pressure: 43 mm Hg (S). Impressions: - Compared to the previous study images, the large LV apical thrombus is no longer present. Wall motion and EF are unchanged, but the mitral insufficiency appears worse.   08/19/18: LHC  Severe multivessel CAD including left main. Distal left main 85%.  Diffusely diseased and totally occluded LAD in the mid segment. Proximal 80 to 99% obstruction. Diagonals are totally occluded.  80% ostial circumflex, 90% proximal first obtuse marginal, and total occlusion of the circumflex beyond the first marginal. Diffuse disease in the first marginal.  Dominant right coronary with segmental 70% distal stenosis, 85% distal RCA beyond the PDA but before the first large left ventricular branch. Diffuse disease in the proximal to midportion of the large right ventricular branch and the second posterolateral branch.  Left ventriculography and hemodynamic recordings were not performed because of LV apical thrombus.  Recommendations:   T CTS evaluation for surgical revascularization.  Guideline directed therapy for systolic heart failure: SGLT2 therapy for diabetes, resume ARB but preferably Entresto for decreased LV function, beta-blocker therapy as tolerated, and diuresis as needed for symptom control.  Monitor kidney function closely.  Anticoagulation for acute coronary syndrome and LV thrombus  Patient Profile     62 y.o. male with a hx of CAD, DM, HTN   was admitted 08/15/18 with c/o abdominal pain noted abn Trop and EKG, evidence of fluid OL, and AKI.  Ruled in for  NSTEMI, planned for cath pending renal function improvement, and able to tolerate from CHF perspective.  TTE done, noted LVEF 25-30% and LV apical thrombus (already on heparin gtt for NSTEM).  He underwent cath 08/19/18 noting severe multivessel CAD, including LM disease, CTS evaluation was done and felt to have poor target vessels and complete revascularization not possible, found to be too high risk for CABG, with preference to consider PCI to CABG, noting that Impella would not be an option given LV thrombus.  08/20/18 suffered a VT arrest, requiring CPR,  2 rounds of epi, 2 defibrillations, intubation, code time was 16 minutes with 6 minutes of ROSC in between. He was given Mag 2 grams, and bolused with amiodarone and started on gtt. Ultimately CTS decided not a CABG candidate, and if meaningful recovery from arrest, to consider high-risk PCI. Post arrest TTE 08/20/18 noted apical thrombus was gone, other LVEF remained same extubated 08/21/18  08/22/18, code stroke called for acute AMS, neuro though most c/w delirium, though  noted possible subacute cerebellar stroke by imaging(likelly artifact by their notes) and normal brain MRI done only an half hour prior to that. Nephrology consulted with worsening renal function, not felt will need dialysis, with improving urine OP furosemide challenge.  08/24/18, cardiology notes in discussion/review of films with interventional cardiologists, not felt to be a PCI candidate.  08/25/18 EP is asked to weigh in on ICD, timing pending clinical course  08/27/18, R thoracentesis 1/4L  Assessment & Plan    1. NSTEMI 2. 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 S/p cardiac arrest     Now on PO amiodarone  4. AKI     BUN/Creat yesterday 40/2.46 (about where he has been for last several days)  5. ICM     Diminished breath sounds at bases, he remains edematous     Fluid negative in last 7 days cumulatively -     Ongoing management with primary  cardiology team  6. ICM w/LV thrombus >> gone post arrest     Not felt to have suffered CVA by neuro notes     Planned for lifelong a/c with risk of recurrance given WMA,mid-apicalanteroseptal, anterior, and anterolateral myocardium; consistent with infarction       INR today 2.66  7. Leukocytosis, WBC today is 12.2      Afebrile     Denies symptoms of illness  I mentioned to the patient we were coming back around to discuss perhaps ICD implant maybe today given he is doing so much better.  He does not really want to consider this today, mentions he just wants to go home and can not see another day here.  He would like to go home and have device out patient.  I recommend that we discuss it further, asked him not to eat, he is willing to discuss it with Dr. Ladona Ridgel further this morning, but again mentions he wants to go home today.  I will defer further with Dr. Ladona Ridgel.   For questions or updates, please contact [facility] Please consult [email or web address] for contact info under Cardiology/STEMI.      Signed, Sheilah Pigeon, PA-C  08/31/2018, 7:26 AM    EP Attending  Patient seen and examined. Agree with above. He is much better compared to a week ago. His renal function has improved. I have discussed the treatment options with the patient and the risks/benefits/goals/expectations of ICD insertion were reviewed. He wishes to proceed. His warfarin was stopped yesterday. He has no clinical signs of infection.   Leonia Reeves.D.

## 2018-08-31 NOTE — Progress Notes (Addendum)
OT Cancellation Note  Patient Details Name: Jose Myers MRN: [id number] DOB: [birth year]   Cancelled Treatment:    Reason Eval/Treat Not Completed: Other (comment). Pt declining therapy stating "I am just anxious about having surgery, and I am tired of being nice." Pt reporting he would be willing to try again after his procedure. RN reporting pt is having defibrillator placed this AM. Will return as schedule allows. Thank you.  Returned for second attempt at 15:56: pt stating he is too dizzy and sleep for therapy. Assisted pt in adjusting his sling to proper position to optimize safety. Will return as schedule allows. Thank you.  Adalina Dopson M Jiah Bari Breklyn Fabrizio MSOT, OTR/L Acute Rehab Pager: [phone] Office: [phone] 08/31/2018, 8:22 AM

## 2018-08-31 NOTE — Progress Notes (Signed)
ANTICOAGULATION CONSULT NOTE   Pharmacy Consult: Warfarin Indication: chest pain/ACS/LV apical thrombus  No Known Allergies  Patient Measurements: Height: 5\' 9"  (175.3 cm) Weight: 208 lb 5.4 oz (94.5 kg) IBW/kg (Calculated) : 70.7 Heparin Dosing Weight: 90 kg  Vital Signs: Temp: 97.8 F (36.6 C) (09/03 0700) Temp Source: Oral (09/03 0234) BP: 124/89 (09/03 1217) Pulse Rate: 0 (09/03 1217)  Labs: Recent Labs    08/29/18 0210 08/30/18 0325 08/31/18 0232  HGB 12.6* 12.0*  --   HCT 38.6* 37.1*  --   PLT 215 214  --   LABPROT 25.2* 24.2* 28.1*  INR 2.31 2.19 2.66  CREATININE 2.49* 2.46*  --     Estimated Creatinine Clearance: 34.4 mL/min (A) (by C-G formula based on SCr of 2.46 mg/dL (H)).  Assessment: 3 YOM presents with 9 days of abdominal pain and increasing distension. Pt is s/p cath on 8/22 which showed multivessel CAD but not surgical/PCI candidate. She is noted with LV thrombus (now absent on repeat echo and chronic infarct). Patient was not on anticoagulation PTA.  S/p thoracentesis yesterday.  Warfarin held 8/29 and 8/30 d/t procedures planned, restarted 8/31. Dose held last night for ICD placement on 9/3- INR today is 2.66. Hgb 12, plt 214. Remains on concurrent amiodarone.      Goal of Therapy:  Monitor platelets by anticoagulation protocol: Yes  INR 2-3   Plan:  Warfarin 2.5 mg tonight INR daily   Girard Cooter, PharmD Clinical Pharmacist  Pager: [phone] Phone: [phone] 08/31/2018 1:39 PM

## 2018-08-31 NOTE — Interval H&P Note (Signed)
History and Physical Interval Note:  08/31/2018 9:48 AM  Jose Myers  has presented today for surgery, with the diagnosis of hb  The various methods of treatment have been discussed with the patient and family. After consideration of risks, benefits and other options for treatment, the patient has consented to  Procedure(s): ICD IMPLANT (N/A) as a surgical intervention .  The patient's history has been reviewed, patient examined, no change in status, stable for surgery.  I have reviewed the patient's chart and labs.  Questions were answered to the patient's satisfaction.     Jose Myers

## 2018-08-31 NOTE — Progress Notes (Addendum)
Progress Note  Patient Name: Jose Myers Date of Encounter: 08/31/2018  Primary Cardiologist: [name], MD   Subjective   Feels so much better then a week ago.  No CP, denies SOB, reports ambulating in the halls without difficulty  Inpatient Medications    Scheduled Meds: . amiodarone  400 mg Oral BID  . aspirin EC  81 mg Oral Daily  . atorvastatin  40 mg Oral q1800  . carvedilol  3.125 mg Oral BID WC  . feeding supplement (ENSURE ENLIVE)  237 mL Oral BID BM  . furosemide  80 mg Oral BID  . insulin aspart  0-15 Units Subcutaneous TID WC  . insulin aspart  0-5 Units Subcutaneous QHS  . insulin glargine  10 Units Subcutaneous Daily  . omega-3 acid ethyl esters  1 g Oral QHS  . patient's guide to using coumadin book   Does not apply Once  . sertraline  25 mg Oral Daily   Continuous Infusions: . sodium chloride Stopped (08/26/18 1710)   PRN Meds: sodium chloride, acetaminophen **OR** acetaminophen, acetaminophen-codeine, docusate sodium, fentaNYL, LORazepam, ondansetron (ZOFRAN) IV, polyethylene glycol, promethazine, RESOURCE THICKENUP CLEAR, sodium chloride flush, traZODone   Vital Signs    Vitals:   08/30/18 1942 08/30/18 2258 08/31/18 0234 08/31/18 0500  BP: 122/81 95/70 113/88   Pulse: 73 73 78   Resp: (!) 22 (!) 22 18   Temp: 97.7 F (36.5 C) 97.6 F (36.4 C) 97.9 F (36.6 C)   TempSrc: Oral Oral Oral   SpO2: 93% 92% 98%   Weight:    94.5 kg  Height:        Intake/Output Summary (Last 24 hours) at 08/31/2018 0726 Last data filed at 08/31/2018 0550 Gross per 24 hour  Intake -  Output 1075 ml  Net -1075 ml   Filed Weights   08/29/18 0300 08/30/18 0639 08/31/18 0500  Weight: 94.1 kg 94.3 kg 94.5 kg    Telemetry    SR, infrequent PVC, rare NSVT (3 beats) - Personally Reviewed  ECG    No new EKGs - Personally Reviewed  Physical Exam   GEN: No acute distress.   Neck: No JVD Cardiac: RRR, no murmurs, rubs, or gallops.    Respiratory: diminished at the bases bilaterally, no rales, rhonchi, or wheezes, noted area of ecchymosis R flank GI: Soft, nontender, non-distended  MS: 2+ edema; No deformity. Neuro:  Nonfocal  Psych: Normal affect   Labs    Chemistry Recent Labs  Lab 08/27/18 0222 08/28/18 0219 08/29/18 0210 08/30/18 0325  NA 137 137 135 136  K 4.1 3.5 3.5 3.4*  CL 97* 98 96* 98  CO2 30 29 28 30   GLUCOSE 110* 153* 166* 120*  BUN 47* 43* 42* 40*  CREATININE 2.47* 2.38* 2.49* 2.46*  CALCIUM 8.8* 9.0 8.7* 8.4*  ALBUMIN 2.9* 2.9* 2.9*  --   GFRNONAA 26* 27* 26* 26*  GFRAA 30* 31* 30* 30*  ANIONGAP 10 10 11 8      Hematology Recent Labs  Lab 08/28/18 0219 08/29/18 0210 08/30/18 0325  WBC 9.2 9.8 12.2*  RBC 4.48 4.47 4.31  HGB 12.5* 12.6* 12.0*  HCT 38.2* 38.6* 37.1*  MCV 85.3 86.4 86.1  MCH 27.9 28.2 27.8  MCHC 32.7 32.6 32.3  RDW 14.0 14.4 14.3  PLT 236 215 214    Cardiac EnzymesNo results for input(s): TROPONINI in the last 168 hours. No results for input(s): TROPIPOC in the last 168 hours.   BNPNo results  for input(s): BNP, PROBNP in the last 168 hours.   DDimer No results for input(s): DDIMER in the last 168 hours.   Radiology    No results found.  Cardiac Studies   08/20/18: TTE Study Conclusions - Left ventricle: Systolic function was severely reduced. The estimated ejection fraction was in the range of 25% to 30%. Dyskinesis and scarring of the apical myocardium. Akinesis of the mid-apicalanteroseptal, anterior, and anterolateral myocardium; consistent with infarction in the distribution of the left anterior descending coronary artery; unchanged from the study of 08/18/2018. Doppler parameters are consistent with restrictive physiology, indicative of decreased left ventricular diastolic compliance and/or increased left atrial pressure. No evidence of thrombus. - Mitral valve: Calcified annulus. There was severe regurgitation directed  centrally. The acceleration rate of the regurgitant jet was reduced, consistent with a low dP/dt. Severe regurgitation is suggested by pulmonary vein systolic flow reversal. - Left atrium: The atrium was severely dilated. - Tricuspid valve: There was mild-moderate regurgitation directed centrally. - Pulmonary arteries: Systolic pressure was mildly increased. PA peak pressure: 43 mm Hg (S). Impressions: - Compared to the previous study images, the large LV apical thrombus is no longer present. Wall motion and EF are unchanged, but the mitral insufficiency appears worse.   08/19/18: LHC  Severe multivessel CAD including left main. Distal left main 85%.  Diffusely diseased and totally occluded LAD in the mid segment. Proximal 80 to 99% obstruction. Diagonals are totally occluded.  80% ostial circumflex, 90% proximal first obtuse marginal, and total occlusion of the circumflex beyond the first marginal. Diffuse disease in the first marginal.  Dominant right coronary with segmental 70% distal stenosis, 85% distal RCA beyond the PDA but before the first large left ventricular branch. Diffuse disease in the proximal to midportion of the large right ventricular branch and the second posterolateral branch.  Left ventriculography and hemodynamic recordings were not performed because of LV apical thrombus.  Recommendations:   T CTS evaluation for surgical revascularization.  Guideline directed therapy for systolic heart failure: SGLT2 therapy for diabetes, resume ARB but preferably Entresto for decreased LV function, beta-blocker therapy as tolerated, and diuresis as needed for symptom control.  Monitor kidney function closely.  Anticoagulation for acute coronary syndrome and LV thrombus  Patient Profile     62 y.o. male with a hx of CAD, DM, HTN   was admitted 08/15/18 with c/o abdominal pain noted abn Trop and EKG, evidence of fluid OL, and AKI.  Ruled in for  NSTEMI, planned for cath pending renal function improvement, and able to tolerate from CHF perspective.  TTE done, noted LVEF 25-30% and LV apical thrombus (already on heparin gtt for NSTEM).  He underwent cath 08/19/18 noting severe multivessel CAD, including LM disease, CTS evaluation was done and felt to have poor target vessels and complete revascularization not possible, found to be too high risk for CABG, with preference to consider PCI to CABG, noting that Impella would not be an option given LV thrombus.  08/20/18 suffered a VT arrest, requiring CPR,  2 rounds of epi, 2 defibrillations, intubation, code time was 16 minutes with 6 minutes of ROSC in between. He was given Mag 2 grams, and bolused with amiodarone and started on gtt. Ultimately CTS decided not a CABG candidate, and if meaningful recovery from arrest, to consider high-risk PCI. Post arrest TTE 08/20/18 noted apical thrombus was gone, other LVEF remained same extubated 08/21/18  08/22/18, code stroke called for acute AMS, neuro though most c/w delirium, though  noted possible subacute cerebellar stroke by imaging(likelly artifact by their notes) and normal brain MRI done only an half hour prior to that. Nephrology consulted with worsening renal function, not felt will need dialysis, with improving urine OP furosemide challenge.  08/24/18, cardiology notes in discussion/review of films with interventional cardiologists, not felt to be a PCI candidate.  08/25/18 EP is asked to weigh in on ICD, timing pending clinical course  08/27/18, R thoracentesis 1/4L  Assessment & Plan    1. NSTEMI 2. 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 S/p cardiac arrest     Now on PO amiodarone  4. AKI     BUN/Creat yesterday 40/2.46 (about where he has been for last several days)  5. ICM     Diminished breath sounds at bases, he remains edematous     Fluid negative in last 7 days cumulatively -     Ongoing management with primary  cardiology team  6. ICM w/LV thrombus >> gone post arrest     Not felt to have suffered CVA by neuro notes     Planned for lifelong a/c with risk of recurrance given WMA,mid-apicalanteroseptal, anterior, and anterolateral myocardium; consistent with infarction       INR today 2.66  7. Leukocytosis, WBC today is 12.2      Afebrile     Denies symptoms of illness  I mentioned to the patient we were coming back around to discuss perhaps ICD implant maybe today given he is doing so much better.  He does not really want to consider this today, mentions he just wants to go home and can not see another day here.  He would like to go home and have device out patient.  I recommend that we discuss it further, asked him not to eat, he is willing to discuss it with Dr. Ladona Ridgel further this morning, but again mentions he wants to go home today.  I will defer further with Dr. Ladona Ridgel.   For questions or updates, please contact [facility] Please consult [email or web address] for contact info under Cardiology/STEMI.      Signed, Sheilah Pigeon, PA-C  08/31/2018, 7:26 AM    EP Attending  Patient seen and examined. Agree with above. He is much better compared to a week ago. His renal function has improved. I have discussed the treatment options with the patient and the risks/benefits/goals/expectations of ICD insertion were reviewed. He wishes to proceed. His warfarin was stopped yesterday. He has no clinical signs of infection.   Leonia Reeves.D.

## 2018-09-01 ENCOUNTER — Encounter (HOSPITAL_COMMUNITY): Payer: Self-pay | Admitting: Internal Medicine

## 2018-09-01 ENCOUNTER — Inpatient Hospital Stay (HOSPITAL_COMMUNITY): Payer: BLUE CROSS/BLUE SHIELD

## 2018-09-01 LAB — GLUCOSE, CAPILLARY
Glucose-Capillary: 141 mg/dL — ABNORMAL HIGH (ref 70–99)
Glucose-Capillary: 268 mg/dL — ABNORMAL HIGH (ref 70–99)
Glucose-Capillary: 253 mg/dL — ABNORMAL HIGH (ref 70–99)
Glucose-Capillary: 142 mg/dL — ABNORMAL HIGH (ref 70–99)

## 2018-09-01 LAB — PROTIME-INR
INR: 3.82
Prothrombin Time: 37.4 seconds — ABNORMAL HIGH (ref 11.4–15.2)

## 2018-09-01 MED ORDER — FUROSEMIDE 10 MG/ML IJ SOLN
80.0000 mg | Freq: Two times a day (BID) | INTRAMUSCULAR | Status: DC
  Administered 2018-09-01 – 2018-09-03 (×6): 80 mg via INTRAVENOUS
  Filled 2018-09-01 (×4): qty 8

## 2018-09-01 MED ORDER — FUROSEMIDE 10 MG/ML IJ SOLN
INTRAMUSCULAR | Status: AC
  Administered 2018-09-01: 80 mg via INTRAVENOUS
  Filled 2018-09-01: qty 8

## 2018-09-01 MED ORDER — AMIODARONE HCL 200 MG PO TABS
200.0000 mg | ORAL_TABLET | Freq: Two times a day (BID) | ORAL | Status: DC
  Administered 2018-09-01 – 2018-09-06 (×11): 200 mg via ORAL
  Filled 2018-09-01 (×11): qty 1

## 2018-09-01 MED ORDER — VITAMIN K1 10 MG/ML IJ SOLN
1.0000 mg | Freq: Once | INTRAVENOUS | Status: AC
  Administered 2018-09-01: 1 mg via INTRAVENOUS
  Filled 2018-09-01: qty 0.5

## 2018-09-01 NOTE — Progress Notes (Signed)
SATURATION QUALIFICATIONS: (This note is used to comply with regulatory documentation for home oxygen)  Patient Saturations on Room Air at Rest = 88%  Patient Saturations on Room Air while Ambulating = 82%  Patient Saturations on 2 Liters of oxygen while Ambulating = 88% Increased oxygen to 3l/min sats increased to 92% At rest on 3L/min sats increased to 94% Please briefly explain why patient needs home oxygen:

## 2018-09-01 NOTE — Care Management Note (Signed)
Case Management Note Previous Note created by Jiles Crocker   Patient Details  Name: Jose Myers MRN: [id number] Date of Birth: 02-22-1956  Subjective/Objective:    NSTEMI               Action/Plan: Patient lives at home with spouse; PCP: Daisy Floro, MD; has private insurance with BCBS with prescription drug coverage; Multivessel Disease, LV Thrombus. CM will continue to follow for progression of care.  Expected Discharge Date:  Undetermined at this time, pt being transferred to the ICU              Expected Discharge Plan:   TBD  Status of Service:   In progress  09/01/2018 Pt will need oxygen at discharge - amb note in [facility] given pending referral - CM requested order.  Per bedside nurse pt is ambulatory in room. Samuel Bouche [phone] 09/01/2018, 1:10 PM

## 2018-09-01 NOTE — Progress Notes (Signed)
ANTICOAGULATION CONSULT NOTE   Pharmacy Consult: Warfarin Indication: chest pain/ACS/LV apical thrombus  No Known Allergies  Patient Measurements: Height: 5\' 9"  (175.3 cm) Weight: 208 lb 5.4 oz (94.5 kg) IBW/kg (Calculated) : 70.7 Heparin Dosing Weight: 90 kg  Vital Signs: Temp: 97.9 F (36.6 C) (09/04 0718) Temp Source: Oral (09/04 0718) BP: 104/75 (09/04 0718)  Labs: Recent Labs    08/30/18 0325 08/31/18 0232 08/31/18 1330 09/01/18 0326  HGB 12.0*  --  12.2*  --   HCT 37.1*  --  38.7*  --   PLT 214  --  247  --   LABPROT 24.2* 28.1*  --  37.4*  INR 2.19 2.66  --  3.82  CREATININE 2.46*  --  2.39*  --     Estimated Creatinine Clearance: 35.4 mL/min (A) (by C-G formula based on SCr of 2.39 mg/dL (H)).  Assessment: Jose Myers presents with 9 days of abdominal pain and increasing distension. Pt is s/p cath on 8/22 which showed multivessel CAD but not surgical/PCI candidate. She is noted with LV thrombus (now absent on repeat echo and chronic infarct). Patient was not on anticoagulation PTA.  S/p thoracentesis yesterday.  Warfarin held 8/29 and 8/30 d/t procedures planned, restarted 8/31. S/p ICD placement on 9/3- INR today 2.66>3.82 after small dose warfarin 2.5mg  x1 last pm.   Hgb 12, plt 214. Remains on concurrent amiodarone.      Goal of Therapy:  Monitor platelets by anticoagulation protocol: Yes  INR 2-3   Plan:  Hold warfarin tonight  INR daily   Leota Sauers Pharm.D. CPP, BCPS Clinical Pharmacist [phone] 09/01/2018 10:16 AM

## 2018-09-01 NOTE — Progress Notes (Signed)
Physical Therapy Treatment Patient Details Name: Jose Myers MRN: [id number] DOB: March 02, 1956 Today's Date: 09/01/2018    History of Present Illness 62 y.o. male admitted 08/15/18 with NSTEMI, presenting with systolic CHF and acute renal insufficiency.  EF 25-30% with LV thrombus, not a surgical candidate. On 8/23, he went into v-tach arrest, CPR, epi x2,  defibrillated x1. Code stroke called on 8/24 for speech difficulty and confusion, MRI negative, CT showed possible cerebellum infarct, which neurology believes may be artifact.     PT Comments    Pt able to tolerate < 500' amb without AD however SpO2 dec into 80s on 4LO2 via [place]. Pt unable to increased step length or velocity due to increased SOB. Acute PT to cont to follow.    Follow Up Recommendations  Outpatient PT;Supervision for mobility/OOB     Equipment Recommendations  None recommended by PT    Recommendations for Other Services       Precautions / Restrictions Precautions Precautions: Fall Restrictions Weight Bearing Restrictions: No    Mobility  Bed Mobility               General bed mobility comments: pt received sitting EOB  Transfers Overall transfer level: Needs assistance Equipment used: None Transfers: Sit to/from Stand Sit to Stand: Supervision         General transfer comment: pt with good transfer  Ambulation/Gait Ambulation/Gait assistance: Min guard Gait Distance (Feet): 600 Feet Assistive device: None Gait Pattern/deviations: Step-through pattern;Decreased stride length Gait velocity: ave   General Gait Details: no standing rest break despite fatigue and mild SOB, SpO2 >87% on 4LO2 via [place], encouraged pt to increase step length however pt with increased fatigue and unable to maintain   Stairs             Wheelchair Mobility    Modified Rankin (Stroke Patients Only)       Balance Overall balance assessment: Needs assistance Sitting-balance support: No upper  extremity supported;Feet supported Sitting balance-Leahy Scale: Good       Standing balance-Leahy Scale: Good                              Cognition Arousal/Alertness: Awake/alert Behavior During Therapy: WFL for tasks assessed/performed Overall Cognitive Status: Within Functional Limits for tasks assessed                                        Exercises      General Comments        Pertinent Vitals/Pain Pain Assessment: No/denies pain    Home Living                      Prior Function            PT Goals (current goals can now be found in the care plan section) Acute Rehab PT Goals Patient Stated Goal: walk Progress towards PT goals: Progressing toward goals    Frequency    Min 3X/week      PT Plan Current plan remains appropriate    Co-evaluation              AM-PAC PT "6 Clicks" Daily Activity  Outcome Measure  Difficulty turning over in bed (including adjusting bedclothes, sheets and blankets)?: None Difficulty moving from lying on back to sitting on the side of  the bed? : None Difficulty sitting down on and standing up from a chair with arms (e.g., wheelchair, bedside commode, etc,.)?: None Help needed moving to and from a bed to chair (including a wheelchair)?: A Little Help needed walking in hospital room?: A Little Help needed climbing 3-5 steps with a railing? : A Little 6 Click Score: 21    End of Session Equipment Utilized During Treatment: Gait belt;Oxygen Activity Tolerance: Patient tolerated treatment well;Patient limited by fatigue Patient left: in bed;with call bell/phone within reach;with family/visitor present(sitting EOB) Nurse Communication: Mobility status PT Visit Diagnosis: Unsteadiness on feet (R26.81)     Time: 1610-9604 PT Time Calculation (min) (ACUTE ONLY): 18 min  Charges:  $Gait Training: 8-22 mins                     Lewis Shock, PT, DPT Acute Rehabilitation  Services Pager #: [phone] Office #: [phone]    Iona Hansen 09/01/2018, 2:28 PM

## 2018-09-01 NOTE — Plan of Care (Signed)
  Problem: Health Behavior/Discharge Planning: Goal: Ability to manage health-related needs will improve Outcome: Progressing   Problem: Clinical Measurements: Goal: Ability to maintain clinical measurements within normal limits will improve Outcome: Progressing Goal: Diagnostic test results will improve Outcome: Progressing Goal: Cardiovascular complication will be avoided Outcome: Progressing   Problem: Skin Integrity: Goal: Risk for impaired skin integrity will decrease Outcome: Progressing   Problem: Education: Goal: Ability to demonstrate management of disease process will improve Outcome: Progressing Goal: Ability to verbalize understanding of medication therapies will improve Outcome: Progressing Goal: Individualized Educational Video(s) Outcome: Progressing   Problem: Activity: Goal: Capacity to carry out activities will improve Outcome: Progressing   Problem: Cardiac: Goal: Ability to achieve and maintain adequate cardiopulmonary perfusion will improve Outcome: Progressing   Problem: Education: Goal: Understanding of CV disease, CV risk reduction, and recovery process will improve Outcome: Progressing Goal: Individualized Educational Video(s) Outcome: Progressing   Problem: Activity: Goal: Ability to return to baseline activity level will improve Outcome: Progressing   Problem: Cardiovascular: Goal: Ability to achieve and maintain adequate cardiovascular perfusion will improve Outcome: Progressing Goal: Vascular access site(s) Level 0-1 will be maintained Outcome: Progressing   

## 2018-09-01 NOTE — Progress Notes (Signed)
Progress Note  Patient Name: Jose Myers Date of Encounter: 09/01/2018  Primary Cardiologist:   [name], MD   Subjective   The patient denies any SOB or pain.  He wants to go home.  However, he is very volume overloaded.  O2 sats in the 80s lying down.    Inpatient Medications    Scheduled Meds: . amiodarone  200 mg Oral BID  . aspirin EC  81 mg Oral Daily  . atorvastatin  40 mg Oral q1800  . carvedilol  3.125 mg Oral BID WC  . feeding supplement (GLUCERNA SHAKE)  237 mL Oral BID BM  . furosemide  80 mg Oral BID  . insulin aspart  0-15 Units Subcutaneous TID WC  . insulin aspart  0-5 Units Subcutaneous QHS  . insulin glargine  10 Units Subcutaneous Daily  . multivitamin with minerals  1 tablet Oral Daily  . omega-3 acid ethyl esters  1 g Oral QHS  . patient's guide to using coumadin book   Does not apply Once  . potassium chloride  40 mEq Oral Once  . sertraline  25 mg Oral Daily  . Warfarin - Pharmacist Dosing Inpatient   Does not apply q1800   Continuous Infusions: . sodium chloride 250 mL (09/01/18 1008)  .  ceFAZolin (ANCEF) IV 1 g (09/01/18 0528)   PRN Meds: sodium chloride, acetaminophen, acetaminophen-codeine, docusate sodium, fentaNYL, LORazepam, ondansetron (ZOFRAN) IV, ondansetron (ZOFRAN) IV, polyethylene glycol, promethazine, RESOURCE THICKENUP CLEAR, sodium chloride flush, traZODone   Vital Signs    Vitals:   08/31/18 1958 09/01/18 0335 09/01/18 0718 09/01/18 1133  BP: 112/79 103/80 104/75 101/78  Pulse: 80     Resp: 18 (!) 21 20 (!) 26  Temp: (!) 97.5 F (36.4 C) 98.2 F (36.8 C) 97.9 F (36.6 C) (!) 97.4 F (36.3 C)  TempSrc: Oral Oral Oral Oral  SpO2: 93% 97% 96% 94%  Weight:      Height:        Intake/Output Summary (Last 24 hours) at 09/01/2018 1140 Last data filed at 09/01/2018 0718 Gross per 24 hour  Intake 940 ml  Output 951 ml  Net -11 ml   Filed Weights   08/29/18 0300 08/30/18 0639 08/31/18 0500  Weight: 94.1  kg 94.3 kg 94.5 kg    Telemetry    NSR - Personally Reviewed  ECG    NA - Personally Reviewed  Physical Exam   GEN: No acute distress.   Neck:   Positive JVD Cardiac: RRR, no murmurs, rubs, or gallops.  Respiratory: Decreased breath sounds bilaterally GI: Soft, nontender, non-distended  MS:   Severe edema; No deformity. Neuro:  Nonfocal  Psych: Normal affect   Labs    Chemistry Recent Labs  Lab 08/27/18 0222 08/28/18 0219 08/29/18 0210 08/30/18 0325 08/31/18 1330  NA 137 137 135 136  --   K 4.1 3.5 3.5 3.4*  --   CL 97* 98 96* 98  --   CO2 30 29 28 30   --   GLUCOSE 110* 153* 166* 120*  --   BUN 47* 43* 42* 40*  --   CREATININE 2.47* 2.38* 2.49* 2.46* 2.39*  CALCIUM 8.8* 9.0 8.7* 8.4*  --   ALBUMIN 2.9* 2.9* 2.9*  --   --   GFRNONAA 26* 27* 26* 26* 27*  GFRAA 30* 31* 30* 30* 31*  ANIONGAP 10 10 11 8   --      Hematology Recent Labs  Lab 08/29/18 0210 08/30/18  0325 08/31/18 1330  WBC 9.8 12.2* 10.7*  RBC 4.47 4.31 4.40  HGB 12.6* 12.0* 12.2*  HCT 38.6* 37.1* 38.7*  MCV 86.4 86.1 88.0  MCH 28.2 27.8 27.7  MCHC 32.6 32.3 31.5  RDW 14.4 14.3 14.6  PLT 215 214 247    Cardiac EnzymesNo results for input(s): TROPONINI in the last 168 hours. No results for input(s): TROPIPOC in the last 168 hours.   BNPNo results for input(s): BNP, PROBNP in the last 168 hours.   DDimer No results for input(s): DDIMER in the last 168 hours.   Radiology    Dg Chest 2 View  Result Date: 09/01/2018 CLINICAL DATA:  AICD placement yesterday. History of congestive heart failure. EXAM: CHEST - 2 VIEW COMPARISON:  08/27/2018 FINDINGS: Single lead AICD projects over the cardiac shadow. Suspected mild enlargement of the cardiopericardial silhouette. Continued obscuration of the hemidiaphragms and partial obscuration of the cardiac borders due to bilateral pleural effusions with associated atelectasis. Overall the right pleural effusion appears slightly larger than previous. No  pneumothorax. Thoracic spondylosis is present. Low lung volumes. IMPRESSION: 1. Mild enlargement of the cardiopericardial silhouette with bilateral pleural effusions and associated atelectasis. 2. Interval placement of an AICD, without complicating feature. 3. Low lung volumes. Electronically Signed   By: Gaylyn Rong M.D.   On: 09/01/2018 08:22    Cardiac Studies   08/20/18 ECHO  Study Conclusions  - Left ventricle: Systolic function was severely reduced. The   estimated ejection fraction was in the range of 25% to 30%.   Dyskinesis and scarring of the apical myocardium. Akinesis of the   mid-apicalanteroseptal, anterior, and anterolateral myocardium;   consistent with infarction in the distribution of the left   anterior descending coronary artery; unchanged from the study of   08/18/2018. Doppler parameters are consistent with restrictive   physiology, indicative of decreased left ventricular diastolic   compliance and/or increased left atrial pressure. No evidence of   thrombus. - Mitral valve: Calcified annulus. There was severe regurgitation   directed centrally. The acceleration rate of the regurgitant jet   was reduced, consistent with a low dP/dt. Severe regurgitation is   suggested by pulmonary vein systolic flow reversal. - Left atrium: The atrium was severely dilated. - Tricuspid valve: There was mild-moderate regurgitation directed   centrally. - Pulmonary arteries: Systolic pressure was mildly increased. PA   peak pressure: 43 mm Hg (S).  Impressions:  - Compared to the previous study images, the large LV apical   thrombus is no longer present. Wall motion and EF are unchanged,   but the mitral insufficiency appears worse.  Patient Profile     62 y.o. male with a history of type 2 diabetes mellitus, recently documented severe multivessel CAD with poor revascularization options, cardiomyopathy with LVEF 25 to 30% and LV mural thrombus, VT arrest and PEA  successfully resuscitated, acute renal failure, hyperlipidemia, and transient delirium.  Assessment & Plan    CAD:  Medical management for now.  Might need high risk PCI eventually but not a candidate at this point.    VT ARREST:  Now status post ICD.   LV THROMBUS:   On warfarin with therapeutic INR.  However, now on hold and his vit K was reversed as he might need a thoracentesis.    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  Creat was stable yesterday and has been for several days.  I spoke with Dr. Allena Katz to let him know that we will  need to attempt more aggressive IV diuresis and they will continue to follow his renal function with [place].    ACUTE ON CHRONIC SYSTOLIC HF:    I will change to IV diuresis today.  I spoke with Dr. Gala Romney.  I think that this patient might well need advanced HF therapies and certainly would benefit from the continuity and team approach offered by the Advanced Heart Failure team.  I am grateful for his willingness to see the patient.    (Greater than 40 minutes reviewing all data with greater than 50% face to face with the patient).    For questions or updates, please contact [facility] Please consult [email or web address] for contact info under Cardiology/STEMI.   Signed, Rollene Rotunda, MD  09/01/2018, 11:40 AM

## 2018-09-01 NOTE — Progress Notes (Signed)
Occupational Therapy Treatment Patient Details Name: Jose Myers MRN: [id number] DOB: July 20, 1956 Today's Date: 09/01/2018    History of present illness 62 y.o. male admitted 08/15/18 with NSTEMI, presenting with systolic CHF and acute renal insufficiency.  EF 25-30% with LV thrombus, not a surgical candidate. On 8/23, he went into v-tach arrest, CPR, epi x2,  defibrillated x1. Code stroke called on 8/24 for speech difficulty and confusion, MRI negative, CT showed possible cerebellum infarct, which neurology believes may be artifact.    OT comments  Pt progressing towards established OT goals. Providing education on LB ADLs, bathing with shower seat, and energy conservation. Providing handout on EC and reviewed with pt and wife, both verbalized understanding and discussed techniques they could implement at home. SpO2 96%-89% on 3L. Continue to recommend dc home once medically stable per physician. Will continue to follow acutely as admitted.    Follow Up Recommendations  No OT follow up;Supervision/Assistance - 24 hour    Equipment Recommendations  None recommended by OT    Recommendations for Other Services PT consult    Precautions / Restrictions Precautions Precautions: Fall Restrictions Weight Bearing Restrictions: No       Mobility Bed Mobility               General bed mobility comments: pt received sitting EOB  Transfers Overall transfer level: Needs assistance Equipment used: None Transfers: Sit to/from Stand Sit to Stand: Supervision         General transfer comment: supervision for safety    Balance Overall balance assessment: Needs assistance Sitting-balance support: No upper extremity supported;Feet supported Sitting balance-Leahy Scale: Good       Standing balance-Leahy Scale: Good                             ADL either performed or assessed with clinical judgement   ADL Overall ADL's : Needs assistance/impaired                Lower Body Bathing Details (indicate cue type and reason): Educating pt on compensatory techniques for LB ADLs for energy conservation     Lower Body Dressing: Sit to/from stand;Supervision/safety Lower Body Dressing Details (indicate cue type and reason): Educating pt on compensatory techniques for energy conservation. Pt able to bring ankles to knees. supervision for safety           Tub/Shower Transfer Details (indicate cue type and reason): Discussed use of shower seat for EC. Pt's wife reporting that family memeber may have shower seat.  Functional mobility during ADLs: Supervision/safety General ADL Comments: SpO2 dropping to 89% on 3L during simple ADL task in room. Providing education on LB ADLs, shower seat for bathing, and energy conservation. Providing pt and wife with handout on [facility] and reviewed with pt and his wife. Pt verablized understanding.      Vision       Perception     Praxis      Cognition Arousal/Alertness: Awake/alert Behavior During Therapy: WFL for tasks assessed/performed Overall Cognitive Status: Within Functional Limits for tasks assessed                                          Exercises     Shoulder Instructions       General Comments Wife present throughout session. SpO2 89%-96% on 3L O2  Pertinent Vitals/ Pain       Pain Assessment: No/denies pain  Home Living                                          Prior Functioning/Environment              Frequency  Min 2X/week        Progress Toward Goals  OT Goals(current goals can now be found in the care plan section)  Progress towards OT goals: Progressing toward goals  Acute Rehab OT Goals Patient Stated Goal: walk OT Goal Formulation: With patient Time For Goal Achievement: 09/06/18 Potential to Achieve Goals: Good ADL Goals Pt Will Perform Grooming: with modified independence;standing Pt Will Perform Lower Body Dressing: with  modified independence;sit to/from stand Pt Will Transfer to Toilet: with modified independence;ambulating;regular height toilet Pt Will Perform Toileting - Clothing Manipulation and hygiene: with modified independence;sit to/from stand  Plan Discharge plan remains appropriate    Co-evaluation                 AM-PAC PT "6 Clicks" Daily Activity     Outcome Measure   Help from another person eating meals?: None Help from another person taking care of personal grooming?: None Help from another person toileting, which includes using toliet, bedpan, or urinal?: A Little Help from another person bathing (including washing, rinsing, drying)?: A Little Help from another person to put on and taking off regular upper body clothing?: None Help from another person to put on and taking off regular lower body clothing?: A Little 6 Click Score: 21    End of Session Equipment Utilized During Treatment: Oxygen(3L)  OT Visit Diagnosis: Unsteadiness on feet (R26.81);Other abnormalities of gait and mobility (R26.89);Muscle weakness (generalized) (M62.81);Other symptoms and signs involving cognitive function   Activity Tolerance Patient tolerated treatment well   Patient Left in bed;with call bell/phone within reach;with family/visitor present   Nurse Communication Mobility status        Time: 9741-6384 OT Time Calculation (min): 19 min  Charges: OT General Charges $OT Visit: 1 Visit OT Treatments $Self Care/Home Management : 8-22 mins  Kostantinos Tallman MSOT, OTR/L Acute Rehab Pager: [phone] Office: [phone]   Jose Myers 09/01/2018, 4:28 PM

## 2018-09-01 NOTE — Progress Notes (Addendum)
Progress Note  Patient Name: Jose Myers Date of Encounter: 09/01/2018  Primary Cardiologist: [name], MD   Subjective   Feels well, minimal discomfort at implant site, denies SOB.  Hopes to go home.  Inpatient Medications    Scheduled Meds: . amiodarone  400 mg Oral BID  . aspirin EC  81 mg Oral Daily  . atorvastatin  40 mg Oral q1800  . carvedilol  3.125 mg Oral BID WC  . feeding supplement (GLUCERNA SHAKE)  237 mL Oral BID BM  . furosemide  80 mg Oral BID  . insulin aspart  0-15 Units Subcutaneous TID WC  . insulin aspart  0-5 Units Subcutaneous QHS  . insulin glargine  10 Units Subcutaneous Daily  . multivitamin with minerals  1 tablet Oral Daily  . omega-3 acid ethyl esters  1 g Oral QHS  . patient's guide to using coumadin book   Does not apply Once  . potassium chloride  40 mEq Oral Once  . sertraline  25 mg Oral Daily  . Warfarin - Pharmacist Dosing Inpatient   Does not apply q1800   Continuous Infusions: . sodium chloride Stopped (08/26/18 1710)  .  ceFAZolin (ANCEF) IV 1 g (09/01/18 0528)   PRN Meds: sodium chloride, acetaminophen, acetaminophen-codeine, docusate sodium, fentaNYL, LORazepam, ondansetron (ZOFRAN) IV, ondansetron (ZOFRAN) IV, polyethylene glycol, promethazine, RESOURCE THICKENUP CLEAR, sodium chloride flush, traZODone   Vital Signs    Vitals:   08/31/18 1500 08/31/18 1958 09/01/18 0335 09/01/18 0718  BP: 110/87 112/79 103/80 104/75  Pulse:  80    Resp: (!) 22 18 (!) 21 20  Temp: (!) 96.2 F (35.7 C) (!) 97.5 F (36.4 C) 98.2 F (36.8 C) 97.9 F (36.6 C)  TempSrc: Oral Oral Oral Oral  SpO2:  93% 97% 96%  Weight:      Height:        Intake/Output Summary (Last 24 hours) at 09/01/2018 0745 Last data filed at 09/01/2018 0335 Gross per 24 hour  Intake 840 ml  Output 1201 ml  Net -361 ml   Filed Weights   08/29/18 0300 08/30/18 0639 08/31/18 0500  Weight: 94.1 kg 94.3 kg 94.5 kg    Telemetry    SR - Personally  Reviewed  ECG    No new EKGs - Personally Reviewed  Physical Exam   GEN: No acute distress.   Neck: No JVD Cardiac: RRR, no murmurs, rubs, or gallops.  Respiratory: diminished at the bases bilaterally, no rales, rhonchi, or wheezes, noted area of ecchymosis R flank GI: Soft, nontender, non-distended  MS: 2+ edema; No deformity. Neuro:  Nonfocal  Psych: Normal affect   ICD implant site is dry, no hematoma  Labs    Chemistry Recent Labs  Lab 08/27/18 0222 08/28/18 0219 08/29/18 0210 08/30/18 0325 08/31/18 1330  NA 137 137 135 136  --   K 4.1 3.5 3.5 3.4*  --   CL 97* 98 96* 98  --   CO2 30 29 28 30   --   GLUCOSE 110* 153* 166* 120*  --   BUN 47* 43* 42* 40*  --   CREATININE 2.47* 2.38* 2.49* 2.46* 2.39*  CALCIUM 8.8* 9.0 8.7* 8.4*  --   ALBUMIN 2.9* 2.9* 2.9*  --   --   GFRNONAA 26* 27* 26* 26* 27*  GFRAA 30* 31* 30* 30* 31*  ANIONGAP 10 10 11 8   --      Hematology Recent Labs  Lab 08/29/18 0210 08/30/18 4098  08/31/18 1330  WBC 9.8 12.2* 10.7*  RBC 4.47 4.31 4.40  HGB 12.6* 12.0* 12.2*  HCT 38.6* 37.1* 38.7*  MCV 86.4 86.1 88.0  MCH 28.2 27.8 27.7  MCHC 32.6 32.3 31.5  RDW 14.4 14.3 14.6  PLT 215 214 247    Cardiac EnzymesNo results for input(s): TROPONINI in the last 168 hours. No results for input(s): TROPIPOC in the last 168 hours.   BNPNo results for input(s): BNP, PROBNP in the last 168 hours.   DDimer No results for input(s): DDIMER in the last 168 hours.   Radiology    No results found.  Cardiac Studies   08/20/18: TTE Study Conclusions - Left ventricle: Systolic function was severely reduced. The estimated ejection fraction was in the range of 25% to 30%. Dyskinesis and scarring of the apical myocardium. Akinesis of the mid-apicalanteroseptal, anterior, and anterolateral myocardium; consistent with infarction in the distribution of the left anterior descending coronary artery; unchanged from the study of 08/18/2018.  Doppler parameters are consistent with restrictive physiology, indicative of decreased left ventricular diastolic compliance and/or increased left atrial pressure. No evidence of thrombus. - Mitral valve: Calcified annulus. There was severe regurgitation directed centrally. The acceleration rate of the regurgitant jet was reduced, consistent with a low dP/dt. Severe regurgitation is suggested by pulmonary vein systolic flow reversal. - Left atrium: The atrium was severely dilated. - Tricuspid valve: There was mild-moderate regurgitation directed centrally. - Pulmonary arteries: Systolic pressure was mildly increased. PA peak pressure: 43 mm Hg (S). Impressions: - Compared to the previous study images, the large LV apical thrombus is no longer present. Wall motion and EF are unchanged, but the mitral insufficiency appears worse.   08/19/18: LHC  Severe multivessel CAD including left main. Distal left main 85%.  Diffusely diseased and totally occluded LAD in the mid segment. Proximal 80 to 99% obstruction. Diagonals are totally occluded.  80% ostial circumflex, 90% proximal first obtuse marginal, and total occlusion of the circumflex beyond the first marginal. Diffuse disease in the first marginal.  Dominant right coronary with segmental 70% distal stenosis, 85% distal RCA beyond the PDA but before the first large left ventricular branch. Diffuse disease in the proximal to midportion of the large right ventricular branch and the second posterolateral branch.  Left ventriculography and hemodynamic recordings were not performed because of LV apical thrombus.  Recommendations:   T CTS evaluation for surgical revascularization.  Guideline directed therapy for systolic heart failure: SGLT2 therapy for diabetes, resume ARB but preferably Entresto for decreased LV function, beta-blocker therapy as tolerated, and diuresis as needed for symptom  control.  Monitor kidney function closely.  Anticoagulation for acute coronary syndrome and LV thrombus  Patient Profile     62 y.o. male with a hx of CAD, DM, HTN   was admitted 08/15/18 with c/o abdominal pain noted abn Trop and EKG, evidence of fluid OL, and AKI.  Ruled in for NSTEMI, planned for cath pending renal function improvement, and able to tolerate from CHF perspective.  TTE done, noted LVEF 25-30% and LV apical thrombus (already on heparin gtt for NSTEM).  He underwent cath 08/19/18 noting severe multivessel CAD, including LM disease, CTS evaluation was done and felt to have poor target vessels and complete revascularization not possible, found to be too high risk for CABG, with preference to consider PCI to CABG, noting that Impella would not be an option given LV thrombus.  08/20/18 suffered a VT arrest, requiring CPR,  2 rounds of epi,  2 defibrillations, intubation, code time was 16 minutes with 6 minutes of ROSC in between. He was given Mag 2 grams, and bolused with amiodarone and started on gtt. Ultimately CTS decided not a CABG candidate, and if meaningful recovery from arrest, to consider high-risk PCI. Post arrest TTE 08/20/18 noted apical thrombus was gone, other LVEF remained same extubated 08/21/18  08/22/18, code stroke called for acute AMS, neuro though most c/w delirium, though noted possible subacute cerebellar stroke by imaging(likelly artifact by their notes) and normal brain MRI done only an half hour prior to that. Nephrology consulted with worsening renal function, not felt will need dialysis, with improving urine OP furosemide challenge.  08/24/18, cardiology notes in discussion/review of films with interventional cardiologists, not felt to be a PCI candidate.  08/25/18 EP is asked to weigh in on ICD, timing pending clinical course  08/27/18, R thoracentesis 1/4L  Assessment & Plan    1. NSTEMI 2. 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 S/p cardiac  arrest     Now on PO amiodarone >> down-titrate today to 200mg  BID for 2 weeks then 200mg  daily       S/p ICD implant yesterday  4. AKI     Creat yesterday 2.39 (about where he has been for last several days)  5. ICM     Appears to still be fluid OL by exam/CXR,  I/O is fluid negative cumulatively -     Ongoing management is deferred to primary cardiology team  6. ICM w/LV thrombus >> gone post arrest     Not felt to have suffered CVA by neuro notes     Planned for lifelong a/c with risk of recurrance given WMA,mid-apicalanteroseptal, anterior, and anterolateral myocardium; consistent with infarction       INR today 3.82     Will give 1 mg IV Vit K this am given fresh implant, goal INR 2-2.5 in the next week   ICD implant site looks OK, no bleeding, no hematoma CXR this AM without ptx ICD check this morning with intact function Wound care and activity restrictions were reviewed with the patient NO DRIVING 6 MONTHS given cardiac arrest was discussed as well. Routine EP post implant follow up in place  7. Leukocytosis,better, 10.7 today      Afebrile     Denies symptoms of illness   We will sign off from EP standpoint Reduced today amiodarone to 200mg  BID for 2 weeks then 200mg  daily Goal INR in the next week post implant 2-2.5  We remain available, please recall if needed   For questions or updates, please contact [facility] Please consult [email or web address] for contact info under Cardiology/STEMI.      Signed, Sheilah Pigeon, PA-C  09/01/2018, 7:45 AM    EP Attending  Patient seen and examined. Agree with above. The patient is doing well from ICD perspective. His device has been interogated and is working normally. His CXR shows large bilateral pleural effusions. His sats are 100% when he is standing. He goes down to high 80's when he lies flat, likely due to right to left shunting across of PFO. INR is 3.8. Ok for DC home from [employer]. His situation is  made more difficult by his chronic renal insufficiency and pleural effusions and elevated INR. I think he will need to come back in once INR is under 2 to undergo repeat thoracentesis. Because he is fairly comfortable at least sitting up I think he will be ok provided he have  early followup and plan for thoracentesis. I would consider holding his warfarin. He could be scheduled for thoracentesis in a couple of days.  Leonia Reeves.D.

## 2018-09-02 ENCOUNTER — Inpatient Hospital Stay (HOSPITAL_COMMUNITY): Payer: BLUE CROSS/BLUE SHIELD

## 2018-09-02 DIAGNOSIS — I5023 Acute on chronic systolic (congestive) heart failure: Secondary | ICD-10-CM

## 2018-09-02 LAB — BASIC METABOLIC PANEL
Anion gap: 15 (ref 5–15)
BUN: 39 mg/dL — ABNORMAL HIGH (ref 8–23)
CO2: 23 mmol/L (ref 22–32)
Calcium: 8.8 mg/dL — ABNORMAL LOW (ref 8.9–10.3)
Chloride: 97 mmol/L — ABNORMAL LOW (ref 98–111)
Creatinine, Ser: 2.39 mg/dL — ABNORMAL HIGH (ref 0.61–1.24)
GFR calc non Af Amer: 27 mL/min — ABNORMAL LOW (ref >60)
GFR, EST AFRICAN AMERICAN: 31 mL/min — AB (ref >60)
Glucose, Bld: 153 mg/dL — ABNORMAL HIGH (ref 70–99)
POTASSIUM: 4.2 mmol/L (ref 3.5–5.1)
Sodium: 135 mmol/L (ref 135–145)

## 2018-09-02 LAB — PROTIME-INR
INR: 1.81
Prothrombin Time: 20.9 seconds — ABNORMAL HIGH (ref 11.4–15.2)

## 2018-09-02 LAB — GLUCOSE, CAPILLARY
GLUCOSE-CAPILLARY: 190 mg/dL — AB (ref 70–99)
GLUCOSE-CAPILLARY: 139 mg/dL — AB (ref 70–99)
Glucose-Capillary: 158 mg/dL — ABNORMAL HIGH (ref 70–99)
Glucose-Capillary: 229 mg/dL — ABNORMAL HIGH (ref 70–99)

## 2018-09-02 LAB — MAGNESIUM: Magnesium: 1.8 mg/dL (ref 1.7–2.4)

## 2018-09-02 MED ORDER — METOLAZONE 2.5 MG PO TABS
2.5000 mg | ORAL_TABLET | Freq: Once | ORAL | Status: AC
  Administered 2018-09-02: 2.5 mg via ORAL
  Filled 2018-09-02: qty 1

## 2018-09-02 MED ORDER — INFLUENZA VAC SPLIT QUAD 0.5 ML IM SUSY
0.5000 mL | PREFILLED_SYRINGE | INTRAMUSCULAR | Status: AC | PRN
  Administered 2018-09-06: 0.5 mL via INTRAMUSCULAR
  Filled 2018-09-02: qty 0.5

## 2018-09-02 MED ORDER — PROMETHAZINE HCL 25 MG/ML IJ SOLN
12.5000 mg | Freq: Four times a day (QID) | INTRAMUSCULAR | Status: DC | PRN
  Administered 2018-09-02: 12.5 mg via INTRAVENOUS
  Filled 2018-09-02: qty 1

## 2018-09-02 MED ORDER — SIMETHICONE 80 MG PO CHEW: 80.0000 mg | CHEWABLE_TABLET | Freq: Four times a day (QID) | ORAL | Status: DC | PRN

## 2018-09-02 MED ORDER — WARFARIN SODIUM 3 MG PO TABS
3.0000 mg | ORAL_TABLET | Freq: Once | ORAL | Status: AC
  Administered 2018-09-02: 3 mg via ORAL
  Filled 2018-09-02: qty 1

## 2018-09-02 MED ORDER — ORAL CARE MOUTH RINSE
15.0000 mL | Freq: Two times a day (BID) | OROMUCOSAL | Status: DC
  Administered 2018-09-02 – 2018-09-05 (×4): 15 mL via OROMUCOSAL

## 2018-09-02 NOTE — Consult Note (Addendum)
Advanced Heart Failure Team Consult Note   Primary Physician: Daisy Floro, MD PCP-Cardiologist:  [name], MD  Reason for Consultation: Acute systolic CHF/ Volume overload  HPI:    Jose Myers is seen today for evaluation of Acute systolic CHF/ Volume overload at the request of Dr. Antoine Poche.   Jose Myers is a 62 y.o. male with PMH of DM2, HTN, and HLD.   Pt was admitted 08/15/18 with abdominal pain. Noted to have elevated troponin and EKG changes, volume overload, and AKI. Ruled in for NSTEMI and taken for cath pending renal function improvement and diuresis.  Cath 08/19/18 showed severe multivessel CAD, including LM disease, CTS evaluation was done and felt to have poor target vessels and complete revascularization not possible. TCTS saw and found to be too high risk for CABG, with preference to consider PCI to CABG, noting that Impella would not be an option given LV thrombus.   On 08/20/18, pt suffered a VT arrest requiring CPR, 2 shocks, 2 rounds of Epi, and intubation. Post arrest Echo 08/20/18 noted that apical thrombus was gone.   Pt extubated 08/21/18  On 08/22/18, Code stroke was called due to acute AMS. Neuro though most c/w delirium, though noted possible subacute cerebellar stroke by imaging. Normal brain MRI. Nephrology consulted for worsening renal function, but felt to be improving enough with IV lasix not to need HD.    Interventional cardiology reviewed films at length 08/24/18 and felt not to be candidate for PCI. EP asked to see for ICD consideration pending clinical course. Pt stabilized and underwent ICD placement 08/31/18.  S/p R thoracentesis with 1.4L out 08/27/18. Stable ICD. Low lung volumes.   HF team asked to consult on 09/02/18 due to marked volume overload.   He is uncomfortable today. Feels like the fluid in his lungs is re-accumulating as per CXR yesterday. He felt "100%" initially after thoracentesis.  He tells me that he first  started feeling bad about 1 week prior to his hospitalization with SOB and abdominal pain that he initially thought was food poisoning due to associated nausea and intermittent vomiting. Prior to that, he was otherwise healthy prior to admission, apart from being on meds for diabetes and HTN. He denied any DOE. 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 No lightheadedness or dizziness. Has never had any chest pain. He denies and family history of significant cardiac disease. He is a non-smoker, and does not drink ETOH. Denies drug use, and says in fact he works/worked for the [employer]. He continues to feel swollen in his leg, but more so feels "full" and painful in his back relating to his pleural effusions.    Negative 1 L yesterday with 80 mg BID of IV lasix. He has overall -10.3 L from admit, but weights have been kept intermittently. Weight 205 on admit. Peaked at 21. He is 208 today.   CXR 09/01/18 with mild enlargement of the cardiopericadial silhouette with bilateral pleural effusions and associated atelectasis.  08/19/18: R/LHC - Severe multivessel CAD including left main.  -Distal left main 85%. - Diffusely diseased and totally occluded LAD in the mid segment. Proximal 80 to 99% obstruction. Diagonals are totally occluded. - 80% ostial circumflex, 90% proximal first obtuse marginal, and total occlusion of the circumflex beyond the first marginal. Diffuse disease in the first marginal. - Dominant right coronary with segmental 70% distal stenosis, 85% distal RCA beyond the PDA but before the first large  left ventricular branch. Diffuse disease in the proximal to midportion of the large right ventricular branch and the second posterolateral branch. - Left ventriculography and hemodynamic recordings were not performed because of LV apical thrombus. R[facility] Procedural Findings: Hemodynamics (mmHg) RA mean 3 RV 29/1 PA 30/14 (21) PCWP 14 AO 105/72 (86) Cardiac  Output (Fick) 4.57 Cardiac Index (Fick) 2.18 PAPI 5.3  PVR 2 WU  Echo 08/20/18 - LVEF 25-30%, Severe central MR, Severe LAE, Mild/Mod TR, PA peak pressure 43 mm Hg.   VAS US Carotid 08/23/18 Right Carotid: Velocities in the right ICA are consistent with a 1-39% stenosis. Left Carotid: Unable to visualize majority of carotid due to central line. Vertebrals: Right vertebral artery demonstrates antegrade flow.  Review of Systems: [y] = yes, [ ]  = no   General: Weight gain [ ] ; Weight loss [ ] ; Anorexia [ ] ; Fatigue [y]; Fever [ ] ; Chills [ ] ; Weakness [ ]   Cardiac: Chest pain/pressure [ ] ; Resting SOB [ ] ; Exertional SOB [ ] ; Orthopnea [ ] ; Pedal Edema [ ] ; Palpitations [ ] ; Syncope [ ] ; Presyncope [ ] ; Paroxysmal nocturnal dyspnea[ ]   Pulmonary: Cough [ ] ; Wheezing[ ] ; Hemoptysis[ ] ; Sputum [ ] ; Snoring [ ]   GI: Vomiting[ ] ; Dysphagia[ ] ; Melena[ ] ; Hematochezia [ ] ; Heartburn[ ] ; Abdominal pain [ ] ; Constipation [ ] ; Diarrhea [ ] ; BRBPR [ ]   GU: Hematuria[ ] ; Dysuria [ ] ; Nocturia[ ]   Vascular: Pain in legs with walking [ ] ; Pain in feet with lying flat [ ] ; Non-healing sores [ ] ; Stroke [ ] ; TIA [ ] ; Slurred speech [ ] ;  Neuro: Headaches[ ] ; Vertigo[ ] ; Seizures[ ] ; Paresthesias[ ] ;Blurred vision [ ] ; Diplopia [ ] ; Vision changes [ ]   Ortho/Skin: Arthritis [ ] ; Joint pain [ ] ; Muscle pain [ ] ; Joint swelling [ ] ; Back Pain [ ] ; Rash [ ]   Psych: Depression[ ] ; Anxiety[ ]   Heme: Bleeding problems [ ] ; Clotting disorders [ ] ; Anemia [ ]   Endocrine: Diabetes [ ] ; Thyroid dysfunction[ ]   Home Medication Prior to Admission medications   Medication Sig Start Date End Date Taking? Authorizing Provider  Apple Cid Vn-Grn Tea-Bit Or-Cr (APPLE CIDER VINEGAR PLUS) TABS Take 1 tablet by mouth daily.   Yes [provider]  carvedilol (COREG) 6.25 MG tablet Take 6.25 mg by mouth 2 (two) times daily with a meal.   Yes [provider]  clomiPHENE (CLOMID) 50 MG tablet Take 25 mg by  mouth daily.   Yes [provider]  glimepiride (AMARYL) 4 MG tablet Take 4 mg by mouth 2 (two) times daily.   Yes [provider]  LORazepam (ATIVAN) 1 MG tablet Take 0.5-1 mg by mouth daily as needed for anxiety.   Yes [provider]  losartan (COZAAR) 100 MG tablet Take 100 mg by mouth daily. 06/13/18  Yes [provider]  metFORMIN (GLUCOPHAGE) 500 MG tablet Take 500 mg by mouth 3 (three) times daily.    Yes [provider]  Omega 3 340 MG CPDR Take 1 capsule by mouth at bedtime.   Yes [provider]  Prenat-FePoly-Fered-FA-Omega 3 (DUET DHA 430 PO) Take 1 capsule by mouth daily.   Yes [provider]  sertraline (ZOLOFT) 50 MG tablet Take 25 mg by mouth daily. 07/13/18  Yes [provider]  traZODone (DESYREL) 50 MG tablet Take 50 mg by mouth at bedtime as needed. 08/13/18  Yes [provider]  verapamil (VERELAN PM) 240 MG 24 hr capsule  Take 240 mg by mouth 2 (two) times daily.    Yes [provider]   Past Medical History: Past Medical History:  Diagnosis Date  . Coronary artery disease   . Diabetes mellitus without complication (HCC)   . Hypertension    Past Surgical History: Past Surgical History:  Procedure Laterality Date  . CARDIAC CATHETERIZATION    . CORONARY ANGIOGRAPHY N/A 08/19/2018   Procedure: CORONARY ANGIOGRAPHY (CATH LAB);  Surgeon: Lyn Records, MD;  Location: [facility] INVASIVE CV LAB;  Service: Cardiovascular;  Laterality: N/A;  . EYE SURGERY    . ICD IMPLANT N/A 08/31/2018   Procedure: ICD IMPLANT;  Surgeon: Marinus Maw, MD;  Location: [facility] INVASIVE CV LAB;  Service: Cardiovascular;  Laterality: N/A;  . IR THORACENTESIS ASP PLEURAL SPACE W/IMG GUIDE  08/27/2018  . PERCUTANEOUS CORONARY STENT INTERVENTION (PCI-S)  04/11/2003   LAD  . RIGHT HEART CATH N/A 08/19/2018   Procedure: RIGHT HEART CATH;  Surgeon: Lyn Records, MD;  Location: [facility] INVASIVE CV LAB;  Service: Cardiovascular;   Laterality: N/A;   Family History: Family History  Problem Relation Age of Onset  . Diabetes Mellitus II Mother   . Throat cancer Brother    Social History: Social History   Socioeconomic History  . Marital status: Married    Spouse name: Not on file  . Number of children: Not on file  . Years of education: Not on file  . Highest education level: Not on file  Occupational History  . Not on file  Social Needs  . Financial resource strain: Not on file  . Food insecurity:    Worry: Not on file    Inability: Not on file  . Transportation needs:    Medical: Not on file    Non-medical: Not on file  Tobacco Use  . Smoking status: Never Smoker  . Smokeless tobacco: Never Used  Substance and Sexual Activity  . Alcohol use: Yes    Comment: Occasionally.  . Drug use: Not on file  . Sexual activity: Not on file  Lifestyle  . Physical activity:    Days per week: Not on file    Minutes per session: Not on file  . Stress: Not on file  Relationships  . Social connections:    Talks on phone: Not on file    Gets together: Not on file    Attends religious service: Not on file    Active member of club or organization: Not on file    Attends meetings of clubs or organizations: Not on file    Relationship status: Not on file  Other Topics Concern  . Not on file  Social History Narrative  . Not on file   Allergies:  No Known Allergies  Objective:    Vital Signs:   Temp:  [97.4 F (36.3 C)-98.1 F (36.7 C)] 98.1 F (36.7 C) (09/05 0754) Pulse Rate:  [76-84] 81 (09/05 0754) Resp:  [17-26] 20 (09/05 0754) BP: (101-127)/(74-96) 109/96 (09/05 0754) SpO2:  [90 %-99 %] 99 % (09/05 0754) Last BM Date: 08/31/18  Weight change: Filed Weights   08/29/18 0300 08/30/18 0639 08/31/18 0500  Weight: 94.1 kg 94.3 kg 94.5 kg   Intake/Output:   Intake/Output Summary (Last 24 hours) at 09/02/2018 0819 Last data filed at 09/02/2018 0500 Gross per 24 hour  Intake 240 ml  Output 1425 ml   Net -1185 ml    Physical Exam    General:  Fatigued appearing. No resp  difficulty HEENT: normal Neck: supple. JVP difficult, but appears at least 8 cm +. Carotids 2+ bilat; no bruits. No lymphadenopathy or thyromegaly appreciated. Cor: PMI nondisplaced. Regular rate & rhythm. No rubs, gallops or murmurs. Lungs: Diminished throughout with dull basilar sounds, R>L 1/3 to 1/2 way up.  Abdomen: Soft, nontender, nondistended. No hepatosplenomegaly. No bruits or masses. Good bowel sounds. Extremities: no cyanosis, clubbing, or rash. + Ted hose. 2+ edema into knees above ted hose, but does not seem to extend into thighs. Knees cool the touch, thighs more warm.  Neuro: alert & orientedx3, cranial nerves grossly intact. moves all 4 extremities w/o difficulty. Affect pleasant  Telemetry   NSR 80-90s Personally reviewed  EKG    08/22/18 sinus tach with PVCs and anterior q waves, 108 bpm, personally reviewed. (Most recent)  Labs   Basic Metabolic Panel: Recent Labs  Lab 08/27/18 0222 08/28/18 0219 08/29/18 0210 08/30/18 0325 08/31/18 1330  NA 137 137 135 136  --   K 4.1 3.5 3.5 3.4*  --   CL 97* 98 96* 98  --   CO2 30 29 28 30   --   GLUCOSE 110* 153* 166* 120*  --   BUN 47* 43* 42* 40*  --   CREATININE 2.47* 2.38* 2.49* 2.46* 2.39*  CALCIUM 8.8* 9.0 8.7* 8.4*  --   PHOS 3.9 4.0 4.3  --   --     Liver Function Tests: Recent Labs  Lab 08/27/18 0222 08/28/18 0219 08/29/18 0210  ALBUMIN 2.9* 2.9* 2.9*   No results for input(s): LIPASE, AMYLASE in the last 168 hours. No results for input(s): AMMONIA in the last 168 hours.  CBC: Recent Labs  Lab 08/27/18 0222 08/28/18 0219 08/29/18 0210 08/30/18 0325 08/31/18 1330  WBC 9.9 9.2 9.8 12.2* 10.7*  HGB 12.3* 12.5* 12.6* 12.0* 12.2*  HCT 37.8* 38.2* 38.6* 37.1* 38.7*  MCV 86.1 85.3 86.4 86.1 88.0  PLT 211 236 215 214 247    Cardiac Enzymes: No results for input(s): CKTOTAL, CKMB, CKMBINDEX, TROPONINI in the last 168  hours.  BNP: BNP (last 3 results) Recent Labs    08/15/18 1649 08/20/18 1258  BNP 930.7* 1,184.0*    ProBNP (last 3 results) No results for input(s): PROBNP in the last 8760 hours.   CBG: Recent Labs  Lab 08/31/18 2111 09/01/18 0809 09/01/18 1226 09/01/18 1719 09/01/18 2146  GLUCAP 273* 141* 268* 253* 142*    Coagulation Studies: Recent Labs    08/31/18 0232 09/01/18 0326 09/02/18 0312  LABPROT 28.1* 37.4* 20.9*  INR 2.66 3.82 1.81     Imaging    No results found.   Medications:     Current Medications: . amiodarone  200 mg Oral BID  . aspirin EC  81 mg Oral Daily  . atorvastatin  40 mg Oral q1800  . carvedilol  3.125 mg Oral BID WC  . feeding supplement (GLUCERNA SHAKE)  237 mL Oral BID BM  . furosemide  80 mg Intravenous BID  . insulin aspart  0-15 Units Subcutaneous TID WC  . insulin aspart  0-5 Units Subcutaneous QHS  . insulin glargine  10 Units Subcutaneous Daily  . multivitamin with minerals  1 tablet Oral Daily  . omega-3 acid ethyl esters  1 g Oral QHS  . patient's guide to using coumadin book   Does not apply Once  . potassium chloride  40 mEq Oral Once  . sertraline  25 mg Oral Daily  . Warfarin - Pharmacist Dosing  Inpatient   Does not apply q1800     Infusions: . sodium chloride 250 mL (09/01/18 1008)       Patient Profile   Jose Myers is a 62 y.o. 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   Admitted for NSTEMI and found to have severe multivessel CAD with no revascularization options. Course complicated by VT and PEA arrest, s/p ICD implant. HF team asked to see due to continued volume overload.   Assessment/Plan   1. Acute on chronic systolic CHF - ICM by cath as above.  - EF 25-30% by echo  - Volume status difficult, but appears to still have volume on board, especially in regards to his pleural effusions.  - Continue  lasix 80 mg IV BID for now.  - Continue coreg 3.125 mg BID. Low threshold to stop. He has some symptoms concerning for low output. CI low normal on cath earlier this admission.  - May benefit from PICC for CVP/Coox.  - ACE/ARB on hold with unclear CKD and labile AKI - Could consider dig but would need to follow closely with AKI.  - Hold off on spiro for now with AKI/CKD III-IV. May tolerate once optimized.   2. Severe multivessel CAD - Recent cath as above. Felt not to be candidate for CABG or PCI.  - Dr. Gala Romney previously reviewed wit interventional team and felt there was possibility of high-risk PCI of LM into LCX - Continue ASA. Also on coumadin for LV thrombus and ? CVA.   3. LV Thrombus - 1.81 this am. (Coumadin had been held in setting of thoracentesis). - First noted on cath, but noted to gone on Echo post cardiac arrest.   4. CKD III-IV - Creatinine 0.98 on [facility] lab report in 10/17 and 0.96 10/2014 (Renal states baseline 1.6-1.8). - Creatinine 2.59 on admission. Has been as low as 1.6 and as high as 3.6.  - Cr 2.39. Will need to follow for baseline.  - UA + protein. Will check renal u/s. Ordered for 09/03/18.  5. S/p VT arrest 08/20/18 - Now s/p ICD 08/31/18. No driving for 6 months.   6. Acute respiratory failure - In setting of cardiac arrest. Now resolved.  - Stable on room air at this time.   7. HLD - On atorvastatin 40 mg daily.  - 08/22/18 Total Chol 111, HDL 32, LDL 59, TGs 98  8. DM2 - SSI.  - Hgb A1C 8.2 this admit.  - If GFR improves should consider jardiance  9. ?CVA - Head CT 08/22/18 with small hypodensity within the right inferior cerebellum which may represent an acute or subacute infarction. Mild/Mod chronic microvascular ischemic changes. Small chronic infarct within the left putamen extending into corona radiata and caudate body.  - MRI brain 08/22/18 negative for acute stroke, indicating CT concern was artifact - Neuro signed off 08/23/18. Overall  thought to not have acute stroke, but history of previous by imaging.  - On coumadin for LV thrombus as above.   10. Pleural Effusions - R>L, and feels like re-accumulating with worsening symptoms per pt.  - Will re-assess with MD. Consider diurese today and follow with CXR in am vs repeat thoracentesis.  Continue to diurese. Will discuss plan with MD. Suspect may need repeat Thoracentesis.   Medication concerns reviewed with patient and pharmacy team. Barriers identified: None at this time.   Length of Stay: [address]  Mariam Dollar  [place], PA-C  09/02/2018, 8:19 AM  Advanced Heart Failure Team Pager [phone] (M-F; 7a - 4p)  Please contact [facility] for night-coverage after hours (4p -7a ) and weekends on [email or web address]  62 y/o male with HTN, DM2 and CKD 3 admitted on 8/19 with ab pain and found to have NSTEMI. Underwent cath which showed severe 3v CAD with high-grade LM (85%) totally occluded proximal LAD and diffusely diseased RCA. EF 25-30% with LV thrombus. Not candidate for CABG due to lack of targets. Initially considered for high-risk PCI LM into LCX but developed cardiac arrest, possible stroke and AKI.   Now recovering but very frustrated at lack of continuity and direction of his care. Has underwent ICD placement as well as R thoracentesis. Remains volume overloaded on exam with moderate to large bilateral pleural effusions. Creatinine 1.8-> 2.4. UA with protein.  Able to walk 430 feet with PT today.   I spent a bit of time reviewing his hospitalization with him and his wife as well as the challenges in front of [place] with significant CAD, CHD, CKD and pleural effusion.. I think the first issue is to get him better diuresed. Will continue IV lasix and give a dose of metolazone. Will repeat CXR and see if effusions improving at all. Will likely need bilateral thoracenteses and possible Pleurex tubes. Will check renal u/s. Titrate HF meds as tolerated. Doubt low output at this point.    Arvilla Meres, MD  10:01 PM

## 2018-09-02 NOTE — Progress Notes (Signed)
CARDIAC REHAB PHASE I   PRE:  Rate/Rhythm: 87 SR    BP: sitting 110/83    SaO2: 85 RA on finger, 92 RA on ear  MODE:  Ambulation: 430 ft   POST:  Rate/Rhythm: 92 SR     BP: sitting 121/81    SaO2: 92 RA, ear, up to 98 RA with rest   Pt willing to walk. He was sitting in bed on RA as the O2 is drying his nose out. SaO2 85 RA on finger but pt looked well so I checked his ear. Ear was 92 RA. Pt able to walk slowly, HR controlled, SaO2 stayed above 90 during walk on RA. No c/o. Gave pt water for humidity for when he wears O2. He might need it in bed. Discussed ed with pt and wife. Reviewed HF, MI, daily weights, low sodium, low fluid, walking as tolerated, CRPII and NTG. Pt voiced understanding and requests his referral be sent to [place]. [phone]  Harriet Masson CES, ACSM 09/02/2018 2:47 PM

## 2018-09-02 NOTE — Plan of Care (Signed)
  Problem: Health Behavior/Discharge Planning: Goal: Ability to manage health-related needs will improve Outcome: Progressing   Problem: Clinical Measurements: Goal: Ability to maintain clinical measurements within normal limits will improve Outcome: Progressing Goal: Diagnostic test results will improve Outcome: Progressing Goal: Cardiovascular complication will be avoided Outcome: Progressing   Problem: Skin Integrity: Goal: Risk for impaired skin integrity will decrease Outcome: Progressing   Problem: Education: Goal: Ability to demonstrate management of disease process will improve Outcome: Progressing Goal: Ability to verbalize understanding of medication therapies will improve Outcome: Progressing Goal: Individualized Educational Video(s) Outcome: Progressing   Problem: Activity: Goal: Capacity to carry out activities will improve Outcome: Progressing   Problem: Cardiac: Goal: Ability to achieve and maintain adequate cardiopulmonary perfusion will improve Outcome: Progressing   Problem: Education: Goal: Understanding of CV disease, CV risk reduction, and recovery process will improve Outcome: Progressing Goal: Individualized Educational Video(s) Outcome: Progressing   Problem: Activity: Goal: Ability to return to baseline activity level will improve Outcome: Progressing   Problem: Cardiovascular: Goal: Ability to achieve and maintain adequate cardiovascular perfusion will improve Outcome: Progressing Goal: Vascular access site(s) Level 0-1 will be maintained Outcome: Progressing

## 2018-09-02 NOTE — Progress Notes (Signed)
Inpatient Diabetes Program Recommendations  AACE/ADA: New Consensus Statement on Inpatient Glycemic Control (2019)  Target Ranges:  Prepandial:   less than 140 mg/dL      Peak postprandial:   less than 180 mg/dL (1-2 hours)      Critically ill patients:  140 - 180 mg/dL   Results for Jose Myers, Jose Myers (MRN [id number]) as of 09/02/2018 07:36  Ref. Range 09/01/2018 08:09 09/01/2018 12:26 09/01/2018 17:19 09/01/2018 21:46  Glucose-Capillary Latest Ref Range: 70 - 99 mg/dL 732 (H)  Novolog 2 units  Lantus 10 units 268 (H)  Novolog 8 units 253 (H)  Novolog 8 units 142 (H)  Results for Jose Myers, Jose Myers (MRN [id number]) as of 09/02/2018 07:36  Ref. Range 08/31/2018 07:28 08/31/2018 12:34 08/31/2018 16:30 08/31/2018 21:11  Glucose-Capillary Latest Ref Range: 70 - 99 mg/dL 237 (H)  Novolog 3 units  Lantus 10 units 190 (H)   276 (H)  Novolog 3 units 273 (H)  Novolog 3 units   Review of Glycemic Control  Diabetes history: DM2 Outpatient Diabetes medications: Amaryl 4 mg bid, Metformin 500 mg tid Current orders for Inpatient glycemic control: Lantus 10 units daily, Novolog 0-15 units TID with meals, Novolog 0-5 units QHS  Inpatient Diabetes Program Recommendations:  Insulin - Meal Coverage:Post prandial glucose consistently elevated.  Please consider ordering Novolog 3 units TID with meals for meal coverage if patient eats at least 50% of meals.  Thanks, Orlando Penner, RN, MSN, CDE Diabetes Coordinator Inpatient Diabetes Program [phone] (Team Pager from 8am to 5pm)

## 2018-09-02 NOTE — Progress Notes (Addendum)
ANTICOAGULATION CONSULT NOTE   Pharmacy Consult: Warfarin Indication: chest pain/ACS/LV apical thrombus  No Known Allergies  Patient Measurements: Height: 5\' 9"  (175.3 cm) Weight: 208 lb 5.4 oz (94.5 kg) IBW/kg (Calculated) : 70.7 Heparin Dosing Weight: 90 kg  Vital Signs: Temp: 98.1 F (36.7 C) (09/05 0754) Temp Source: Oral (09/05 0754) BP: 109/96 (09/05 0754) Pulse Rate: 81 (09/05 0754)  Labs: Recent Labs    08/31/18 0232 08/31/18 1330 09/01/18 0326 09/02/18 0312  HGB  --  12.2*  --   --   HCT  --  38.7*  --   --   PLT  --  247  --   --   LABPROT 28.1*  --  37.4* 20.9*  INR 2.66  --  3.82 1.81  CREATININE  --  2.39*  --   --     Estimated Creatinine Clearance: 35.4 mL/min (A) (by C-G formula based on SCr of 2.39 mg/dL (H)).  Assessment: 63 YOM presents with 9 days of abdominal pain and increasing distension. Pt is s/p cath on 8/22 which showed multivessel CAD but not surgical/PCI candidate. She is noted with LV thrombus (now absent on repeat echo and chronic infarct). Patient was not on anticoagulation PTA.  S/p thoracentesis yesterday.  Warfarin held 8/29 and 8/30 d/t procedures planned, restarted 8/31. S/p ICD placement on 9/3. INR yesterday increased to 3.82 after small dose warfarin 2.5mg  x1 post-procedure. INR 1.81 after holding +1 mg vitamin K. Hgb 12, plt 214 on last check. Remains on concurrent amiodarone.      Goal of Therapy:  Monitor platelets by anticoagulation protocol: Yes  INR 2-3   Plan:  Restart warfarin 3 mg tonight  INR daily   Girard Cooter, PharmD Clinical Pharmacist  Pager: [phone] Phone: [phone] 09/02/2018 10:42 AM

## 2018-09-03 ENCOUNTER — Encounter (HOSPITAL_COMMUNITY): Payer: Self-pay | Admitting: Radiology

## 2018-09-03 ENCOUNTER — Inpatient Hospital Stay (HOSPITAL_COMMUNITY): Payer: BLUE CROSS/BLUE SHIELD

## 2018-09-03 HISTORY — PX: IR THORACENTESIS ASP PLEURAL SPACE W/IMG GUIDE: IMG5380

## 2018-09-03 LAB — BASIC METABOLIC PANEL
Anion gap: 13 (ref 5–15)
BUN: 39 mg/dL — ABNORMAL HIGH (ref 8–23)
CALCIUM: 8.7 mg/dL — AB (ref 8.9–10.3)
CO2: 29 mmol/L (ref 22–32)
CREATININE: 2.49 mg/dL — AB (ref 0.61–1.24)
Chloride: 94 mmol/L — ABNORMAL LOW (ref 98–111)
GFR calc Af Amer: 30 mL/min — ABNORMAL LOW (ref >60)
GFR, EST NON AFRICAN AMERICAN: 26 mL/min — AB (ref >60)
GLUCOSE: 145 mg/dL — AB (ref 70–99)
Potassium: 3.4 mmol/L — ABNORMAL LOW (ref 3.5–5.1)
Sodium: 136 mmol/L (ref 135–145)

## 2018-09-03 LAB — GLUCOSE, CAPILLARY
GLUCOSE-CAPILLARY: 218 mg/dL — AB (ref 70–99)
Glucose-Capillary: 124 mg/dL — ABNORMAL HIGH (ref 70–99)
Glucose-Capillary: 203 mg/dL — ABNORMAL HIGH (ref 70–99)
Glucose-Capillary: 280 mg/dL — ABNORMAL HIGH (ref 70–99)

## 2018-09-03 LAB — CBC
HEMATOCRIT: 37.5 % — AB (ref 39.0–52.0)
Hemoglobin: 12.3 g/dL — ABNORMAL LOW (ref 13.0–17.0)
MCH: 28.2 pg (ref 26.0–34.0)
MCHC: 32.8 g/dL (ref 30.0–36.0)
MCV: 86 fL (ref 78.0–100.0)
Platelets: 212 10*3/uL (ref 150–400)
RBC: 4.36 MIL/uL (ref 4.22–5.81)
RDW: 14.2 % (ref 11.5–15.5)
WBC: 14.3 10*3/uL — AB (ref 4.0–10.5)

## 2018-09-03 LAB — PROTIME-INR
INR: 1.63
PROTHROMBIN TIME: 19.1 s — AB (ref 11.4–15.2)

## 2018-09-03 MED ORDER — WARFARIN SODIUM 3 MG PO TABS
3.0000 mg | ORAL_TABLET | Freq: Once | ORAL | Status: AC
  Administered 2018-09-03: 3 mg via ORAL
  Filled 2018-09-03: qty 1

## 2018-09-03 MED ORDER — MAGNESIUM SULFATE 2 GM/50ML IV SOLN
2.0000 g | Freq: Once | INTRAVENOUS | Status: AC
  Administered 2018-09-03: 2 g via INTRAVENOUS
  Filled 2018-09-03: qty 50

## 2018-09-03 MED ORDER — SORBITOL 70 % SOLN
30.0000 mL | Freq: Once | Status: AC
  Administered 2018-09-04: 30 mL via ORAL
  Filled 2018-09-03: qty 30

## 2018-09-03 MED ORDER — POTASSIUM CHLORIDE CRYS ER 20 MEQ PO TBCR
40.0000 meq | EXTENDED_RELEASE_TABLET | Freq: Once | ORAL | Status: AC
  Administered 2018-09-03: 40 meq via ORAL
  Filled 2018-09-03: qty 2

## 2018-09-03 MED ORDER — LIDOCAINE HCL (PF) 2 % IJ SOLN
INTRAMUSCULAR | Status: AC
  Filled 2018-09-03: qty 20

## 2018-09-03 NOTE — Progress Notes (Signed)
Physical Therapy Treatment Patient Details Name: TYKEE HATCHEL MRN: [id number] DOB: 1956-02-03 Today's Date: 09/03/2018    History of Present Illness 62 y.o. male admitted 08/15/18 with NSTEMI, presenting with systolic CHF and acute renal insufficiency.  EF 25-30% with LV thrombus, not a surgical candidate. On 8/23, he went into v-tach arrest, CPR, epi x2,  defibrillated x1. Code stroke called on 8/24 for speech difficulty and confusion, MRI negative, CT showed possible cerebellum infarct, which neurology believes may be artifact.     PT Comments    Session focused on gait training and further assessing balance deficits. Pt ambulated 650' on 4L satting well, (desats on 2L to 74s with activity). No assistive device used however patient required contact guard for safety as he demonstrated unsteadiness. Scored 15/24 on DGI indicated increased risk of falling. Pt unable to maintain postural control during rhomberg and tandem stances EO and EC. Believe he will benefit from cont therapy to increase activity tolerance and improve balance.     Follow Up Recommendations  Outpatient PT;Supervision for mobility/OOB     Equipment Recommendations  None recommended by PT    Recommendations for Other Services OT consult     Precautions / Restrictions Precautions Precautions: Fall Restrictions Weight Bearing Restrictions: No    Mobility  Bed Mobility Overal bed mobility: Needs Assistance Bed Mobility: Supine to Sit     Supine to sit: Supervision     General bed mobility comments: pt received sitting EOB  Transfers Overall transfer level: Needs assistance Equipment used: None Transfers: Sit to/from Stand Sit to Stand: Supervision            Ambulation/Gait Ambulation/Gait assistance: Min guard Gait Distance (Feet): 650 Feet Assistive device: None Gait Pattern/deviations: Step-through pattern;Decreased stride length Gait velocity: slight decrease   General Gait Details: pt  ambulating without AD, with unsteadiness and staggering noted at times. performed DGI, results below. Pt remained in 90's on 4L O2   Stairs             Wheelchair Mobility    Modified Rankin (Stroke Patients Only)       Balance Overall balance assessment: Needs assistance Sitting-balance support: No upper extremity supported;Feet supported Sitting balance-Leahy Scale: Good       Standing balance-Leahy Scale: Fair                 High Level Balance Comments: patient unable to maintain posture with tandem feet EO, or rhomberg EC. postural sway immedaitly with both testing positions.  Standardized Balance Assessment Standardized Balance Assessment : Dynamic Gait Index   Dynamic Gait Index Level Surface: Mild Impairment Change in Gait Speed: Mild Impairment Gait with Horizontal Head Turns: Mild Impairment Gait with Vertical Head Turns: Mild Impairment Gait and Pivot Turn: Moderate Impairment Step Over Obstacle: Mild Impairment Step Around Obstacles: Mild Impairment Steps: Mild Impairment Total Score: 15      Cognition Arousal/Alertness: Awake/alert Behavior During Therapy: WFL for tasks assessed/performed Overall Cognitive Status: Within Functional Limits for tasks assessed                                        Exercises      General Comments        Pertinent Vitals/Pain Pain Assessment: No/denies pain    Home Living  Prior Function            PT Goals (current goals can now be found in the care plan section) Acute Rehab PT Goals Patient Stated Goal: walk PT Goal Formulation: With patient Time For Goal Achievement: 09/05/18 Potential to Achieve Goals: Good Progress towards PT goals: Progressing toward goals    Frequency    Min 3X/week      PT Plan Current plan remains appropriate    Co-evaluation              AM-PAC PT "6 Clicks" Daily Activity  Outcome Measure  Difficulty  turning over in bed (including adjusting bedclothes, sheets and blankets)?: None Difficulty moving from lying on back to sitting on the side of the bed? : None Difficulty sitting down on and standing up from a chair with arms (e.g., wheelchair, bedside commode, etc,.)?: None Help needed moving to and from a bed to chair (including a wheelchair)?: A Little Help needed walking in hospital room?: A Little Help needed climbing 3-5 steps with a railing? : A Little 6 Click Score: 21    End of Session Equipment Utilized During Treatment: Gait belt;Oxygen Activity Tolerance: Patient tolerated treatment well;Patient limited by fatigue Patient left: in bed;with call bell/phone within reach;with family/visitor present Nurse Communication: Mobility status PT Visit Diagnosis: Unsteadiness on feet (R26.81)     Time: 1610-9604 PT Time Calculation (min) (ACUTE ONLY): 30 min  Charges:  $Gait Training: 23-37 mins                     Etta Grandchild, PT, DPT Acute Rehabilitation Services Pager: [phone] Office: [phone]     Etta Grandchild 09/03/2018, 9:26 AM

## 2018-09-03 NOTE — Progress Notes (Signed)
ANTICOAGULATION CONSULT NOTE   Pharmacy Consult: Warfarin Indication: chest pain/ACS/LV apical thrombus  No Known Allergies  Patient Measurements: Height: 5\' 9"  (175.3 cm) Weight: 206 lb 9.1 oz (93.7 kg) IBW/kg (Calculated) : 70.7 Heparin Dosing Weight: 90 kg  Vital Signs: Temp: 98 F (36.7 C) (09/06 0729) Temp Source: Oral (09/06 0729) BP: 105/70 (09/06 0729) Pulse Rate: 104 (09/06 0729)  Labs: Recent Labs    08/31/18 1330 09/01/18 0326 09/02/18 0312 09/02/18 0947 09/03/18 0410  HGB 12.2*  --   --   --   --   HCT 38.7*  --   --   --   --   PLT 247  --   --   --   --   LABPROT  --  37.4* 20.9*  --  19.1*  INR  --  3.82 1.81  --  1.63  CREATININE 2.39*  --   --  2.39* 2.49*    Estimated Creatinine Clearance: 33.9 mL/min (A) (by C-G formula based on SCr of 2.49 mg/dL (H)).  Assessment: 15 YOM presents with 9 days of abdominal pain and increasing distension. Pt is s/p cath on 8/22 which showed multivessel CAD but not surgical/PCI candidate. She is noted with LV thrombus (now absent on repeat echo and chronic infarct). Patient was not on anticoagulation PTA. S/p thoracentesis.  Warfarin held 8/29 and 8/30 d/t procedures planned, restarted 8/31. S/p ICD placement on 9/3. INR increased post-procedure to 3.82, dose was held and received 1 mg vitamin K. INR now downtrending to 1.81 and 1.63 today despite warfarin 3 mg dose last night. Hgb 12, plt 214 on last check. Remains on concurrent amiodarone.      Goal of Therapy:  Monitor platelets by anticoagulation protocol: Yes  INR 2-3   Plan:  Order warfarin 3 mg tonight  INR daily  F/u for plans for future thoracentesis  Girard Cooter, PharmD Clinical Pharmacist  Pager: [phone] Phone: [phone] 09/03/2018 9:40 AM

## 2018-09-03 NOTE — Procedures (Signed)
   US guided Rt thoracentesis  1.7 L amber fluid  Tolerated well CXR pending

## 2018-09-03 NOTE — Progress Notes (Addendum)
Advanced Heart Failure Rounding Note  PCP-Cardiologist: [name], MD   Subjective:    Weight down 9 lbs with IV lasix + metolazone. Cr 2.39 -> 2.49. K 3.4.  Feeling better today. Still having mid/upper R back pain, but breathing has eased off with diuresis. No CP. Mild orthopnea and peripheral edema persist.   Renal US 09/03/18 unremarkable.   Objective:   Weight Range: 93.7 kg Body mass index is 30.51 kg/m.   Vital Signs:   Temp:  [97.5 F (36.4 C)-98 F (36.7 C)] 98 F (36.7 C) (09/06 0729) Pulse Rate:  [74-104] 104 (09/06 0729) Resp:  [17-24] 24 (09/06 0729) BP: (96-116)/(70-88) 105/70 (09/06 0729) SpO2:  [78 %-97 %] 78 % (09/06 0729) Weight:  [93.7 kg-97.9 kg] 93.7 kg (09/06 0542) Last BM Date: 08/31/18  Weight change: Filed Weights   08/31/18 0500 09/02/18 1549 09/03/18 0542  Weight: 94.5 kg 97.9 kg 93.7 kg    Intake/Output:   Intake/Output Summary (Last 24 hours) at 09/03/2018 0940 Last data filed at 09/03/2018 0542 Gross per 24 hour  Intake 720 ml  Output 1575 ml  Net -855 ml     Physical Exam    General:  Fatigued appearing. No resp difficulty HEENT: Normal Neck: Supple. JVP difficult. Carotids 2+ bilat; no bruits. No lymphadenopathy or thyromegaly appreciated. Cor: PMI nondisplaced. Regular rate & rhythm. No rubs, gallops or murmurs. Lungs: Diminished throughout with dull basilar sounds, R>L. ~ 1/3 way up. Abdomen: Soft, nontender, nondistended. No hepatosplenomegaly. No bruits or masses. Good bowel sounds. Extremities: No cyanosis, clubbing, or rash. + Ted hose. 1-2+ edema into knees.  Neuro: Alert & orientedx3, cranial nerves grossly intact. moves all 4 extremities w/o difficulty. Affect pleasant  Telemetry   NSR 80-90s, personally reviewed.   EKG    No new tracings.    Labs    CBC Recent Labs    08/31/18 1330  WBC 10.7*  HGB 12.2*  HCT 38.7*  MCV 88.0  PLT 247   Basic Metabolic Panel Recent Labs    96/04/54 0947  09/03/18 0410  NA 135 136  K 4.2 3.4*  CL 97* 94*  CO2 23 29  GLUCOSE 153* 145*  BUN 39* 39*  CREATININE 2.39* 2.49*  CALCIUM 8.8* 8.7*  MG 1.8  --    Liver Function Tests No results for input(s): AST, ALT, ALKPHOS, BILITOT, PROT, ALBUMIN in the last 72 hours. No results for input(s): LIPASE, AMYLASE in the last 72 hours. Cardiac Enzymes No results for input(s): CKTOTAL, CKMB, CKMBINDEX, TROPONINI in the last 72 hours.  BNP: BNP (last 3 results) Recent Labs    08/15/18 1649 08/20/18 1258  BNP 930.7* 1,184.0*    ProBNP (last 3 results) No results for input(s): PROBNP in the last 8760 hours.   D-Dimer No results for input(s): DDIMER in the last 72 hours. Hemoglobin A1C No results for input(s): HGBA1C in the last 72 hours. Fasting Lipid Panel No results for input(s): CHOL, HDL, LDLCALC, TRIG, CHOLHDL, LDLDIRECT in the last 72 hours. Thyroid Function Tests No results for input(s): TSH, T4TOTAL, T3FREE, THYROIDAB in the last 72 hours.  Invalid input(s): FREET3  Other results:   Imaging    US Renal  Result Date: 09/03/2018 CLINICAL DATA:  Proteinuria EXAM: RENAL / URINARY TRACT ULTRASOUND COMPLETE COMPARISON:  CT 08/15/2018, ultrasound 06/21/2017 FINDINGS: Right Kidney: Length: 12.6 cm. Echogenicity within normal limits. No mass or hydronephrosis visualized. Left Kidney: Length: 12.9 cm. Echogenicity within normal limits. No mass or  hydronephrosis visualized. Bladder: Appears normal for degree of bladder distention. Incidentally noted are small pleural effusions. IMPRESSION: 1. Negative renal ultrasound 2. Incidentally noted are bilateral pleural effusion Electronically Signed   By: Jasmine Pang M.D.   On: 09/03/2018 00:05      Medications:     Scheduled Medications: . amiodarone  200 mg Oral BID  . aspirin EC  81 mg Oral Daily  . atorvastatin  40 mg Oral q1800  . carvedilol  3.125 mg Oral BID WC  . feeding supplement (GLUCERNA SHAKE)  237 mL Oral BID BM  .  furosemide  80 mg Intravenous BID  . insulin aspart  0-15 Units Subcutaneous TID WC  . insulin aspart  0-5 Units Subcutaneous QHS  . insulin glargine  10 Units Subcutaneous Daily  . mouth rinse  15 mL Mouth Rinse BID  . multivitamin with minerals  1 tablet Oral Daily  . omega-3 acid ethyl esters  1 g Oral QHS  . patient's guide to using coumadin book   Does not apply Once  . potassium chloride  40 mEq Oral Once  . sertraline  25 mg Oral Daily  . Warfarin - Pharmacist Dosing Inpatient   Does not apply q1800     Infusions: . sodium chloride 250 mL (09/01/18 1008)     PRN Medications:  sodium chloride, acetaminophen, acetaminophen-codeine, docusate sodium, fentaNYL, Influenza vac split quadrivalent PF, LORazepam, ondansetron (ZOFRAN) IV, polyethylene glycol, promethazine, promethazine, RESOURCE THICKENUP CLEAR, simethicone, sodium chloride flush, traZODone  Patient Profile   Jose Myers is a 61 y.o. 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   Admitted for NSTEMI and found to have severe multivessel CAD with no revascularization options. Course complicated by VT and PEA arrest, s/p ICD implant. HF team asked to see due to continued volume overload.   Assessment/Plan   1. Acute on chronic systolic CHF - ICM by cath as above.  - EF 25-30% by echo  - Volume status improved with IV lasix and metolazone.  - Continue IV lasix 80 mg BID. Excellent response to metolazone.  Consider repeat following CXR.  - Continue coreg 3.125 mg BID. Low threshold to stop. CI low normal on cath earlier this admission.  - ACE/ARB on hold with unclear CKD and labile AKI - Could consider dig but would need to follow closely with AKI.  - Hold off on spiro for now with AKI/CKD III-IV. May tolerate once optimized.   2. Severe multivessel CAD - Recent cath as above. Felt not to be candidate for CABG or PCI.  -  Dr. Gala Romney previously reviewed wit interventional team and felt there was possibility of high-risk PCI of LM into LCX - Continue ASA. Also on coumadin for LV thrombus and ? CVA.   3. LV Thrombus - INR 1.81 -> 1.63 this am. (Coumadin had been held in setting of thoracentesis). - First noted on cath, but noted to gone on Echo post cardiac arrest.   4. CKD III-IV - Creatinine 0.98 on [facility] lab report in 10/17 and 0.96 10/2014 (Renal states baseline 1.6-1.8). - Creatinine 2.59 on admission. Has been as low as 1.6 and as high as 3.6.  - Cr 2.39 -> 2.49. Will need to follow for baseline.  - UA + protein. Renal US negative.   5. S/p VT arrest 08/20/18 - Now s/p ICD 08/31/18. No driving for 6 months.    6. Acute  respiratory failure - In setting of cardiac arrest. Now resolved.  - Stable on room air.   7. HLD - On atorvastatin 40 mg daily.  - 08/22/18 Total Chol 111, HDL 32, LDL 59, TGs 98 - No change to current plan.    8. DM2 - SSI. - Hgb A1C 8.2 this admit.  - If GFR improves should consider jardiance  9. ?CVA - Head CT 08/22/18 with small hypodensity within the right inferior cerebellum which may represent an acute or subacute infarction. Mild/Mod chronic microvascular ischemic changes. Small chronic infarct within the left putamen extending into corona radiata and caudate body.  - MRI brain 08/22/18 negative for acute stroke, indicating CT concern was artifact - Neuro signed off 08/23/18. Overall thought to not have acute stroke, but history of previous by imaging.  - On coumadin for LV thrombus as above.  - No change to current plan.    10. Pleural Effusions - R>L, and feels like re-accumulating with worsening symptoms per pt.  - Repeat CXR. Consider repeat thoracentesis.   Medication concerns reviewed with patient and pharmacy team. Barriers identified: None at this time.   Length of Stay: [address]  Graciella Freer, [place]  09/03/2018, 9:40 AM  Advanced Heart Failure  Team Pager [phone] (M-F; 7a - 4p)  Please contact [facility] for night-coverage after hours (4p -7a ) and weekends on [email or web address]    Patient seen and examined with the above-signed Advanced Practice Provider and/or Housestaff. I personally reviewed laboratory data, imaging studies and relevant notes. I independently examined the patient and formulated the important aspects of the plan. I have edited the note to reflect any of my changes or salient points. I have personally discussed the plan with the patient and/or family.  Diuresed well overnight with IV lasix and metolazone. Breathing much better. Orthopnea resolving. Creatinine up slightly. Renal u/s normal. Able to walk with PT. Denies CP. Went for R thoracentesis this afternoon with 1.7 L off. Still with moderate-sized effusion on left. Will continue IV diuresis. May need L thoracentesis over the weekend. Hopefully home early next week.   Arvilla Meres, MD  5:38 PM

## 2018-09-03 NOTE — Care Management Note (Signed)
Case Management Note Previous Note created by Jiles Crocker   Patient Details  Name: Jose Myers MRN: [id number] Date of Birth: 1956/09/13  Subjective/Objective:    NSTEMI               Action/Plan: Patient lives at home with spouse; PCP: Daisy Floro, MD; has private insurance with BCBS with prescription drug coverage; Multivessel Disease, LV Thrombus. CM will continue to follow for progression of care.  Expected Discharge Date:  Undetermined at this time, pt being transferred to the ICU              Expected Discharge Plan:   TBD  Status of Service:   In progress  09/03/2018  Pt walked with cardiac rehab yesterday - pt maintained sats on RA.  Pt will need to be evaluated prior to discharge and ambulation test will need to be documented in system in order to determine if pt will qualify for insurance coverage of home oxygen.  CM also requested attending to order outpt PT.   09/01/18 Pt will need oxygen at discharge - amb note in [facility] given pending referral - CM requested order.  Per bedside nurse pt is ambulatory in room. Samuel Bouche [phone] 09/03/2018, 8:33 AM

## 2018-09-04 ENCOUNTER — Inpatient Hospital Stay (HOSPITAL_COMMUNITY): Payer: BLUE CROSS/BLUE SHIELD

## 2018-09-04 DIAGNOSIS — I5043 Acute on chronic combined systolic (congestive) and diastolic (congestive) heart failure: Secondary | ICD-10-CM

## 2018-09-04 LAB — LACTATE DEHYDROGENASE, PLEURAL OR PERITONEAL FLUID: LD FL: 70 U/L — AB (ref 3–23)

## 2018-09-04 LAB — BASIC METABOLIC PANEL
Anion gap: 11 (ref 5–15)
BUN: 45 mg/dL — AB (ref 8–23)
CHLORIDE: 95 mmol/L — AB (ref 98–111)
CO2: 27 mmol/L (ref 22–32)
CREATININE: 2.6 mg/dL — AB (ref 0.61–1.24)
Calcium: 8.4 mg/dL — ABNORMAL LOW (ref 8.9–10.3)
GFR calc Af Amer: 28 mL/min — ABNORMAL LOW (ref >60)
GFR calc non Af Amer: 24 mL/min — ABNORMAL LOW (ref >60)
GLUCOSE: 191 mg/dL — AB (ref 70–99)
POTASSIUM: 3.2 mmol/L — AB (ref 3.5–5.1)
Sodium: 133 mmol/L — ABNORMAL LOW (ref 135–145)

## 2018-09-04 LAB — PROTEIN, PLEURAL OR PERITONEAL FLUID: Total protein, fluid: <3 g/dL

## 2018-09-04 LAB — GLUCOSE, CAPILLARY
GLUCOSE-CAPILLARY: 157 mg/dL — AB (ref 70–99)
GLUCOSE-CAPILLARY: 169 mg/dL — AB (ref 70–99)
Glucose-Capillary: 149 mg/dL — ABNORMAL HIGH (ref 70–99)
Glucose-Capillary: 208 mg/dL — ABNORMAL HIGH (ref 70–99)

## 2018-09-04 LAB — MAGNESIUM: Magnesium: 2.1 mg/dL (ref 1.7–2.4)

## 2018-09-04 LAB — PROTIME-INR
INR: 2
Prothrombin Time: 22.5 seconds — ABNORMAL HIGH (ref 11.4–15.2)

## 2018-09-04 LAB — CBC
HEMATOCRIT: 35.3 % — AB (ref 39.0–52.0)
Hemoglobin: 11.3 g/dL — ABNORMAL LOW (ref 13.0–17.0)
MCH: 27.5 pg (ref 26.0–34.0)
MCHC: 32 g/dL (ref 30.0–36.0)
MCV: 85.9 fL (ref 78.0–100.0)
PLATELETS: 202 10*3/uL (ref 150–400)
RBC: 4.11 MIL/uL — ABNORMAL LOW (ref 4.22–5.81)
RDW: 14.3 % (ref 11.5–15.5)
WBC: 9.9 10*3/uL (ref 4.0–10.5)

## 2018-09-04 LAB — LACTATE DEHYDROGENASE: LDH: 213 U/L — AB (ref 98–192)

## 2018-09-04 MED ORDER — POTASSIUM CHLORIDE CRYS ER 20 MEQ PO TBCR
40.0000 meq | EXTENDED_RELEASE_TABLET | Freq: Once | ORAL | Status: AC
  Administered 2018-09-04: 40 meq via ORAL
  Filled 2018-09-04: qty 2

## 2018-09-04 MED ORDER — LIDOCAINE HCL (PF) 1 % IJ SOLN
INTRAMUSCULAR | Status: AC
  Filled 2018-09-04: qty 30

## 2018-09-04 MED ORDER — TORSEMIDE 20 MG PO TABS
60.0000 mg | ORAL_TABLET | Freq: Every day | ORAL | Status: DC
  Administered 2018-09-04 – 2018-09-06 (×3): 60 mg via ORAL
  Filled 2018-09-04 (×3): qty 3

## 2018-09-04 MED ORDER — WARFARIN SODIUM 3 MG PO TABS
3.0000 mg | ORAL_TABLET | Freq: Once | ORAL | Status: AC
  Administered 2018-09-04: 3 mg via ORAL
  Filled 2018-09-04: qty 1

## 2018-09-04 NOTE — Procedures (Signed)
PROCEDURE SUMMARY:  Successful US guided left thoracentesis. Yielded 800 mL of clear yellow fluid. Patient tolerated procedure well. No immediate complications.  Specimen was sent for labs.  Post procedure chest X-ray reveals no pneumothorax  Ashby Moskal S [place] Rogacki PA-C 09/04/2018 2:11 PM

## 2018-09-04 NOTE — Progress Notes (Signed)
ANTICOAGULATION CONSULT NOTE   Pharmacy Consult: Warfarin Indication: chest pain/ACS/LV apical thrombus  No Known Allergies  Patient Measurements: Height: 5\' 9"  (175.3 cm) Weight: 206 lb 9.1 oz (93.7 kg) IBW/kg (Calculated) : 70.7 Heparin Dosing Weight: 90 kg  Vital Signs: Temp: 98.3 F (36.8 C) (09/07 1124) Temp Source: Oral (09/07 1124) BP: 119/76 (09/07 1124) Pulse Rate: 77 (09/07 0705)  Labs: Recent Labs    09/02/18 0312 09/02/18 0947 09/03/18 0410 09/03/18 1146 09/04/18 0159  HGB  --   --   --  12.3* 11.3*  HCT  --   --   --  37.5* 35.3*  PLT  --   --   --  212 202  LABPROT 20.9*  --  19.1*  --  22.5*  INR 1.81  --  1.63  --  2.00  CREATININE  --  2.39* 2.49*  --  2.60*    Estimated Creatinine Clearance: 32.4 mL/min (A) (by C-G formula based on SCr of 2.6 mg/dL (H)).  Assessment: 50 yoM presents with 9 days of abdominal pain and increasing distension. Pt is s/p cath on 8/22 which showed multivessel CAD but not surgical/PCI candidate. He is noted with LV thrombus (now absent on repeat echo and chronic infarct). Patient was not on anticoagulation PTA. S/p thoracentesis.  Warfarin held 8/29 and 8/30 d/t procedures planned, restarted 8/31. S/p ICD placement on 9/3. INR increased post-procedure to 3.82, dose was held and received 1 mg vitamin K. INR now 2 s/p warfarin 3 mg x2. Hgb 11.3, plt 202 on last check. Remains on concurrent amiodarone.      Goal of Therapy:  Monitor platelets by anticoagulation protocol: Yes  INR 2-3   Plan:  Order warfarin 3 mg tonight  INR daily    Marcelino Freestone, PharmD PGY2 Cardiology Pharmacy Resident Phone [phone] 09/04/2018 3:11 PM

## 2018-09-04 NOTE — Progress Notes (Signed)
Patient ID: Jose Myers, male   DOB: 03-14-1956, 62 y.o.   MRN: [id number]     Advanced Heart Failure Rounding Note  PCP-Cardiologist: [name], MD   Subjective:    Weight down again with diuresis. Cr 2.39 -> 2.49 -> 2.6. K 3.2.  Breathing better, has had cough since thoracentesis.   He had right thoracentesis 9/6, 1.7 L off.   Renal US 09/03/18 unremarkable.   Objective:   Weight Range: 93.7 kg Body mass index is 30.51 kg/m.   Vital Signs:   Temp:  [97.3 F (36.3 C)-98.2 F (36.8 C)] 98 F (36.7 C) (09/07 0705) Pulse Rate:  [69-89] 77 (09/07 0705) Resp:  [14-26] 19 (09/07 0705) BP: (92-113)/(61-87) 111/77 (09/07 0705) SpO2:  [98 %-100 %] 100 % (09/07 0705) Last BM Date: 08/31/18  Weight change: Filed Weights   08/31/18 0500 09/02/18 1549 09/03/18 0542  Weight: 94.5 kg 97.9 kg 93.7 kg    Intake/Output:   Intake/Output Summary (Last 24 hours) at 09/04/2018 1120 Last data filed at 09/04/2018 0402 Gross per 24 hour  Intake 50 ml  Output 1625 ml  Net -1575 ml     Physical Exam    General: NAD Neck: JVP 8 cm, no thyromegaly or thyroid nodule.  Lungs: Crackles right base, decreased BS 1/2 up left lung field. CV: Nondisplaced PMI.  Heart regular S1/S2, no S3/S4, no murmur.  No peripheral edema.   Abdomen: Soft, nontender, no hepatosplenomegaly, no distention.  Skin: Intact without lesions or rashes.  Neurologic: Alert and oriented x 3.  Psych: Normal affect. Extremities: No clubbing or cyanosis.  HEENT: Normal.    Telemetry   NSR 80-90s, personally reviewed.   EKG    No new tracings.    Labs    CBC Recent Labs    09/03/18 1146 09/04/18 0159  WBC 14.3* 9.9  HGB 12.3* 11.3*  HCT 37.5* 35.3*  MCV 86.0 85.9  PLT 212 202   Basic Metabolic Panel Recent Labs    91/47/82 0947 09/03/18 0410 09/04/18 0159  NA 135 136 133*  K 4.2 3.4* 3.2*  CL 97* 94* 95*  CO2 23 29 27   GLUCOSE 153* 145* 191*  BUN 39* 39* 45*  CREATININE 2.39*  2.49* 2.60*  CALCIUM 8.8* 8.7* 8.4*  MG 1.8  --  2.1   Liver Function Tests No results for input(s): AST, ALT, ALKPHOS, BILITOT, PROT, ALBUMIN in the last 72 hours. No results for input(s): LIPASE, AMYLASE in the last 72 hours. Cardiac Enzymes No results for input(s): CKTOTAL, CKMB, CKMBINDEX, TROPONINI in the last 72 hours.  BNP: BNP (last 3 results) Recent Labs    08/15/18 1649 08/20/18 1258  BNP 930.7* 1,184.0*    ProBNP (last 3 results) No results for input(s): PROBNP in the last 8760 hours.   D-Dimer No results for input(s): DDIMER in the last 72 hours. Hemoglobin A1C No results for input(s): HGBA1C in the last 72 hours. Fasting Lipid Panel No results for input(s): CHOL, HDL, LDLCALC, TRIG, CHOLHDL, LDLDIRECT in the last 72 hours. Thyroid Function Tests No results for input(s): TSH, T4TOTAL, T3FREE, THYROIDAB in the last 72 hours.  Invalid input(s): FREET3  Other results:   Imaging    Dg Chest 1 View  Result Date: 09/03/2018 CLINICAL DATA:  Status post right thoracentesis. EXAM: CHEST  1 VIEW COMPARISON:  Earlier today. FINDINGS: The cardiac silhouette remains borderline enlarged. Increased left basilar pleural fluid and linear density with less patchy density. Small right pleural effusion,  significantly decreased. Decreased associated adjacent right basilar lung opacity. No pneumothorax. Stable left subclavian AICD lead. Thoracic spine degenerative changes. IMPRESSION: 1. Decreased right basilar atelectasis and pleural fluid following thoracentesis. 2. No pneumothorax. 3. Moderate-sized left pleural effusion, increased. 4. Overall decrease in left basilar atelectasis. Electronically Signed   By: Beckie Salts M.D.   On: 09/03/2018 16:10   Ir Thoracentesis Asp Pleural Space W/img Guide  Result Date: 09/03/2018 INDICATION: Symptomatic right sided pleural effusion EXAM: IR THORACENTESIS ASP PLEURAL SPACE W/IMG GUIDE COMPARISON:  Previous thoracentesis. MEDICATIONS: 10 cc  2% lidocaine. COMPLICATIONS: None immediate. TECHNIQUE: Informed written consent was obtained from the patient after a discussion of the risks, benefits and alternatives to treatment. A timeout was performed prior to the initiation of the procedure. Initial ultrasound scanning demonstrates a right pleural effusion. The lower chest was prepped and draped in the usual sterile fashion. 1% lidocaine was used for local anesthesia. Under direct ultrasound guidance, a 19 gauge, 7-cm, Yueh catheter was introduced. An ultrasound image was saved for documentation purposes. The thoracentesis was performed. The catheter was removed and a dressing was applied. The patient tolerated the procedure well without immediate post procedural complication. The patient was escorted to have an upright chest radiograph. FINDINGS: A total of approximately 1.7 liters of light amber fluid was removed. IMPRESSION: Successful ultrasound-guided right sided thoracentesis yielding 1.7 liters of pleural fluid. Read by Robet Leu [facility] Electronically Signed   By: Jolaine Click M.D.   On: 09/03/2018 16:00     Medications:     Scheduled Medications: . amiodarone  200 mg Oral BID  . aspirin EC  81 mg Oral Daily  . atorvastatin  40 mg Oral q1800  . carvedilol  3.125 mg Oral BID WC  . feeding supplement (GLUCERNA SHAKE)  237 mL Oral BID BM  . insulin aspart  0-15 Units Subcutaneous TID WC  . insulin aspart  0-5 Units Subcutaneous QHS  . insulin glargine  10 Units Subcutaneous Daily  . mouth rinse  15 mL Mouth Rinse BID  . multivitamin with minerals  1 tablet Oral Daily  . omega-3 acid ethyl esters  1 g Oral QHS  . patient's guide to using coumadin book   Does not apply Once  . potassium chloride  40 mEq Oral Once  . potassium chloride  40 mEq Oral Once  . sertraline  25 mg Oral Daily  . sorbitol  30 mL Oral Once  . torsemide  60 mg Oral Daily  . Warfarin - Pharmacist Dosing Inpatient   Does not apply q1800    Infusions: .  sodium chloride 250 mL (09/01/18 1008)    PRN Medications: sodium chloride, acetaminophen, acetaminophen-codeine, docusate sodium, fentaNYL, Influenza vac split quadrivalent PF, LORazepam, ondansetron (ZOFRAN) IV, polyethylene glycol, promethazine, promethazine, RESOURCE THICKENUP CLEAR, simethicone, sodium chloride flush, traZODone  Patient Profile   Jose Myers is a 62 y.o. 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   Admitted for NSTEMI and found to have severe multivessel CAD with no revascularization options. Course complicated by VT and PEA arrest, s/p ICD implant. HF team asked to see due to continued volume overload.   Assessment/Plan   1. Acute on chronic systolic CHF: Ischemic cardiomyopathy, EF 25-30% by echo.  Volume status improved with IV lasix and metolazone, weight down again.  JVP around 8 cm on exam. Creatinine up to 2.6.  - Stop IV Lasix/metolazone,  will start torsemide 60 mg daily.  - Continue coreg 3.125 mg BID.  - ACE/ARB/spironolactone/digoxin not started with elevated creatinine.  2. Severe multivessel CAD: Not thought to be CABG candidate.  Dr. Gala Romney previously reviewed with interventional team and felt there was possibility of high-risk PCI of LM into LCX, will need to review with him Monday.  - Continue ASA/statin. - Also on coumadin for LV thrombus and ?CVA.  3. LV Thrombus: INR 2 today. First noted on cath, but noted to gone on Echo post cardiac arrest.  - Ordered IR thoracentesis for left.  If IR will do today with INR 2, will continue warfarin.  If they want lower, can hold warfarin tonight and start heparin gtt.  4. CKD III-IV: Creatinine 0.98 on [facility] lab report in 10/17 and 0.96 10/2014 (Renal states baseline 1.6-1.8).  Creatinine 2.59 on admission. Has been as low as 1.6 and as high as 3.6. Cr 2.39 -> 2.49 -> 2.6.  UA + protein. Renal US negative.  -  Transition to po diuretic as above.  5. S/p VT arrest 08/20/18: Now s/p ICD 08/31/18. No driving for 6 months.   6. Acute respiratory failure: In setting of cardiac arrest. Now resolved.  - Stable on room air.  7. DM2: SSI. Hgb A1C 8.2 this admit.  - If GFR improves should consider jardiance 8. ?CVA: Head CT 08/22/18 with small hypodensity within the right inferior cerebellum which may represent an acute or subacute infarction. Mild/Mod chronic microvascular ischemic changes. Small chronic infarct within the left putamen extending into corona radiata and caudate body. MRI brain 08/22/18 negative for acute stroke, indicating CT concern was artifact. Neuro signed off 08/23/18. Overall thought to not have acute stroke, but history of previous by imaging.  - On coumadin for LV thrombus as above.  - No change to current plan.   9. Bilateral Pleural Effusions: Had right thoracentesis 9/6.  Decreased breath sounds, moderate effusion on left.  - Repeat CXR PA/lateral.  - Will order IR thoracentesis for left, will need to discuss INR (ok at 2 if today?, if not can hold warfarin and use heparin gtt).   Length of Stay: 20  Marca Ancona, MD  09/04/2018, 11:20 AM  Advanced Heart Failure Team Pager [phone] (M-F; 7a - 4p)  Please contact [facility] for night-coverage after hours (4p -7a ) and weekends on [email or web address]

## 2018-09-05 LAB — BASIC METABOLIC PANEL
Anion gap: 12 (ref 5–15)
BUN: 42 mg/dL — ABNORMAL HIGH (ref 8–23)
CHLORIDE: 93 mmol/L — AB (ref 98–111)
CO2: 29 mmol/L (ref 22–32)
CREATININE: 2.57 mg/dL — AB (ref 0.61–1.24)
Calcium: 8.4 mg/dL — ABNORMAL LOW (ref 8.9–10.3)
GFR calc Af Amer: 29 mL/min — ABNORMAL LOW (ref >60)
GFR, EST NON AFRICAN AMERICAN: 25 mL/min — AB (ref >60)
Glucose, Bld: 157 mg/dL — ABNORMAL HIGH (ref 70–99)
POTASSIUM: 3.3 mmol/L — AB (ref 3.5–5.1)
Sodium: 134 mmol/L — ABNORMAL LOW (ref 135–145)

## 2018-09-05 LAB — CBC
HCT: 36.8 % — ABNORMAL LOW (ref 39.0–52.0)
Hemoglobin: 12 g/dL — ABNORMAL LOW (ref 13.0–17.0)
MCH: 27.9 pg (ref 26.0–34.0)
MCHC: 32.6 g/dL (ref 30.0–36.0)
MCV: 85.6 fL (ref 78.0–100.0)
PLATELETS: 218 10*3/uL (ref 150–400)
RBC: 4.3 MIL/uL (ref 4.22–5.81)
RDW: 14.5 % (ref 11.5–15.5)
WBC: 11.2 10*3/uL — ABNORMAL HIGH (ref 4.0–10.5)

## 2018-09-05 LAB — GLUCOSE, CAPILLARY
GLUCOSE-CAPILLARY: 229 mg/dL — AB (ref 70–99)
GLUCOSE-CAPILLARY: 188 mg/dL — AB (ref 70–99)
Glucose-Capillary: 184 mg/dL — ABNORMAL HIGH (ref 70–99)
Glucose-Capillary: 219 mg/dL — ABNORMAL HIGH (ref 70–99)

## 2018-09-05 LAB — PROTIME-INR
INR: 2.57
Prothrombin Time: 27.4 seconds — ABNORMAL HIGH (ref 11.4–15.2)

## 2018-09-05 MED ORDER — WARFARIN SODIUM 1 MG PO TABS
1.5000 mg | ORAL_TABLET | Freq: Once | ORAL | Status: AC
  Administered 2018-09-05: 1.5 mg via ORAL
  Filled 2018-09-05: qty 1

## 2018-09-05 NOTE — Progress Notes (Signed)
Patient ID: Jose Myers, male   DOB: 22-Jul-1956, 62 y.o.   MRN: [id number]     Advanced Heart Failure Rounding Note  PCP-Cardiologist: [name], MD   Subjective:    Weight down again with diuresis. Cr 2.39 -> 2.49 -> 2.6 -> 2.57. I/Os negative on po torsemide with weight down.   Breathing overall better.  Still with cough and feels weak. Afebrile.  Has not been out of bed much.   He had right thoracentesis 9/6, 1.7 L off. He had left thoracentesis 9/7, 800 cc off (transudate by Light's criteria).    Renal US 09/03/18 unremarkable.   Objective:   Weight Range: 91.1 kg Body mass index is 29.67 kg/m.   Vital Signs:   Temp:  [97.8 F (36.6 C)-98.5 F (36.9 C)] 97.9 F (36.6 C) (09/08 0349) Pulse Rate:  [71] 71 (09/08 0349) Resp:  [19-28] 19 (09/08 0349) BP: (88-119)/(59-76) 95/59 (09/08 0349) SpO2:  [95 %-100 %] 97 % (09/08 0349) Weight:  [91.1 kg] 91.1 kg (09/08 0604) Last BM Date: 08/31/18  Weight change: Filed Weights   09/02/18 1549 09/03/18 0542 09/05/18 0604  Weight: 97.9 kg 93.7 kg 91.1 kg    Intake/Output:   Intake/Output Summary (Last 24 hours) at 09/05/2018 0957 Last data filed at 09/05/2018 0610 Gross per 24 hour  Intake 480 ml  Output 1275 ml  Net -795 ml     Physical Exam    General: NAD Neck: No JVD, no thyromegaly or thyroid nodule.  Lungs: Rhonchi bilaterally.  CV: Nondisplaced PMI.  Heart regular S1/S2, no S3/S4, no murmur.  No peripheral edema.   Abdomen: Soft, nontender, no hepatosplenomegaly, no distention.  Skin: Intact without lesions or rashes.  Neurologic: Alert and oriented x 3.  Psych: Normal affect. Extremities: No clubbing or cyanosis.  HEENT: Normal.    Telemetry   NSR 80s, personally reviewed.   EKG    No new tracings.    Labs    CBC Recent Labs    09/04/18 0159 09/05/18 0217  WBC 9.9 11.2*  HGB 11.3* 12.0*  HCT 35.3* 36.8*  MCV 85.9 85.6  PLT 202 218   Basic Metabolic Panel Recent Labs   09/04/18 0159 09/05/18 0217  NA 133* 134*  K 3.2* 3.3*  CL 95* 93*  CO2 27 29  GLUCOSE 191* 157*  BUN 45* 42*  CREATININE 2.60* 2.57*  CALCIUM 8.4* 8.4*  MG 2.1  --    Liver Function Tests No results for input(s): AST, ALT, ALKPHOS, BILITOT, PROT, ALBUMIN in the last 72 hours. No results for input(s): LIPASE, AMYLASE in the last 72 hours. Cardiac Enzymes No results for input(s): CKTOTAL, CKMB, CKMBINDEX, TROPONINI in the last 72 hours.  BNP: BNP (last 3 results) Recent Labs    08/15/18 1649 08/20/18 1258  BNP 930.7* 1,184.0*    ProBNP (last 3 results) No results for input(s): PROBNP in the last 8760 hours.   D-Dimer No results for input(s): DDIMER in the last 72 hours. Hemoglobin A1C No results for input(s): HGBA1C in the last 72 hours. Fasting Lipid Panel No results for input(s): CHOL, HDL, LDLCALC, TRIG, CHOLHDL, LDLDIRECT in the last 72 hours. Thyroid Function Tests No results for input(s): TSH, T4TOTAL, T3FREE, THYROIDAB in the last 72 hours.  Invalid input(s): FREET3  Other results:   Imaging    Dg Chest 1 View  Result Date: 09/04/2018 CLINICAL DATA:  Status post left-sided thoracentesis. EXAM: CHEST  1 VIEW COMPARISON:  September 03, 2018 and  September 04, 2018 FINDINGS: The left effusion persists but is smaller in the interval. A right basilar infiltrate with probable associated effusion remains. No pneumothorax. No other changes. Stable AICD device. IMPRESSION: 1. Persistent infiltrate and probable associated effusion in the right base. Recommend follow-up to resolution. 2. Improved but persistent effusion on the left after thoracentesis. No pneumothorax. Electronically Signed   By: Gerome Sam III M.D   On: 09/04/2018 14:24   Dg Chest 2 View  Result Date: 09/04/2018 CLINICAL DATA:  Right thoracentesis. EXAM: CHEST - 2 VIEW COMPARISON:  September 03, 2018 FINDINGS: Right basilar opacity remains with a probable associated effusion. No pneumothorax. A left  effusion and underlying opacity is stable. No other changes. IMPRESSION: 1. The right effusion appears smaller in the interval. There is infiltrate in the right base which could represent atelectasis or pneumonia. Recommend clinical correlation and follow-up to resolution. 2. No pneumothorax after right thoracentesis. 3. Small left effusion with underlying opacity is stable. Electronically Signed   By: Gerome Sam III M.D   On: 09/04/2018 14:25   US Thoracentesis Asp Pleural Space W/img Guide  Result Date: 09/04/2018 INDICATION: Patient with history of coronary artery disease, CHF, pleural effusion. Request for therapeutic left thoracentesis. EXAM: ULTRASOUND GUIDED LEFT THORACENTESIS MEDICATIONS: 1% lidocaine 10 mL COMPLICATIONS: None immediate. PROCEDURE: An ultrasound guided thoracentesis was thoroughly discussed with the patient and questions answered. The benefits, risks, alternatives and complications were also discussed. The patient understands and wishes to proceed with the procedure. Written consent was obtained. Ultrasound was performed to localize and mark an adequate pocket of fluid in the left chest. The area was then prepped and draped in the normal sterile fashion. 1% Lidocaine was used for local anesthesia. Under ultrasound guidance a 6 Fr Safe-T-Centesis catheter was introduced. Thoracentesis was performed. The catheter was removed and a dressing applied. FINDINGS: A total of approximately 800 mL of clear yellow fluid was removed. IMPRESSION: Successful ultrasound guided left thoracentesis yielding 800 mL of pleural fluid. No pneumothorax on post-procedure chest x-ray. Read by: Corrin Parker, PA-C Electronically Signed   By: Gilmer Mor D.O.   On: 09/04/2018 14:08     Medications:     Scheduled Medications: . amiodarone  200 mg Oral BID  . aspirin EC  81 mg Oral Daily  . atorvastatin  40 mg Oral q1800  . carvedilol  3.125 mg Oral BID WC  . feeding supplement (GLUCERNA SHAKE)  237  mL Oral BID BM  . insulin aspart  0-15 Units Subcutaneous TID WC  . insulin aspart  0-5 Units Subcutaneous QHS  . insulin glargine  10 Units Subcutaneous Daily  . mouth rinse  15 mL Mouth Rinse BID  . multivitamin with minerals  1 tablet Oral Daily  . omega-3 acid ethyl esters  1 g Oral QHS  . patient's guide to using coumadin book   Does not apply Once  . potassium chloride  40 mEq Oral Once  . sertraline  25 mg Oral Daily  . torsemide  60 mg Oral Daily  . warfarin  1.5 mg Oral ONCE-1800  . Warfarin - Pharmacist Dosing Inpatient   Does not apply q1800    Infusions: . sodium chloride 250 mL (09/01/18 1008)    PRN Medications: sodium chloride, acetaminophen, acetaminophen-codeine, docusate sodium, fentaNYL, Influenza vac split quadrivalent PF, LORazepam, ondansetron (ZOFRAN) IV, polyethylene glycol, promethazine, promethazine, RESOURCE THICKENUP CLEAR, simethicone, sodium chloride flush, traZODone  Patient Profile   Jose Myers is a 62 y.o.  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   Admitted for NSTEMI and found to have severe multivessel CAD with no revascularization options. Course complicated by VT and PEA arrest, s/p ICD implant. HF team asked to see due to continued volume overload.   Assessment/Plan   1. Acute on chronic systolic CHF: Ischemic cardiomyopathy, EF 25-30% by echo.  Creatinine stable at 2.57 this morning, negative I/Os yesterday after starting torsemide.   - Continue torsemide 60 mg daily.  - Continue coreg 3.125 mg BID.  - ACE/ARB/spironolactone/digoxin not started with elevated creatinine.  2. Severe multivessel CAD: Not thought to be CABG candidate.  Dr. Gala Romney previously reviewed with interventional team and felt there was possibility of high-risk PCI of LM into LCX, would defer for now with creatinine elevation.  May be able to revisit down the road.  No chest  pain.  - Continue ASA/statin. - Also on coumadin for LV thrombus and ?CVA.  3. LV Thrombus: INR 2 today. First noted on cath, but noted to gone on Echo post cardiac arrest. INR therapeutic now.  4. CKD III-IV: Creatinine 0.98 on [facility] lab report in 10/17 and 0.96 10/2014 (Renal states baseline 1.6-1.8).  Creatinine 2.59 on admission. Has been as low as 1.6 and as high as 3.6. Cr 2.39 -> 2.49 -> 2.6 -> 2.57.  UA + protein. Renal US negative.  - Stable for now on po torsemide, follow trend.  5. S/p VT arrest 08/20/18: Now s/p ICD 08/31/18. No driving for 6 months.   6. Acute respiratory failure: In setting of cardiac arrest. Now resolved.  - Stable on room air.  7. DM2: SSI. Hgb A1C 8.2 this admit.  - If GFR improves should consider jardiance 8. ?CVA: Head CT 08/22/18 with small hypodensity within the right inferior cerebellum which may represent an acute or subacute infarction. Mild/Mod chronic microvascular ischemic changes. Small chronic infarct within the left putamen extending into corona radiata and caudate body. MRI brain 08/22/18 negative for acute stroke, indicating CT concern was artifact. Neuro signed off 08/23/18. Overall thought to not have acute stroke, but history of previous by imaging.  - On coumadin for LV thrombus as above.  - No change to current plan.   9. Bilateral Pleural Effusions: Had right thoracentesis 9/6, left thoracentesis 9/7.  Transudative by Light's criteria.   10. Deconditioning: Need him to get out of bed, walk unit today.  Will plan to discharge tomorrow morning if he remains stable.  Will ask cardiac rehab to see.   Length of Stay: 29  Marca Ancona, MD  09/05/2018, 9:57 AM  Advanced Heart Failure Team Pager [phone] (M-F; 7a - 4p)  Please contact [facility] for night-coverage after hours (4p -7a ) and weekends on [email or web address]

## 2018-09-05 NOTE — Progress Notes (Signed)
ANTICOAGULATION CONSULT NOTE   Pharmacy Consult: Warfarin Indication: chest pain/ACS/LV apical thrombus  No Known Allergies  Patient Measurements: Height: 5\' 9"  (175.3 cm) Weight: 200 lb 14.4 oz (91.1 kg) IBW/kg (Calculated) : 70.7 Heparin Dosing Weight: 90 kg  Vital Signs: Temp: 97.9 F (36.6 C) (09/08 0349) Temp Source: Oral (09/08 0349) BP: 95/59 (09/08 0349) Pulse Rate: 71 (09/08 0349)  Labs: Recent Labs    09/03/18 0410  09/03/18 1146 09/04/18 0159 09/05/18 0217  HGB  --    < > 12.3* 11.3* 12.0*  HCT  --   --  37.5* 35.3* 36.8*  PLT  --   --  212 202 218  LABPROT 19.1*  --   --  22.5* 27.4*  INR 1.63  --   --  2.00 2.57  CREATININE 2.49*  --   --  2.60* 2.57*   < > = values in this interval not displayed.    Estimated Creatinine Clearance: 32.4 mL/min (A) (by C-G formula based on SCr of 2.57 mg/dL (H)).  Assessment: 45 yoM presents with 9 days of abdominal pain and increasing distension. Pt is s/p cath on 8/22 which showed multivessel CAD but not surgical/PCI candidate. He is noted with LV thrombus (now absent on repeat echo and chronic infarct). Patient was not on anticoagulation PTA. S/p thoracentesis.  Warfarin held 8/29 and 8/30 d/t procedures planned, restarted 8/31. S/p ICD placement on 9/3. INR increased post-procedure to 3.82, dose was held and received 1 mg vitamin K. INR increased and is now therapeutic at 2.57 s/p warfarin 3 mg x3. Hgb 12, plt 218 on last check. Remains on concurrent amiodarone.      Goal of Therapy:  Monitor platelets by anticoagulation protocol: Yes  INR 2-3   Plan:  Warfarin 1.5 mg tonight  INR daily    Marcelino Freestone, PharmD PGY2 Cardiology Pharmacy Resident Phone [phone] 09/05/2018 9:40 AM

## 2018-09-06 ENCOUNTER — Telehealth: Payer: Self-pay

## 2018-09-06 LAB — BASIC METABOLIC PANEL
Anion gap: 11 (ref 5–15)
BUN: 43 mg/dL — ABNORMAL HIGH (ref 8–23)
CALCIUM: 8.2 mg/dL — AB (ref 8.9–10.3)
CO2: 31 mmol/L (ref 22–32)
CREATININE: 2.59 mg/dL — AB (ref 0.61–1.24)
Chloride: 91 mmol/L — ABNORMAL LOW (ref 98–111)
GFR calc Af Amer: 28 mL/min — ABNORMAL LOW (ref >60)
GFR calc non Af Amer: 25 mL/min — ABNORMAL LOW (ref >60)
GLUCOSE: 202 mg/dL — AB (ref 70–99)
Potassium: 2.8 mmol/L — ABNORMAL LOW (ref 3.5–5.1)
Sodium: 133 mmol/L — ABNORMAL LOW (ref 135–145)

## 2018-09-06 LAB — GLUCOSE, CAPILLARY
GLUCOSE-CAPILLARY: 349 mg/dL — AB (ref 70–99)
Glucose-Capillary: 207 mg/dL — ABNORMAL HIGH (ref 70–99)

## 2018-09-06 LAB — CBC
HCT: 35 % — ABNORMAL LOW (ref 39.0–52.0)
HEMOGLOBIN: 11.5 g/dL — AB (ref 13.0–17.0)
MCH: 27.8 pg (ref 26.0–34.0)
MCHC: 32.9 g/dL (ref 30.0–36.0)
MCV: 84.7 fL (ref 78.0–100.0)
PLATELETS: 183 10*3/uL (ref 150–400)
RBC: 4.13 MIL/uL — ABNORMAL LOW (ref 4.22–5.81)
RDW: 14.3 % (ref 11.5–15.5)
WBC: 8.2 10*3/uL (ref 4.0–10.5)

## 2018-09-06 LAB — PROTIME-INR
INR: 2.48
PROTHROMBIN TIME: 26.6 s — AB (ref 11.4–15.2)

## 2018-09-06 LAB — MAGNESIUM: MAGNESIUM: 1.9 mg/dL (ref 1.7–2.4)

## 2018-09-06 MED ORDER — WARFARIN SODIUM 2 MG PO TABS: 2.0000 mg | ORAL_TABLET | Freq: Every day | ORAL | 11 refills | Status: AC

## 2018-09-06 MED ORDER — DOCUSATE SODIUM 100 MG PO CAPS: 100.0000 mg | ORAL_CAPSULE | Freq: Two times a day (BID) | ORAL | 0 refills | Status: AC | PRN

## 2018-09-06 MED ORDER — POTASSIUM CHLORIDE CRYS ER 20 MEQ PO TBCR
40.0000 meq | EXTENDED_RELEASE_TABLET | Freq: Every day | ORAL | Status: DC
  Administered 2018-09-06: 40 meq via ORAL
  Filled 2018-09-06: qty 2

## 2018-09-06 MED ORDER — ATORVASTATIN CALCIUM 40 MG PO TABS: 40.0000 mg | ORAL_TABLET | Freq: Every day | ORAL | 6 refills | Status: AC

## 2018-09-06 MED ORDER — WARFARIN SODIUM 2 MG PO TABS: 2.0000 mg | ORAL_TABLET | Freq: Once | ORAL | Status: DC

## 2018-09-06 MED ORDER — POLYETHYLENE GLYCOL 3350 17 G PO PACK: 17.0000 g | PACK | Freq: Every day | ORAL | 0 refills | Status: AC | PRN

## 2018-09-06 MED ORDER — POTASSIUM CHLORIDE CRYS ER 20 MEQ PO TBCR: 40.0000 meq | EXTENDED_RELEASE_TABLET | Freq: Every day | ORAL | 6 refills | Status: DC

## 2018-09-06 MED ORDER — CARVEDILOL 6.25 MG PO TABS: 3.1250 mg | ORAL_TABLET | Freq: Two times a day (BID) | ORAL | 6 refills | Status: AC

## 2018-09-06 MED ORDER — AMIODARONE HCL 200 MG PO TABS: 200.0000 mg | ORAL_TABLET | Freq: Every day | ORAL | 6 refills | Status: DC

## 2018-09-06 MED ORDER — TORSEMIDE 20 MG PO TABS: 60.0000 mg | ORAL_TABLET | Freq: Every day | ORAL | 6 refills | Status: AC

## 2018-09-06 MED ORDER — ADULT MULTIVITAMIN W/MINERALS CH: 1.0000 | ORAL_TABLET | Freq: Every day | ORAL | 6 refills | Status: AC

## 2018-09-06 MED ORDER — GLUCERNA SHAKE PO LIQD: 237.0000 mL | Freq: Two times a day (BID) | ORAL | 0 refills | Status: AC

## 2018-09-06 MED ORDER — ASPIRIN 81 MG PO TBEC: 81.0000 mg | DELAYED_RELEASE_TABLET | Freq: Every day | ORAL | 6 refills | Status: AC

## 2018-09-06 MED ORDER — POTASSIUM CHLORIDE CRYS ER 20 MEQ PO TBCR
40.0000 meq | EXTENDED_RELEASE_TABLET | Freq: Once | ORAL | Status: AC
  Administered 2018-09-06: 40 meq via ORAL
  Filled 2018-09-06: qty 2

## 2018-09-06 NOTE — Discharge Summary (Addendum)
Advanced Heart Failure Discharge Note  Discharge Summary   Patient ID: NYGEL PROKOP MRN: [id number], DOB/AGE: 1956/07/21 62 y.o. Admit date: 08/15/2018 D/C date:     09/06/2018   Primary Discharge Diagnoses:  1. Acute on chronic systolic CHF 2. Severe multivessel CAD 3. LV Thrombus 4. CKD III-IV 5. S/p VT arrest 08/20/18 6. Acute respiratory failure 7. DM2 8. ?CVA 9. Bilateral Pleural Effusions 10. Deconditioning 11. Hypokalemia  Hospital Course:   Theodoros Stjames Escontriasis a 62 y.o.malewith PMH of DM2, severe multivessel CAD with poor revascularization options, Chronic systolic CHF, LV mural thrombus, and HLD.   Pt was admitted 08/15/18 with abdominal pain. Noted to have elevated troponin and EKG changes, volume overload, and AKI. Ruled in for NSTEMI and taken for cath pending renal function improvement and diuresis.  Cath 08/19/18 showed severe multivessel CAD, including LM disease, CTS evaluation was done and felt to have poor target vessels and complete revascularization not possible. TCTS saw and found to be too high risk for CABG, with preference to consider PCI to CABG, noting that Impella would not be an option given LV thrombus.   On 08/20/18, pt suffered a VT arrest requiring CPR, 2 shocks, 2 rounds of Epi, and intubation. Post arrest Echo 08/20/18 noted that apical thrombus was gone and EF of 25-30%.   Pt extubated 08/21/18  On 08/22/18, Code stroke was called due to acute AMS. Neuro thought this was most c/w delirium, though noted possible subacute cerebellar stroke by imaging. Normal brain MRI. Nephrology consulted for worsening renal function, but felt to be improving enough with IV lasix not to need HD.  Renal US negative.   Interventional cardiology reviewed films at length 08/24/18 and felt not to be candidate for PCI. Pt underwent R thoracentesis with 1.4L out 08/27/18. EP asked to see for ICD consideration pending clinical course. Pt stabilized and underwent ICD  placement 08/31/18.  HF team consulted 09/02/18 due to continued volume overload. Diuresed on IV lasix. Underwent repeat Thoracentesis 9/6 and 09/04/18 with 1.7L and 800 cc off, respectively. (Transudative by Light's criteria). Pt continued coumadin for LV thrombus and ? CVA.  Clomid was discontinued at discharge after discussing with patient. Concern for hypercoagulability with estrogen modulator.  Examined am of 09/06/18 and determined stable for home with close follow up as below. Will need follow up with renal. If Pleural effusions recur, may need consideration of Pleurx catheter. Overall down 18 lbs from highest weight this admission. He has been scheduled to establish care in coumadin clinic on [facility].   Discharge Weight Range: 197 lbs Discharge Vitals: Blood pressure 108/74, pulse 65, temperature (!) 97.4 F (36.3 C), temperature source Oral, resp. rate 13, height 5\' 9"  (1.753 m), weight 89.4 kg, SpO2 96 %.  Labs: Lab Results  Component Value Date   WBC 8.2 09/06/2018   HGB 11.5 (L) 09/06/2018   HCT 35.0 (L) 09/06/2018   MCV 84.7 09/06/2018   PLT 183 09/06/2018    Recent Labs  Lab 09/06/18 0336  NA 133*  K 2.8*  CL 91*  CO2 31  BUN 43*  CREATININE 2.59*  CALCIUM 8.2*  GLUCOSE 202*   Lab Results  Component Value Date   CHOL 111 08/22/2018   HDL 32 (L) 08/22/2018   LDLCALC 59 08/22/2018   TRIG 98 08/22/2018   BNP (last 3 results) Recent Labs    08/15/18 1649 08/20/18 1258  BNP 930.7* 1,184.0*    ProBNP (last 3 results) No results for input(s):  PROBNP in the last 8760 hours.   Diagnostic Studies/Procedures   No results found.  Discharge Medications   Allergies as of 09/06/2018   No Known Allergies     Medication List    STOP taking these medications   clomiPHENE 50 MG tablet Commonly known as:  CLOMID   losartan 100 MG tablet Commonly known as:  COZAAR   verapamil 240 MG 24 hr capsule Commonly known as:  VERELAN PM     TAKE these medications     amiodarone 200 MG tablet Commonly known as:  PACERONE Take 1 tablet (200 mg total) by mouth daily.   APPLE CIDER VINEGAR PLUS Tabs Take 1 tablet by mouth daily.   aspirin 81 MG EC tablet Take 1 tablet (81 mg total) by mouth daily. Start taking on:  09/07/2018   atorvastatin 40 MG tablet Commonly known as:  LIPITOR Take 1 tablet (40 mg total) by mouth daily at 6 PM.   carvedilol 6.25 MG tablet Commonly known as:  COREG Take 0.5 tablets (3.125 mg total) by mouth 2 (two) times daily with a meal. What changed:  how much to take   docusate sodium 100 MG capsule Commonly known as:  COLACE Take 1 capsule (100 mg total) by mouth 2 (two) times daily as needed for mild constipation.   DUET DHA 430 PO Take 1 capsule by mouth daily.   feeding supplement (GLUCERNA SHAKE) Liqd Take 237 mLs by mouth 2 (two) times daily between meals.   glimepiride 4 MG tablet Commonly known as:  AMARYL Take 4 mg by mouth 2 (two) times daily.   LORazepam 1 MG tablet Commonly known as:  ATIVAN Take 0.5-1 mg by mouth daily as needed for anxiety.   metFORMIN 500 MG tablet Commonly known as:  GLUCOPHAGE Take 500 mg by mouth 3 (three) times daily.   multivitamin with minerals Tabs tablet Take 1 tablet by mouth daily. Start taking on:  09/07/2018   Omega 3 340 MG Cpdr Take 1 capsule by mouth at bedtime.   polyethylene glycol packet Commonly known as:  MIRALAX / GLYCOLAX Take 17 g by mouth daily as needed for moderate constipation.   potassium chloride SA 20 MEQ tablet Commonly known as:  K-DUR,KLOR-CON Take 2 tablets (40 mEq total) by mouth daily.   sertraline 50 MG tablet Commonly known as:  ZOLOFT Take 25 mg by mouth daily.   torsemide 20 MG tablet Commonly known as:  DEMADEX Take 3 tablets (60 mg total) by mouth daily. Start taking on:  09/07/2018   traZODone 50 MG tablet Commonly known as:  DESYREL Take 50 mg by mouth at bedtime as needed.   warfarin 2 MG tablet Commonly known as:   COUMADIN Take 1 tablet (2 mg total) by mouth daily.       Disposition   The patient will be discharged in stable condition to home with very close follow up as below.   Discharge Instructions    (HEART FAILURE PATIENTS) Call MD:  Anytime you have any of the following symptoms: 1) 3 pound weight gain in 24 hours or 5 pounds in 1 week 2) shortness of breath, with or without a dry hacking cough 3) swelling in the hands, feet or stomach 4) if you have to sleep on extra pillows at night in order to breathe.   Complete by:  As directed    Amb Referral to Cardiac Rehabilitation   Complete by:  As directed    Diagnosis:  NSTEMI Heart Failure (see criteria below if ordering Phase II)     Heart Failure Type:  Chronic Systolic   Call MD for:  difficulty breathing, headache or visual disturbances   Complete by:  As directed    Call MD for:  persistant dizziness or light-headedness   Complete by:  As directed    Diet - low sodium heart healthy   Complete by:  As directed    2 gram sodium restriction 2 liter fluid restriction   Heart Failure patients record your daily weight using the same scale at the same time of day   Complete by:  As directed    Increase activity slowly   Complete by:  As directed    STOP any activity that causes chest pain, shortness of breath, dizziness, sweating, or exessive weakness   Complete by:  As directed      Follow-up Information    Daisy Floro, MD.   Specialty:  Family Medicine Contact information: [address] [place] [address] [phone]        [facility] [address] [facility] Follow up on 09/13/2018.   Specialty:  Cardiology Why:  09/13/18 2:30 PM: Coumadin Clinic (new patient) 09/13/18 3:00PM: Wound Check visit Contact information: [address] [place] [address] [phone]       Marinus Maw, MD Follow up on 12/01/2018.   Specialty:  Cardiology Why:  3:00PM Contact  information: [address] [place] [address] [phone]        [facility] Follow up on 09/09/2018.   Specialty:  Cardiology Why:  Heart Failure Followup at [facility]-1:20pm-Park/Dropoff at ER lot (Enter under blue "Specialty Clinics" awning) or under [facility] on [address] ([employer], garage code: [id number], elevator 1st floor). Take all am meds, bring all med bottles. Contact information: [address] [id number] mc [place] [address] [phone]            Duration of Discharge Encounter: Greater than 35 minutes   Signed, Alford Highland, NP 09/06/2018, 3:26 PM   Patient seen and examined with the above-signed Advanced Practice Provider and/or Housestaff. I personally reviewed laboratory data, imaging studies and relevant notes. I independently examined the patient and formulated the important aspects of the plan. I have edited the note to reflect any of my changes or salient points. I have personally discussed the plan with the patient and/or family.  He is much improved today. Volume status looks better. Probably as good as we can get it. Lungs much clearer after bilateral thoracenteses. Supp K. I will see in clinic later this week.   Arvilla Meres, MD  8:40 PM

## 2018-09-06 NOTE — Progress Notes (Signed)
CARDIAC REHAB PHASE I   Offered to walk with pt, pt states he has been up walking this am and even did some stairs with PT. Pt states he "feels good and is ready to go". Reviewed d/c ed with pt and wife. Pt states understanding of need for daily weights. Reviewed restrictions and exercise guidelines. Pt agreeable for CRP II, pt referred to [place]. Pt and wife deny any questions.  [id number] Reynold Bowen, RN BSN 09/06/2018 10:23 AM

## 2018-09-06 NOTE — Care Management Note (Signed)
Case Management Note   Patient Details  Name: Jose Myers MRN: [id number] Date of Birth: 05-15-1956  Subjective/Objective:    NSTEMI               Action/Plan: Patient lives at home with spouse; PCP: Daisy Floro, MD; has private insurance with BCBS with prescription drug coverage; Multivessel Disease, LV Thrombus. CM will continue to follow for progression of care.  Expected Discharge Date:  Undetermined at this time, pt being transferred to the ICU              Expected Discharge Plan:   TBD  Status of Service:   In progress  09/06/2018  Pt will discharge home today in care of wife - wife will provide 24 hour supervision.  Pt interested in cardiac rehab - pt declined outpt PT.   Pt confirmed he has PCP and denied barriers with obtaining/paying for medications.  NO CM needs determined at this time this note was written  09/03/18  Pt walked with cardiac rehab yesterday - pt maintained sats on RA.  Pt will need to be evaluated prior to discharge and ambulation test will need to be documented in system in order to determine if pt will qualify for insurance coverage of home oxygen.  CM also requested attending to order outpt PT.   09/01/18 Pt will need oxygen at discharge - amb note in [facility] given pending referral - CM requested order.  Per bedside nurse pt is ambulatory in room. Samuel Bouche [phone] 09/06/2018, 10:41 AM

## 2018-09-06 NOTE — Telephone Encounter (Signed)
Notes recorded by Sigurd Sos, RN on 09/06/2018 at 2:12 PM EDT Spoke with patient in regards to OV f/u and labs to be drawn (Potassium level) 9/10. Dr Tenny Craw' recommendations were discussed with the patient and the patient, who is still awaiting d/c, said that he is going home later today and sleeping all day tomorrow so will not be coming to office for lab work or OV. He said, "the only physician who he is taking instructions from is Dr Jesusita Oka, so I need to talk to him." Please advise, thank you for your help. ------

## 2018-09-06 NOTE — Progress Notes (Addendum)
ANTICOAGULATION CONSULT NOTE   Pharmacy Consult: Warfarin Indication: chest pain/ACS/LV apical thrombus  No Known Allergies  Patient Measurements: Height: 5\' 9"  (175.3 cm) Weight: 197 lb 1.5 oz (89.4 kg) IBW/kg (Calculated) : 70.7 Heparin Dosing Weight: 90 kg  Vital Signs: Temp: 97.6 F (36.4 C) (09/09 0744) Temp Source: Oral (09/09 0744) BP: 113/79 (09/09 0744) Pulse Rate: 65 (09/09 0342)  Labs: Recent Labs    09/04/18 0159 09/05/18 0217 09/06/18 0336  HGB 11.3* 12.0* 11.5*  HCT 35.3* 36.8* 35.0*  PLT 202 218 183  LABPROT 22.5* 27.4* 26.6*  INR 2.00 2.57 2.48  CREATININE 2.60* 2.57* 2.59*    Estimated Creatinine Clearance: 31.9 mL/min (A) (by C-G formula based on SCr of 2.59 mg/dL (H)).  Assessment: 77 yoM presents with 9 days of abdominal pain and increasing distension. Pt is s/p cath on 8/22 which showed multivessel CAD but not surgical/PCI candidate. He is noted with LV thrombus (now absent on repeat echo and chronic infarct). Patient was not on anticoagulation PTA. S/p thoracentesis.  INR stabilizing at 2.48 this morning. CBC stable overnight. No bleeding issues noted.    Goal of Therapy:  Monitor platelets by anticoagulation protocol: Yes  INR 2-3   Plan:  Warfarin 2mg  tonight - would consider 2mg  daily at discharge if home this afternoon INR daily for now  Sheppard Coil PharmD., BCPS Clinical Pharmacist 09/06/2018 10:30 AM

## 2018-09-06 NOTE — Telephone Encounter (Signed)
Per Duwaine Maxin, NP and Dr Gala Romney pt is sch for f/u in our clinic on Thursday 9/12 and can have labs checked at that time

## 2018-09-06 NOTE — Telephone Encounter (Signed)
Thanks.  I didn't see that

## 2018-09-06 NOTE — Progress Notes (Addendum)
Patient ID: Jose Myers, male   DOB: [birth year], 62 y.o.   MRN: [id number]     Advanced Heart Failure Rounding Note  PCP-Cardiologist: [name], MD   Subjective:    Weight down again with diuresis. Cr 2.39 -> 2.49 -> 2.6 -> 2.57 -> 2.59.  K 2.8 this am. Negative 1.4 L on po torsemide. Weight down another 3 lbs  Feeling great this am, "the best he has felt" in some time. Would like to go home. Denies SOB, CP, or pain.  No orthopnea or PND. Rhythm stable.   He had right thoracentesis 9/6, 1.7 L off. He had left thoracentesis 9/7, 800 cc off (transudate by Light's criteria).    Renal US 09/03/18 unremarkable.   Objective:   Weight Range: 89.4 kg Body mass index is 29.11 kg/m.   Vital Signs:   Temp:  [97.4 F (36.3 C)-98 F (36.7 C)] 97.6 F (36.4 C) (09/09 0744) Pulse Rate:  [65-69] 65 (09/09 0342) Resp:  [12-24] 14 (09/09 0744) BP: (86-113)/(57-80) 113/79 (09/09 0744) SpO2:  [94 %-98 %] 96 % (09/09 0342) Weight:  [89.4 kg] 89.4 kg (09/09 0342) Last BM Date: 09/05/18  Weight change: Filed Weights   09/03/18 0542 09/05/18 0604 09/06/18 0342  Weight: 93.7 kg 91.1 kg 89.4 kg   Intake/Output:   Intake/Output Summary (Last 24 hours) at 09/06/2018 0914 Last data filed at 09/06/2018 0827 Gross per 24 hour  Intake 720 ml  Output 2425 ml  Net -1705 ml    Physical Exam   General: NAD  HEENT: Normal anicteric Neck: Supple. JVP 6-7 cm. Carotids 2+ bilat; no bruits. No thyromegaly or nodule noted. Cor: PMI nondisplaced. RRR, No M/G/R noted Lungs: Decreased at bases Abdomen: Soft, non-tender, non-distended, no HSM. No bruits or masses. +BS  Extremities: no cyanosis, clubbing, rash, edema Neuro: alert & oriented x 3, cranial nerves grossly intact. moves all 4 extremities w/o difficulty. Affect pleasant   Telemetry   NSR 80s, personally reviewed.  EKG    No new tracings.  Labs    CBC Recent Labs    09/05/18 0217 09/06/18 0336  WBC 11.2* 8.2    HGB 12.0* 11.5*  HCT 36.8* 35.0*  MCV 85.6 84.7  PLT 218 183   Basic Metabolic Panel Recent Labs    40/98/11 0159 09/05/18 0217 09/06/18 0336  NA 133* 134* 133*  K 3.2* 3.3* 2.8*  CL 95* 93* 91*  CO2 27 29 31   GLUCOSE 191* 157* 202*  BUN 45* 42* 43*  CREATININE 2.60* 2.57* 2.59*  CALCIUM 8.4* 8.4* 8.2*  MG 2.1  --  1.9   Liver Function Tests No results for input(s): AST, ALT, ALKPHOS, BILITOT, PROT, ALBUMIN in the last 72 hours. No results for input(s): LIPASE, AMYLASE in the last 72 hours. Cardiac Enzymes No results for input(s): CKTOTAL, CKMB, CKMBINDEX, TROPONINI in the last 72 hours.  BNP: BNP (last 3 results) Recent Labs    08/15/18 1649 08/20/18 1258  BNP 930.7* 1,184.0*    ProBNP (last 3 results) No results for input(s): PROBNP in the last 8760 hours.   D-Dimer No results for input(s): DDIMER in the last 72 hours. Hemoglobin A1C No results for input(s): HGBA1C in the last 72 hours. Fasting Lipid Panel No results for input(s): CHOL, HDL, LDLCALC, TRIG, CHOLHDL, LDLDIRECT in the last 72 hours. Thyroid Function Tests No results for input(s): TSH, T4TOTAL, T3FREE, THYROIDAB in the last 72 hours.  Invalid input(s): FREET3  Other results:   Imaging  No results found.   Medications:     Scheduled Medications: . amiodarone  200 mg Oral BID  . aspirin EC  81 mg Oral Daily  . atorvastatin  40 mg Oral q1800  . carvedilol  3.125 mg Oral BID WC  . feeding supplement (GLUCERNA SHAKE)  237 mL Oral BID BM  . insulin aspart  0-15 Units Subcutaneous TID WC  . insulin aspart  0-5 Units Subcutaneous QHS  . insulin glargine  10 Units Subcutaneous Daily  . mouth rinse  15 mL Mouth Rinse BID  . multivitamin with minerals  1 tablet Oral Daily  . omega-3 acid ethyl esters  1 g Oral QHS  . patient's guide to using coumadin book   Does not apply Once  . potassium chloride  40 mEq Oral Once  . sertraline  25 mg Oral Daily  . torsemide  60 mg Oral Daily  .  Warfarin - Pharmacist Dosing Inpatient   Does not apply q1800    Infusions: . sodium chloride 250 mL (09/01/18 1008)    PRN Medications: sodium chloride, acetaminophen, acetaminophen-codeine, docusate sodium, fentaNYL, Influenza vac split quadrivalent PF, LORazepam, ondansetron (ZOFRAN) IV, polyethylene glycol, promethazine, promethazine, RESOURCE THICKENUP CLEAR, simethicone, sodium chloride flush, traZODone  Patient Profile   Jose Myers is a 62 y.o. 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   Admitted for NSTEMI and found to have severe multivessel CAD with no revascularization options. Course complicated by VT and PEA arrest, s/p ICD implant. HF team asked to see due to continued volume overload.   Assessment/Plan   1. Acute on chronic systolic CHF: Ischemic cardiomyopathy, Echo 08/20/18 EF 25-30% by echo.   - Cr relatively stable at 2.59.  - Continue torsemide 60 mg daily.  - Continue coreg 3.125 mg BID.  - ACE/ARB/spironolactone/digoxin not started with elevated creatinine.  2. Severe multivessel CAD: Not thought to be CABG candidate.  Dr. Gala Romney previously reviewed with interventional team and felt there was possibility of high-risk PCI of LM into LCX, would defer for now with creatinine elevation.  May be able to revisit down the road.  - No s/s of ischemia.    - Continue ASA/statin. - Also on coumadin for LV thrombus and ?CVA.  3. LV Thrombus:  - INR 2.48 today. First noted on cath, but noted to gone on Echo post cardiac arrest. INR therapeutic now.  4. CKD III-IV: Creatinine 0.98 on [facility] lab report in 10/17 and 0.96 10/2014 (Renal states baseline 1.6-1.8).  Creatinine 2.59 on admission. Has been as low as 1.6 and as high as 3.6. Cr 2.39 -> 2.49 -> 2.6 -> 2.57.  UA + protein. Renal US negative.  - Cr 2.59 this am.  - Stable for now on po torsemide.  - He was not established  with Renal prior to admission, but will need follow up. Discussed with patient.  5. S/p VT arrest 08/20/18:  - Now s/p ICD 08/31/18.  - Reminded no driving for 6 months.   6. Acute respiratory failure: In setting of cardiac arrest. Now resolved.  - Stable on room air.   7. DM2: SSI.  - Hgb A1C 11.5.  - If GFR improves should consider jardiance 8. ?CVA: Head CT 08/22/18 with small hypodensity within the right inferior cerebellum which may represent an acute or subacute infarction. Mild/Mod chronic microvascular ischemic changes. 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 MRI brain 08/22/18 negative for acute stroke, indicating CT concern was artifact. Neuro signed off 08/23/18. Overall thought to not have acute stroke, but history of previous by imaging.  - On coumadin for LV thrombus as above.  - No change to current plan.   9. Bilateral Pleural Effusions:  - Had right thoracentesis 9/6, left thoracentesis 9/7.  Transudative by Light's criteria.  No change.  10. Deconditioning:  - PT has seen and recommending PT. 11. Hypokalemia - K 2.8 this am. Will supp. Discussed dosing with Pharm-D.   Length of Stay: [address]  Graciella Freer, [place]  09/06/2018, 9:14 AM  Advanced Heart Failure Team Pager [phone] (M-F; 7a - 4p)  Please contact [facility] for night-coverage after hours (4p -7a ) and weekends on [email or web address]  Patient seen and examined with the above-signed Advanced Practice Provider and/or Housestaff. I personally reviewed laboratory data, imaging studies and relevant notes. I independently examined the patient and formulated the important aspects of the plan. I have edited the note to reflect any of my changes or salient points. I have personally discussed the plan with the patient and/or family.  He is much improved today. Volume status looks better. Probably as good as we can get it. Lungs much clearer after bilateral thoracenteses. Supp  K. I will see in clinic later this week.   Arvilla Meres, MD  8:40 PM

## 2018-09-06 NOTE — Progress Notes (Signed)
SWOT RN assisted with d/c. Pt/family educated; IV removed; pt walked hallways & tolerated well; wheeled out by NT.   [place]  Tika Hannis, RN

## 2018-09-06 NOTE — Telephone Encounter (Signed)
-----   Message from Pricilla Riffle, MD sent at 09/06/2018 12:16 PM EDT ----- Pt just discharged today Got 40 KCL prior to d/c    Needs a BMET tomorrow  Send stat Make sure he has f/u in clinic in week or less Long hospitatization   Multiple issues

## 2018-09-06 NOTE — Progress Notes (Signed)
Physical Therapy Treatment Patient Details Name: Jose Myers MRN: [id number] DOB: [birth year] Today's Date: 09/06/2018    History of Present Illness 62 y.o. male admitted 08/15/18 with NSTEMI, presenting with systolic CHF and acute renal insufficiency.  EF 25-30% with LV thrombus, not a surgical candidate. On 8/23, he went into v-tach arrest, CPR, epi x2,  defibrillated x1. Code stroke called on 8/24 for speech difficulty and confusion, MRI negative, CT showed possible cerebellum infarct, which neurology believes may be artifact. Rt thoracentesis 9/6, Lt thoracentesis 9/7. PMHx: CAD, DM, HTN    PT Comments    Pt pleasant on arrival, excited to be "leaving soon." Pt able to progress ambulation to 700 ft w/ no AD and supervision for safety. Pt w/ overall increased steadiness today, (see DGI score below) but will still benefit from further therapy for increased balance. Pt able to navigate stairs w/ supervision. Pt educated on sternal precautions. Pt and wife educated on need for continued supervision at home for balance with uneven surfaces (like hill in driveway) and stairs. Wife is able to assist.     Follow Up Recommendations  Outpatient PT;Supervision for mobility/OOB     Equipment Recommendations  None recommended by PT    Recommendations for Other Services       Precautions / Restrictions Precautions Precautions: Fall;ICD/Pacemaker Restrictions Weight Bearing Restrictions: No    Mobility  Bed Mobility Overal bed mobility: Modified Independent Bed Mobility: Supine to Sit     Supine to sit: Modified independent (Device/Increase time)     General bed mobility comments: HOB slightly elevated   Transfers Overall transfer level: Needs assistance Equipment used: None Transfers: Sit to/from Stand Sit to Stand: Supervision         General transfer comment: supervision for safety  Ambulation/Gait Ambulation/Gait assistance: Supervision Gait Distance (Feet): 700  Feet Assistive device: None Gait Pattern/deviations: Step-through pattern;Decreased stride length Gait velocity: slight decrease Gait velocity interpretation: >2.62 ft/sec, indicative of community ambulatory General Gait Details: performed DGI, results below. Pt w/ increased steadiness w/ occasional bobble with challenge. SpO2 91-94 on RA throughout activity.    Stairs Stairs: Yes Stairs assistance: Supervision Stair Management: One rail Right Number of Stairs: 5 General stair comments: use of rail on R to ascend and on L for descent. overall steady, suggest supervision at home, wife able to assist.    Wheelchair Mobility    Modified Rankin (Stroke Patients Only)       Balance Overall balance assessment: Independent Sitting-balance support: Feet supported;No upper extremity supported Sitting balance-Leahy Scale: Good       Standing balance-Leahy Scale: Fair Standing balance comment: able to shift weight w/o support.                High Level Balance Comments: pt w/ some loss of balance, when looking to the right, would sway left  Standardized Balance Assessment Standardized Balance Assessment : Dynamic Gait Index   Dynamic Gait Index Level Surface: Normal Change in Gait Speed: Normal Gait with Horizontal Head Turns: Mild Impairment Gait with Vertical Head Turns: Mild Impairment Gait and Pivot Turn: Mild Impairment Step Over Obstacle: Mild Impairment Step Around Obstacles: Normal Steps: Mild Impairment Total Score: 19      Cognition Arousal/Alertness: Awake/alert Behavior During Therapy: WFL for tasks assessed/performed Overall Cognitive Status: Within Functional Limits for tasks assessed  Exercises      General Comments        Pertinent Vitals/Pain Pain Assessment: No/denies pain    Home Living                      Prior Function            PT Goals (current goals can now be  found in the care plan section) Progress towards PT goals: Progressing toward goals    Frequency    Min 3X/week      PT Plan Current plan remains appropriate    Co-evaluation              AM-PAC PT "6 Clicks" Daily Activity  Outcome Measure  Difficulty turning over in bed (including adjusting bedclothes, sheets and blankets)?: None Difficulty moving from lying on back to sitting on the side of the bed? : None Difficulty sitting down on and standing up from a chair with arms (e.g., wheelchair, bedside commode, etc,.)?: None Help needed moving to and from a bed to chair (including a wheelchair)?: None Help needed walking in hospital room?: A Little Help needed climbing 3-5 steps with a railing? : A Little 6 Click Score: 22    End of Session Equipment Utilized During Treatment: Gait belt Activity Tolerance: Patient tolerated treatment well Patient left: in chair;with call bell/phone within reach;with family/visitor present Nurse Communication: Mobility status PT Visit Diagnosis: Unsteadiness on feet (R26.81)     Time: 1610-9604 PT Time Calculation (min) (ACUTE ONLY): 14 min  Charges:  $Gait Training: 8-22 mins                     Carma Lair, [place]  Acute Rehab [phone]    Carma Lair 09/06/2018, 10:31 AM

## 2018-09-09 ENCOUNTER — Ambulatory Visit (HOSPITAL_COMMUNITY)
Admission: RE | Admit: 2018-09-09 | Discharge: 2018-09-09 | Disposition: A | Discharge status: Home / Self Care | Payer: BLUE CROSS/BLUE SHIELD | Source: Ambulatory Visit | Attending: Internal Medicine | Admitting: Internal Medicine

## 2018-09-09 ENCOUNTER — Telehealth (HOSPITAL_COMMUNITY): Payer: Self-pay

## 2018-09-09 ENCOUNTER — Ambulatory Visit (HOSPITAL_COMMUNITY)
Admit: 2018-09-09 | Discharge: 2018-09-09 | Disposition: A | Discharge status: Home / Self Care | Payer: BLUE CROSS/BLUE SHIELD | Source: Ambulatory Visit | Attending: Internal Medicine | Admitting: Internal Medicine

## 2018-09-09 VITALS — BP 136/80 | HR 85 | Wt 194.8 lb

## 2018-09-09 DIAGNOSIS — Z7982 Long term (current) use of aspirin: Secondary | ICD-10-CM | POA: Diagnosis not present

## 2018-09-09 DIAGNOSIS — I255 Ischemic cardiomyopathy: Secondary | ICD-10-CM | POA: Diagnosis not present

## 2018-09-09 DIAGNOSIS — E1122 Type 2 diabetes mellitus with diabetic chronic kidney disease: Secondary | ICD-10-CM | POA: Diagnosis not present

## 2018-09-09 DIAGNOSIS — I5022 Chronic systolic (congestive) heart failure: Secondary | ICD-10-CM | POA: Diagnosis present

## 2018-09-09 DIAGNOSIS — R109 Unspecified abdominal pain: Secondary | ICD-10-CM

## 2018-09-09 DIAGNOSIS — Z79899 Other long term (current) drug therapy: Secondary | ICD-10-CM | POA: Diagnosis not present

## 2018-09-09 DIAGNOSIS — J90 Pleural effusion, not elsewhere classified: Secondary | ICD-10-CM | POA: Insufficient documentation

## 2018-09-09 DIAGNOSIS — I236 Thrombosis of atrium, auricular appendage, and ventricle as current complications following acute myocardial infarction: Secondary | ICD-10-CM | POA: Diagnosis not present

## 2018-09-09 DIAGNOSIS — Z7984 Long term (current) use of oral hypoglycemic drugs: Secondary | ICD-10-CM | POA: Diagnosis not present

## 2018-09-09 DIAGNOSIS — Z95810 Presence of automatic (implantable) cardiac defibrillator: Secondary | ICD-10-CM | POA: Insufficient documentation

## 2018-09-09 DIAGNOSIS — R112 Nausea with vomiting, unspecified: Secondary | ICD-10-CM | POA: Insufficient documentation

## 2018-09-09 DIAGNOSIS — N183 Chronic kidney disease, stage 3 (moderate): Secondary | ICD-10-CM | POA: Insufficient documentation

## 2018-09-09 DIAGNOSIS — E785 Hyperlipidemia, unspecified: Secondary | ICD-10-CM | POA: Diagnosis not present

## 2018-09-09 DIAGNOSIS — I2510 Atherosclerotic heart disease of native coronary artery without angina pectoris: Secondary | ICD-10-CM | POA: Insufficient documentation

## 2018-09-09 DIAGNOSIS — Z833 Family history of diabetes mellitus: Secondary | ICD-10-CM | POA: Diagnosis not present

## 2018-09-09 DIAGNOSIS — Z808 Family history of malignant neoplasm of other organs or systems: Secondary | ICD-10-CM | POA: Diagnosis not present

## 2018-09-09 DIAGNOSIS — I130 Hypertensive heart and chronic kidney disease with heart failure and stage 1 through stage 4 chronic kidney disease, or unspecified chronic kidney disease: Secondary | ICD-10-CM | POA: Diagnosis not present

## 2018-09-09 DIAGNOSIS — Z8674 Personal history of sudden cardiac arrest: Secondary | ICD-10-CM | POA: Diagnosis not present

## 2018-09-09 DIAGNOSIS — Z7901 Long term (current) use of anticoagulants: Secondary | ICD-10-CM | POA: Diagnosis not present

## 2018-09-09 LAB — CBC
HCT: 40.2 % (ref 39.0–52.0)
HEMOGLOBIN: 12.9 g/dL — AB (ref 13.0–17.0)
MCH: 27.7 pg (ref 26.0–34.0)
MCHC: 32.1 g/dL (ref 30.0–36.0)
MCV: 86.5 fL (ref 78.0–100.0)
PLATELETS: 250 10*3/uL (ref 150–400)
RBC: 4.65 MIL/uL (ref 4.22–5.81)
RDW: 14.5 % (ref 11.5–15.5)
WBC: 10.1 10*3/uL (ref 4.0–10.5)

## 2018-09-09 LAB — COMPREHENSIVE METABOLIC PANEL
ALK PHOS: 66 U/L (ref 38–126)
ALT: 17 U/L (ref 0–44)
AST: 26 U/L (ref 15–41)
Albumin: 3 g/dL — ABNORMAL LOW (ref 3.5–5.0)
Anion gap: 12 (ref 5–15)
BUN: 23 mg/dL (ref 8–23)
CALCIUM: 8.9 mg/dL (ref 8.9–10.3)
CO2: 27 mmol/L (ref 22–32)
CREATININE: 2.27 mg/dL — AB (ref 0.61–1.24)
Chloride: 98 mmol/L (ref 98–111)
GFR calc Af Amer: 33 mL/min — ABNORMAL LOW (ref >60)
GFR, EST NON AFRICAN AMERICAN: 29 mL/min — AB (ref >60)
Glucose, Bld: 151 mg/dL — ABNORMAL HIGH (ref 70–99)
Potassium: 3.1 mmol/L — ABNORMAL LOW (ref 3.5–5.1)
Sodium: 137 mmol/L (ref 135–145)
Total Bilirubin: 1.2 mg/dL (ref 0.3–1.2)
Total Protein: 6.2 g/dL — ABNORMAL LOW (ref 6.5–8.1)

## 2018-09-09 LAB — PROTIME-INR
INR: 2
Prothrombin Time: 22.5 seconds — ABNORMAL HIGH (ref 11.4–15.2)

## 2018-09-09 LAB — LIPASE, BLOOD: Lipase: 65 U/L — ABNORMAL HIGH (ref 11–51)

## 2018-09-09 LAB — AMYLASE: Amylase: 51 U/L (ref 28–100)

## 2018-09-09 MED ORDER — ONDANSETRON 4 MG PO TBDP: 4.0000 mg | ORAL_TABLET | Freq: Three times a day (TID) | ORAL | 0 refills | Status: AC | PRN

## 2018-09-09 MED ORDER — ONDANSETRON HCL 4 MG/2ML IJ SOLN
4.0000 mg | Freq: Once | INTRAMUSCULAR | Status: AC
  Administered 2018-09-09: 4 mg via INTRAMUSCULAR
  Filled 2018-09-09: qty 2

## 2018-09-09 MED ORDER — POTASSIUM CHLORIDE CRYS ER 20 MEQ PO TBCR: 60.0000 meq | EXTENDED_RELEASE_TABLET | Freq: Every day | ORAL | 6 refills | Status: AC

## 2018-09-09 NOTE — Progress Notes (Signed)
Pt back from radiology, he states he feels a little better, no more vomiting or heaving at this time.  Will await test rests.

## 2018-09-09 NOTE — Addendum Note (Signed)
Encounter addended by: Lattie Hawurrie, Rickell Wiehe I, RN on: 09/09/2018 2:28 PM  Actions taken: Sign clinical note

## 2018-09-09 NOTE — Addendum Note (Signed)
Encounter addended by: Noralee SpaceSchub, Giulianna Rocha M, RN on: 09/09/2018 3:09 PM  Actions taken: Sign clinical note

## 2018-09-09 NOTE — Addendum Note (Signed)
Encounter addended by: Noralee SpaceSchub, Carrah Eppolito M, RN on: 09/09/2018 2:34 PM  Actions taken: Sign clinical note, MAR administration accepted

## 2018-09-09 NOTE — Addendum Note (Signed)
Encounter addended by: Dolores PattyBensimhon, Odell Fasching R, MD on: 09/09/2018 4:34 PM  Actions taken: Sign clinical note

## 2018-09-09 NOTE — Progress Notes (Signed)
Patient given 4 mg of zofran IM. Dose split between left and right deltoid muscle. Patient tolerated injections.

## 2018-09-09 NOTE — Patient Instructions (Signed)
Stop Amiodarone   Take an extra 40 meq (2 tabs) of potassium when you get home today  Starting tomorrow 09/10/18 please take KCL 60 meq (3 tabs) daily  Take Zofran as needed for nausea and vomiting  If abdominal does not get better please go to ER for further evaluation  Your physician recommends that you schedule a follow-up appointment in: 1 month

## 2018-09-09 NOTE — Progress Notes (Addendum)
Advanced Heart Failure Clinic Note   Referring Physician: PCP: Daisy Floro, MD PCP-Cardiologist: [name], MD   HPI:  Jose Myers is a 62 y.o. 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   Pt was admitted 08/15/18 with abdominal pain. Noted to have elevated troponin and EKG changes, volume overload, and AKI. Ruled in for NSTEMI and taken for cath pending renal function improvement and diuresis. Cath 08/19/18 showed severe multivessel CAD, including LM disease, CTS evaluation was done and felt to have poor target vessels and complete revascularization not possible. TCTS saw andfound to be too high risk for CABG, with preference to consider PCI to CABG, noting that Impella would not be an option given LV thrombus.  On 08/20/18, pt suffered a VT arrest requiring CPR, 2 shocks, 2 rounds of Epi, and intubation. Post arrest Echo 08/20/18 noted that apical thrombus was gone and EF of 25-30%. Pt extubated 08/21/18  On 08/22/18, Code stroke was called due to acute AMS. Neuro thought this was most c/w delirium, though noted possible subacute cerebellar stroke by imaging. Normal brain MRI. Nephrology consulted for worsening renal function, but felt to be improving enough with IV lasix not to need HD. Renal US negative.   Interventional cardiology reviewed films at length 08/24/18 and felt not to be candidate for PCI. Pt underwent R thoracentesis with 1.4L out 08/27/18. EP asked to see for ICD consideration pending clinical course. Pt stabilized and underwent ICD placement 08/31/18.  HF team consulted 09/02/18 due to continued volume overload. Diuresed on IV lasix. Underwent repeat Thoracentesis 9/6 and 09/04/18 with 1.7L and 800 cc off, respectively. (Transudative by Light's criteria). Pt continued coumadin for LV thrombus and ? CVA.  Clomid was discontinued at discharge after discussing with  patient. Concern for hypercoagulability with estrogen modulator.  He presents today for post hospital follow up.   Says he has been doing ok but has been struggling with gaseousness and n/v. Wakes up feeling pretty good but in the evening after taking pills develops n/v. Started having diarrhea yesterday. It is a "little dark". Soft stool. Denies melena. No SOB, orthopnea or PND. + LE edema.  Only new pills at night are warfarin and atorvastatin. Weight down 5 pounds since d/c. 198 -> 193.  No CP.   Review of systems complete and found to be negative unless listed in HPI.    Past Medical History:  Diagnosis Date  . Coronary artery disease   . Diabetes mellitus without complication (HCC)   . Hypertension     Current Outpatient Medications  Medication Sig Dispense Refill  . amiodarone (PACERONE) 200 MG tablet Take 1 tablet (200 mg total) by mouth daily. 30 tablet 6  . Apple Cid Vn-Grn Tea-Bit Or-Cr (APPLE CIDER VINEGAR PLUS) TABS Take 1 tablet by mouth daily.    Marland Kitchen aspirin 81 MG EC tablet Take 1 tablet (81 mg total) by mouth daily. 30 tablet 6  . atorvastatin (LIPITOR) 40 MG tablet Take 1 tablet (40 mg total) by mouth daily at 6 PM. 30 tablet 6  . carvedilol (COREG) 6.25 MG tablet Take 0.5 tablets (3.125 mg total) by mouth 2 (two) times daily with a meal. 30 tablet 6  . docusate sodium (COLACE) 100 MG capsule Take 1 capsule (100 mg total) by mouth 2 (two) times daily as needed for mild constipation. 10 capsule 0  . feeding supplement, GLUCERNA SHAKE, (GLUCERNA SHAKE) LIQD Take  237 mLs by mouth 2 (two) times daily between meals. 60 Can 0  . glimepiride (AMARYL) 4 MG tablet Take 4 mg by mouth 2 (two) times daily.    Marland Kitchen LORazepam (ATIVAN) 1 MG tablet Take 0.5-1 mg by mouth daily as needed for anxiety.    . metFORMIN (GLUCOPHAGE) 500 MG tablet Take 500 mg by mouth 3 (three) times daily.     . Multiple Vitamin (MULTIVITAMIN WITH MINERALS) TABS tablet Take 1 tablet by mouth daily. 30 tablet 6  .  Omega 3 340 MG CPDR Take 1 capsule by mouth at bedtime.    . polyethylene glycol (MIRALAX / GLYCOLAX) packet Take 17 g by mouth daily as needed for moderate constipation. 14 each 0  . potassium chloride SA (K-DUR,KLOR-CON) 20 MEQ tablet Take 2 tablets (40 mEq total) by mouth daily. 60 tablet 6  . Prenat-FePoly-Fered-FA-Omega 3 (DUET DHA 430 PO) Take 1 capsule by mouth daily.    . sertraline (ZOLOFT) 50 MG tablet Take 25 mg by mouth daily.  4  . torsemide (DEMADEX) 20 MG tablet Take 3 tablets (60 mg total) by mouth daily. 90 tablet 6  . traZODone (DESYREL) 50 MG tablet Take 50 mg by mouth at bedtime as needed.  0  . warfarin (COUMADIN) 2 MG tablet Take 1 tablet (2 mg total) by mouth daily. 30 tablet 11   No current facility-administered medications for this encounter.     No Known Allergies    Social History   Socioeconomic History  . Marital status: Married    Spouse name: Not on file  . Number of children: Not on file  . Years of education: Not on file  . Highest education level: Not on file  Occupational History  . Not on file  Social Needs  . Financial resource strain: Not on file  . Food insecurity:    Worry: Not on file    Inability: Not on file  . Transportation needs:    Medical: Not on file    Non-medical: Not on file  Tobacco Use  . Smoking status: Never Smoker  . Smokeless tobacco: Never Used  Substance and Sexual Activity  . Alcohol use: Yes    Comment: Occasionally.  . Drug use: Not on file  . Sexual activity: Not on file  Lifestyle  . Physical activity:    Days per week: Not on file    Minutes per session: Not on file  . Stress: Not on file  Relationships  . Social connections:    Talks on phone: Not on file    Gets together: Not on file    Attends religious service: Not on file    Active member of club or organization: Not on file    Attends meetings of clubs or organizations: Not on file    Relationship status: Not on file  . Intimate partner  violence:    Fear of current or ex partner: Not on file    Emotionally abused: Not on file    Physically abused: Not on file    Forced sexual activity: Not on file  Other Topics Concern  . Not on file  Social History Narrative  . Not on file      Family History  Problem Relation Age of Onset  . Diabetes Mellitus II Mother   . Throat cancer Brother     Vitals:   09/09/18 1327  BP: 136/80  Pulse: 85  SpO2: 94%  Weight: 88.4 kg (194 lb 12.8  oz)     PHYSICAL EXAM: General:  Sitting in chair. Wretching at times HEENT: normal Neck: supple. no JVD. Carotids 2+ bilat; no bruits. No lymphadenopathy or thyromegaly appreciated. Cor: PMI nondisplaced. Regular rate & rhythm. No rubs, gallops or murmurs. Lungs: clear Abdomen: soft, nontender, mildly distended/firm. No hepatosplenomegaly. No bruits or masses. Good bowel sounds. Extremities: no cyanosis, clubbing, rash, trace pedal edema Neuro: alert & oriented x 3, cranial nerves grossly intact. moves all 4 extremities w/o difficulty. Affect pleasant.   ASSESSMENT & PLAN:  1. Chronic chronic systolic CHF:  - Ischemic cardiomyopathy, Echo 08/20/18 EF 25-30% by echo.  - NYHA II-III  - Volume status looks good  - Continue torsemide 60 mg daily.  - Continue coreg 3.125 mg BID.  -ACE/ARB/spironolactone/digoxin not started with elevated creatinine. Will recheck renal function today and consider. f creatinine still up consider hydral/nitrates  - Reinforced fluid restriction to < 2 L daily, sodium restriction to less than 2000 mg daily, and the importance of daily weights.   2. Severe n/v - unclear etiology. Ab tight on exam but not acute. ? Constipation, gastroenteritis, diabetic gastroparesis vs med-related (?amio)  - will get labs and KUB to further evaluate. Give zofran in clinic. If no obvious cause will stop amio 3. Severe multivessel CAD:  - Not thought to be CABG candidate.  Cath fils previously reviewed with interventional team  and felt there was possibility of high-risk PCI of LM into LCX if he had further ischemic sx and creatinine improved  - Currently no s/s of ischemia.    - Continue ASA/statin. - Also on coumadin for LV thrombus and ?CVA.  4. LV Thrombus:  - INR per coumadin clinic. Goal 2.0-3.0 4. CKD III-IV: Creatinine 0.98 on [facility] lab report in 10/17and 0.96 10/2014(Renal states baseline 1.6-1.8).  Creatinine 2.59 on admission. Has been as low as 1.6 and as high as 3.6. Cr 2.39 -> 2.49 -> 2.6 -> 2.57. UA + protein. Renal US negative.  - Cr 2.59 on discharge. BMET today.  - Refer to Nephrology 5. S/p VT arrest 08/20/18:  - Now s/p ICD 08/31/18.  - No driving for 6 months.   6. DM2: SSI.  - Hgb A1C 11.5.  - IfGFR improves shouldconsider jardiance 7. ?CVA: Head CT 08/22/18 with small hypodensity within the right inferior cerebellum which may represent an acute or subacute infarction. Mild/Mod chronic microvascular ischemic changes. Small chronic infarct within the left putamen extending into corona radiata and caudate body. MRI brain 08/22/18 negative for acute stroke, indicating CT concern was artifact. Neuro signed off 08/23/18. Overall thought to not have acute stroke, but history of previous by imaging.  - On coumadin for LV thrombus as above. - No change to current plan.   8. Bilateral Pleural Effusions:  - Had right thoracentesis 9/6, left thoracentesis 9/7.  Transudative   Total time spent 45 minutes. Over half that time spent discussing above.   Arvilla Meresaniel Bensimhon, MD  2:10 PM   Addendum:  KUB reviewed normal bowel gas pattern.  Labs ok except for K 3.1. Lipase slightly elevated at 65. Recent abdomina CT reviewed and was normal.   Will supp K. Stop amio. And give zofran for symptom relief. If not improving will need further w/u.   Arvilla Meresaniel Bensimhon, MD  4:34 PM

## 2018-09-09 NOTE — Telephone Encounter (Signed)
Pt insurance is active and benefits verified through [employer]. Co-pay $0.00, DED $7,000.00/$7,000.00 met, out of pocket $7,900.00/$7,900.00 met, co-insurance 40%. No pre-authorization. Passport, 09/09/18 @ 11:50AM, BOB#[id number]  Will contact patient to see if he is interested in the Cardiac Rehab Program. If interested, patient will need to complete follow up appt. Once completed, patient will be contacted for scheduling upon review by the RN Navigator.

## 2018-09-09 NOTE — Addendum Note (Signed)
Encounter addended by: Noralee SpaceSchub, Earlin Sweeden M, RN on: 09/09/2018 3:41 PM  Actions taken: Sign clinical note, Order list changed, Medication long-term status modified

## 2018-09-09 NOTE — Addendum Note (Signed)
Encounter addended by: Noralee SpaceSchub, Sequoyah Counterman M, RN on: 09/09/2018 2:38 PM  Actions taken: Order list changed, Diagnosis association updated

## 2018-09-09 NOTE — Progress Notes (Signed)
Pt taken to radiology for stat KUB.

## 2018-09-11 ENCOUNTER — Telehealth: Payer: Self-pay | Admitting: Physician Assistant

## 2018-09-11 ENCOUNTER — Emergency Department (HOSPITAL_COMMUNITY): Payer: BLUE CROSS/BLUE SHIELD

## 2018-09-11 ENCOUNTER — Other Ambulatory Visit: Payer: Self-pay

## 2018-09-11 DIAGNOSIS — I130 Hypertensive heart and chronic kidney disease with heart failure and stage 1 through stage 4 chronic kidney disease, or unspecified chronic kidney disease: Principal | ICD-10-CM | POA: Diagnosis present

## 2018-09-11 DIAGNOSIS — K5900 Constipation, unspecified: Secondary | ICD-10-CM | POA: Diagnosis present

## 2018-09-11 DIAGNOSIS — J9621 Acute and chronic respiratory failure with hypoxia: Secondary | ICD-10-CM | POA: Diagnosis present

## 2018-09-11 DIAGNOSIS — E785 Hyperlipidemia, unspecified: Secondary | ICD-10-CM | POA: Diagnosis present

## 2018-09-11 DIAGNOSIS — E872 Acidosis, unspecified: Secondary | ICD-10-CM

## 2018-09-11 DIAGNOSIS — E1129 Type 2 diabetes mellitus with other diabetic kidney complication: Secondary | ICD-10-CM | POA: Diagnosis present

## 2018-09-11 DIAGNOSIS — Z9889 Other specified postprocedural states: Secondary | ICD-10-CM

## 2018-09-11 DIAGNOSIS — F4312 Post-traumatic stress disorder, chronic: Secondary | ICD-10-CM

## 2018-09-11 DIAGNOSIS — J90 Pleural effusion, not elsewhere classified: Secondary | ICD-10-CM

## 2018-09-11 DIAGNOSIS — E876 Hypokalemia: Secondary | ICD-10-CM | POA: Diagnosis present

## 2018-09-11 DIAGNOSIS — I700 Atherosclerosis of aorta: Secondary | ICD-10-CM | POA: Diagnosis present

## 2018-09-11 DIAGNOSIS — N179 Acute kidney failure, unspecified: Secondary | ICD-10-CM | POA: Diagnosis present

## 2018-09-11 DIAGNOSIS — J9690 Respiratory failure, unspecified, unspecified whether with hypoxia or hypercapnia: Secondary | ICD-10-CM

## 2018-09-11 DIAGNOSIS — I5023 Acute on chronic systolic (congestive) heart failure: Secondary | ICD-10-CM | POA: Diagnosis present

## 2018-09-11 DIAGNOSIS — R112 Nausea with vomiting, unspecified: Secondary | ICD-10-CM

## 2018-09-11 DIAGNOSIS — Z955 Presence of coronary angioplasty implant and graft: Secondary | ICD-10-CM

## 2018-09-11 DIAGNOSIS — F4322 Adjustment disorder with anxiety: Secondary | ICD-10-CM

## 2018-09-11 DIAGNOSIS — R0600 Dyspnea, unspecified: Secondary | ICD-10-CM

## 2018-09-11 DIAGNOSIS — I214 Non-ST elevation (NSTEMI) myocardial infarction: Secondary | ICD-10-CM | POA: Diagnosis present

## 2018-09-11 DIAGNOSIS — Z8674 Personal history of sudden cardiac arrest: Secondary | ICD-10-CM

## 2018-09-11 DIAGNOSIS — J189 Pneumonia, unspecified organism: Secondary | ICD-10-CM | POA: Diagnosis present

## 2018-09-11 DIAGNOSIS — I472 Ventricular tachycardia: Secondary | ICD-10-CM | POA: Diagnosis present

## 2018-09-11 DIAGNOSIS — E1122 Type 2 diabetes mellitus with diabetic chronic kidney disease: Secondary | ICD-10-CM | POA: Diagnosis present

## 2018-09-11 DIAGNOSIS — D689 Coagulation defect, unspecified: Secondary | ICD-10-CM | POA: Diagnosis present

## 2018-09-11 DIAGNOSIS — I255 Ischemic cardiomyopathy: Secondary | ICD-10-CM | POA: Diagnosis present

## 2018-09-11 DIAGNOSIS — Z808 Family history of malignant neoplasm of other organs or systems: Secondary | ICD-10-CM

## 2018-09-11 DIAGNOSIS — Z79899 Other long term (current) drug therapy: Secondary | ICD-10-CM

## 2018-09-11 DIAGNOSIS — N184 Chronic kidney disease, stage 4 (severe): Secondary | ICD-10-CM | POA: Diagnosis present

## 2018-09-11 DIAGNOSIS — J9601 Acute respiratory failure with hypoxia: Secondary | ICD-10-CM

## 2018-09-11 DIAGNOSIS — K760 Fatty (change of) liver, not elsewhere classified: Secondary | ICD-10-CM | POA: Diagnosis present

## 2018-09-11 DIAGNOSIS — Z833 Family history of diabetes mellitus: Secondary | ICD-10-CM

## 2018-09-11 DIAGNOSIS — R7989 Other specified abnormal findings of blood chemistry: Secondary | ICD-10-CM | POA: Diagnosis present

## 2018-09-11 DIAGNOSIS — I2510 Atherosclerotic heart disease of native coronary artery without angina pectoris: Secondary | ICD-10-CM | POA: Diagnosis present

## 2018-09-11 DIAGNOSIS — Z7982 Long term (current) use of aspirin: Secondary | ICD-10-CM

## 2018-09-11 DIAGNOSIS — R197 Diarrhea, unspecified: Secondary | ICD-10-CM | POA: Diagnosis present

## 2018-09-11 DIAGNOSIS — Z7901 Long term (current) use of anticoagulants: Secondary | ICD-10-CM

## 2018-09-11 DIAGNOSIS — L8990 Pressure ulcer of unspecified site, unspecified stage: Secondary | ICD-10-CM

## 2018-09-11 DIAGNOSIS — R778 Other specified abnormalities of plasma proteins: Secondary | ICD-10-CM | POA: Diagnosis present

## 2018-09-11 DIAGNOSIS — Z95810 Presence of automatic (implantable) cardiac defibrillator: Secondary | ICD-10-CM

## 2018-09-11 DIAGNOSIS — I513 Intracardiac thrombosis, not elsewhere classified: Secondary | ICD-10-CM | POA: Diagnosis present

## 2018-09-11 DIAGNOSIS — R570 Cardiogenic shock: Secondary | ICD-10-CM

## 2018-09-11 LAB — CBC
HEMATOCRIT: 39.5 % (ref 39.0–52.0)
HEMOGLOBIN: 12.8 g/dL — AB (ref 13.0–17.0)
MCH: 28.1 pg (ref 26.0–34.0)
MCHC: 32.4 g/dL (ref 30.0–36.0)
MCV: 86.8 fL (ref 78.0–100.0)
Platelets: 224 10*3/uL (ref 150–400)
RBC: 4.55 MIL/uL (ref 4.22–5.81)
RDW: 14.6 % (ref 11.5–15.5)
WBC: 12.4 10*3/uL — ABNORMAL HIGH (ref 4.0–10.5)

## 2018-09-11 LAB — BASIC METABOLIC PANEL
ANION GAP: 13 (ref 5–15)
BUN: 34 mg/dL — ABNORMAL HIGH (ref 8–23)
CHLORIDE: 95 mmol/L — AB (ref 98–111)
CO2: 26 mmol/L (ref 22–32)
Calcium: 8.6 mg/dL — ABNORMAL LOW (ref 8.9–10.3)
Creatinine, Ser: 3.02 mg/dL — ABNORMAL HIGH (ref 0.61–1.24)
GFR calc Af Amer: 24 mL/min — ABNORMAL LOW (ref >60)
GFR calc non Af Amer: 20 mL/min — ABNORMAL LOW (ref >60)
GLUCOSE: 199 mg/dL — AB (ref 70–99)
POTASSIUM: 4.4 mmol/L (ref 3.5–5.1)
Sodium: 134 mmol/L — ABNORMAL LOW (ref 135–145)

## 2018-09-11 LAB — I-STAT TROPONIN, ED: TROPONIN I, POC: 0.19 ng/mL — AB (ref 0.00–0.08)

## 2018-09-11 LAB — PROTIME-INR
INR: 2.5
PROTHROMBIN TIME: 26.8 s — AB (ref 11.4–15.2)

## 2018-09-11 LAB — POC OCCULT BLOOD, ED: FECAL OCCULT BLD: NEGATIVE

## 2018-09-11 LAB — TROPONIN I: Troponin I: 0.43 ng/mL (ref <0.03)

## 2018-09-11 MED ORDER — ONDANSETRON HCL 4 MG/2ML IJ SOLN
4.0000 mg | Freq: Once | INTRAMUSCULAR | Status: AC
  Administered 2018-09-12: 4 mg via INTRAVENOUS
  Filled 2018-09-11: qty 2

## 2018-09-11 MED ORDER — FUROSEMIDE 10 MG/ML IJ SOLN
60.0000 mg | Freq: Once | INTRAMUSCULAR | Status: DC
  Filled 2018-09-11: qty 6

## 2018-09-11 MED ORDER — FENTANYL CITRATE (PF) 100 MCG/2ML IJ SOLN
50.0000 ug | Freq: Once | INTRAMUSCULAR | Status: AC
  Administered 2018-09-12: 50 ug via INTRAVENOUS
  Filled 2018-09-11: qty 2

## 2018-09-11 NOTE — ED Triage Notes (Signed)
Patient c/o left, posterior rib pain and states that he believes that this is where they did CPR on him a little over a week ago. Also c/o SOB and states that he cannot lie flat without coughing and is so tired. Was just d/c'd from hospital 4 days ago.

## 2018-09-11 NOTE — ED Provider Notes (Signed)
[facility] EMERGENCY DEPARTMENT Provider Note   CSN: [id number] Arrival date & time: 09/09/2018  2212     History   Chief Complaint Chief Complaint  Patient presents with  . Shortness of Breath    HPI Jose Myers is a 62 y.o. male.  Jose Myers is a 62 y.o. 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 Patient recent admitted to the hospital on August 19 with an STEMI.  Underwent cardiac catheterization which showed multivessel disease and thought not a candidate for CABG or PCI.  Suffered cardiac arrest on August 23.  Found to have EF of 25% and apical thrombus on echocardiogram.  Patient saw his cardiologist Dr. Gala Romney 2 days ago for ongoing abdominal pain with diarrhea and vomiting.  He had a abdominal x-ray that was negative.  There are thoughts that this could be due to metformin use or amiodarone use.  Patient returns tonight with ongoing right upper back pain which he states is due to fractured ribs from CPR that has been constant since he left the hospital.  He is had multiple episodes of loose brown stool, denies any blood in the stool denies any melena.  Several episodes of vomiting and dry heaving.  No fever.  Denies chest pain. Feels that his weight has decreased in his leg swelling is better but still has trouble breathing when he tries to lay flat and cannot lay flat because of pain in his back and shortness of breath.   The history is provided by the patient.    Past Medical History:  Diagnosis Date  . Coronary artery disease   . Diabetes mellitus without complication (HCC)   . Hypertension     Patient Active Problem List   Diagnosis Date Noted  . Acute respiratory failure with hypoxia (HCC)   . Coronary artery disease involving native heart without angina pectoris   . LV (left ventricular) mural thrombus following MI (HCC)   . Delirium     . Encephalopathy   . Stroke due to embolism (HCC) 08/22/2018    Class: Acute  . Cardiac arrest (HCC) 08/20/2018  . NSTEMI (non-ST elevated myocardial infarction) (HCC)   . Hypervolemia   . Acute CHF (congestive heart failure) (HCC) 08/15/2018  . Acute renal failure (ARF) (HCC) 08/15/2018  . Elevated troponin 08/15/2018  . Abdominal pain 08/15/2018  . Type 2 diabetes mellitus with renal manifestations (HCC) 08/15/2018  . Pain in left knee 03/08/2018    Past Surgical History:  Procedure Laterality Date  . CARDIAC CATHETERIZATION    . CORONARY ANGIOGRAPHY N/A 08/19/2018   Procedure: CORONARY ANGIOGRAPHY (CATH LAB);  Surgeon: Lyn Records, MD;  Location[facility] INVASIVE CV LAB;  Service: Cardiovascular;  Laterality: N/A;  . EYE SURGERY    . ICD IMPLANT N/A 08/31/2018   Procedure: ICD IMPLANT;  Surgeon: Marinus Maw, MD;  Location: [facility] INVASIVE CV LAB;  Service: Cardiovascular;  Laterality: N/A;  . IR THORACENTESIS ASP PLEURAL SPACE W/IMG GUIDE  08/27/2018  . IR THORACENTESIS ASP PLEURAL SPACE W/IMG GUIDE  09/03/2018  . PERCUTANEOUS CORONARY STENT INTERVENTION (PCI-S)  04/11/2003   LAD  . RIGHT HEART CATH N/A 08/19/2018   Procedure: RIGHT HEART CATH;  Surgeon: Lyn Records, MD;  Location: [facility] INVASIVE CV LAB;  Service: Cardiovascular;  Laterality: N/A;        Home Medications    Prior to Admission medications  Medication Sig Start Date End Date Taking? Authorizing Provider  Apple Cid Vn-Grn Tea-Bit Or-Cr (APPLE CIDER VINEGAR PLUS) TABS Take 1 tablet by mouth daily.    [provider]  aspirin 81 MG EC tablet Take 1 tablet (81 mg total) by mouth daily. 09/07/18   Alford Highland, NP  atorvastatin (LIPITOR) 40 MG tablet Take 1 tablet (40 mg total) by mouth daily at 6 PM. 09/06/18   Alford Highland, NP  carvedilol (COREG) 6.25 MG tablet Take 0.5 tablets (3.125 mg total) by mouth 2 (two) times daily with a meal. 09/06/18   Alford Highland, NP  docusate sodium (COLACE) 100 MG capsule  Take 1 capsule (100 mg total) by mouth 2 (two) times daily as needed for mild constipation. 09/06/18   Alford Highland, NP  feeding supplement, GLUCERNA SHAKE, (GLUCERNA SHAKE) LIQD Take 237 mLs by mouth 2 (two) times daily between meals. 09/06/18   Alford Highland, NP  glimepiride (AMARYL) 4 MG tablet Take 4 mg by mouth 2 (two) times daily.    [provider]  LORazepam (ATIVAN) 1 MG tablet Take 0.5-1 mg by mouth daily as needed for anxiety.    [provider]  metFORMIN (GLUCOPHAGE) 500 MG tablet Take 500 mg by mouth 3 (three) times daily.     [provider]  Multiple Vitamin (MULTIVITAMIN WITH MINERALS) TABS tablet Take 1 tablet by mouth daily. 09/07/18   Alford Highland, NP  Omega 3 340 MG CPDR Take 1 capsule by mouth at bedtime.    [provider]  ondansetron (ZOFRAN-ODT) 4 MG disintegrating tablet Take 1 tablet (4 mg total) by mouth every 8 (eight) hours as needed for nausea or vomiting. 09/09/18   Bensimhon, Bevelyn Buckles, MD  polyethylene glycol (MIRALAX / GLYCOLAX) packet Take 17 g by mouth daily as needed for moderate constipation. 09/06/18   Alford Highland, NP  potassium chloride SA (K-DUR,KLOR-CON) 20 MEQ tablet Take 3 tablets (60 mEq total) by mouth daily. 09/09/18   Bensimhon, Bevelyn Buckles, MD  Prenat-FePoly-Fered-FA-Omega 3 (DUET DHA 430 PO) Take 1 capsule by mouth daily.    [provider]  sertraline (ZOLOFT) 50 MG tablet Take 25 mg by mouth daily. 07/13/18   [provider]  torsemide (DEMADEX) 20 MG tablet Take 3 tablets (60 mg total) by mouth daily. 09/07/18   Alford Highland, NP  traZODone (DESYREL) 50 MG tablet Take 50 mg by mouth at bedtime as needed. 08/13/18   [provider]  warfarin (COUMADIN) 2 MG tablet Take 1 tablet (2 mg total) by mouth daily. 09/06/18 09/06/19  Alford Highland, NP    Family History Family History  Problem Relation Age of Onset  . Diabetes Mellitus II Mother   . Throat cancer Brother     Social  History Social History   Tobacco Use  . Smoking status: Never Smoker  . Smokeless tobacco: Never Used  Substance Use Topics  . Alcohol use: Yes    Comment: Occasionally.  . Drug use: Not on file     Allergies   Patient has no known allergies.   Review of Systems Review of Systems  Constitutional: Positive for activity change, appetite change and fatigue. Negative for fever.  HENT: Negative for congestion and rhinorrhea.   Eyes: Negative for visual disturbance.  Respiratory: Positive for shortness of breath. Negative for chest tightness.   Gastrointestinal: Positive for abdominal pain, diarrhea, nausea and vomiting. Negative for blood in stool.  Genitourinary:  Negative for dysuria and hematuria.  Musculoskeletal: Positive for arthralgias, back pain and myalgias.  Skin: Negative for rash.  Neurological: Positive for weakness. Negative for dizziness.    all other systems are negative except as noted in the HPI and PMH.    Physical Exam Updated Vital Signs BP 99/76   Pulse 93   Temp 98.6 F (37 C) (Oral)   Resp 16   Ht 5\' 9"  (1.753 m)   Wt 87.5 kg   SpO2 97%   BMI 28.50 kg/m   Physical Exam  Constitutional: He is oriented to person, place, and time. He appears well-developed and well-nourished. No distress.  Chronically ill-appearing, pale, fatigued appearing  HENT:  Head: Normocephalic and atraumatic.  Mouth/Throat: Oropharynx is clear and moist. No oropharyngeal exudate.  Eyes: Pupils are equal, round, and reactive to light. Conjunctivae and EOM are normal.  Neck: Normal range of motion. Neck supple.  No meningismus.  Cardiovascular: Normal rate, regular rhythm, normal heart sounds and intact distal pulses.  No murmur heard. Pulmonary/Chest: Effort normal. No respiratory distress. He has rales.  Bibasilar crackles  Abdominal: Soft. There is no tenderness. There is no rebound and no guarding.  Extensive bruising to right ribs, right flank  Genitourinary:   Genitourinary Comments: Chaperone present, no gross blood  Musculoskeletal: Normal range of motion. He exhibits no edema or tenderness.  Neurological: He is alert and oriented to person, place, and time. No cranial nerve deficit. He exhibits normal muscle tone. Coordination normal.  No ataxia on finger to nose bilaterally. No pronator drift. 5/5 strength throughout. CN 2-12 intact.Equal grip strength. Sensation intact.   Skin: Skin is warm. Capillary refill takes less than 2 seconds. No rash noted.  Psychiatric: He has a normal mood and affect. His behavior is normal.  Nursing note and vitals reviewed.    ED Treatments / Results  Labs (all labs ordered are listed, but only abnormal results are displayed) Labs Reviewed  BASIC METABOLIC PANEL - Abnormal; Notable for the following components:      Result Value   Sodium 134 (*)    Chloride 95 (*)    Glucose, Bld 199 (*)    BUN 34 (*)    Creatinine, Ser 3.02 (*)    Calcium 8.6 (*)    GFR calc non Af Amer 20 (*)    GFR calc Af Amer 24 (*)    All other components within normal limits  CBC - Abnormal; Notable for the following components:   WBC 12.4 (*)    Hemoglobin 12.8 (*)    All other components within normal limits  PROTIME-INR - Abnormal; Notable for the following components:   Prothrombin Time 26.8 (*)    All other components within normal limits  TROPONIN I - Abnormal; Notable for the following components:   Troponin I 0.43 (*)    All other components within normal limits  BRAIN NATRIURETIC PEPTIDE - Abnormal; Notable for the following components:   B Natriuretic Peptide 1,989.1 (*)    All other components within normal limits  COMPREHENSIVE METABOLIC PANEL - Abnormal; Notable for the following components:   Sodium 134 (*)    Chloride 97 (*)    CO2 21 (*)    Glucose, Bld 195 (*)    BUN 34 (*)    Creatinine, Ser 2.98 (*)    Calcium 8.8 (*)    Total Protein 6.2 (*)    Albumin 3.3 (*)    Total Bilirubin 1.3 (*)  GFR  calc non Af Amer 21 (*)    GFR calc Af Amer 24 (*)    Anion gap 16 (*)    All other components within normal limits  CBC - Abnormal; Notable for the following components:   WBC 13.9 (*)    All other components within normal limits  I-STAT TROPONIN, ED - Abnormal; Notable for the following components:   Troponin i, poc 0.19 (*)    All other components within normal limits  I-STAT CG4 LACTIC ACID, ED - Abnormal; Notable for the following components:   Lactic Acid, Venous 2.36 (*)    All other components within normal limits  POC OCCULT BLOOD, ED  I-STAT CG4 LACTIC ACID, ED  CG4 I-STAT (LACTIC ACID)    EKG EKG Interpretation  Date/Time:  Saturday September 11 2018 22:36:04 EDT Ventricular Rate:  89 PR Interval:  204 QRS Duration: 114 QT Interval:  398 QTC Calculation: 484 R Axis:   172 Text Interpretation:  Normal sinus rhythm Possible Right ventricular hypertrophy Inferior infarct , age undetermined Anterolateral infarct , age undetermined Abnormal ECG lateral ST depressions similar  Confirmed by Glynn Octave [phone]) on 09/24/2018 11:01:04 PM   Radiology Ct Abdomen Pelvis Wo Contrast  Result Date: 09/12/2018 CLINICAL DATA:  Chest tightness. Abd pain, gastroenteritis or colitis suspected EXAM: CT ABDOMEN AND PELVIS WITHOUT CONTRAST TECHNIQUE: Multidetector CT imaging of the abdomen and pelvis was performed following the standard protocol without IV contrast. COMPARISON:  Abdominal CT 08/15/2018 FINDINGS: Lower chest: Small to moderate right and small left pleural effusion with adjacent atelectasis, similar or slightly increased from prior CT. There are coronary artery calcifications. Hepatobiliary: Diffusely decreased hepatic density consistent with steatosis. Punctate hepatic granuloma. High-density gallbladder contents may represent sludge or stones, no pericholecystic inflammation. No biliary dilatation. No ductal dilatation or inflammation. Pancreas: No ductal dilatation or  inflammation. Spleen: Normal in size without focal abnormality. Adrenals/Urinary Tract: Normal adrenal glands. No hydronephrosis or perinephric edema. No urolithiasis. Urinary bladder is partially distended and unremarkable. Stomach/Bowel: Bowel evaluation is limited in the absence of enteric and IV contrast. Mild gastric distention with fluid/ingested contents. No small bowel dilatation to suggest obstruction. No evidence of wall thickening or inflammation. Normal appendix. No evidence of colonic inflammation, descending and sigmoid colon are decompressed. Vascular/Lymphatic: Aorta bi-iliac atherosclerosis, mild. Small upper abdominal or retroperitoneal nodes not enlarged by size criteria. Reproductive: Prostate is unremarkable. Other: Fat in both inguinal canals. No abdominal ascites. No free air. Musculoskeletal: There are no acute or suspicious osseous abnormalities. Degenerative change in the spine. IMPRESSION: 1. Fluid within mildly distended stomach, may be related to p.o. intake. No evidence of gastric inflammation or wall thickening to suggest gastritis. 2. Mild hepatic steatosis. High-density gallbladder contents may be sludge or stones. No gallbladder inflammation. 3. Small to moderate right and small left pleural effusion in the included lung bases, similar to slightly increased from CT 1 month ago. 4.  Aortic Atherosclerosis (ICD10-I70.0). Electronically Signed   By: Narda Rutherford M.D.   On: 09/12/2018 00:21   Dg Chest 2 View  Result Date: 09/21/2018 CLINICAL DATA:  LEFT rib pain after CPR 1 week ago. EXAM: CHEST - 2 VIEW COMPARISON:  Chest radiograph September 04, 2018 FINDINGS: Cardiac silhouette is mildly enlarged unchanged. Similar interstitial prominence with small bilateral pleural effusions, improved on the RIGHT. Persistent though improved bibasilar airspace opacities. No pneumothorax. Single lead LEFT AICD. Osseous structures are non suspicious. IMPRESSION: 1. Small residual pleural  effusion, improved on the RIGHT. Residual  bibasilar airspace opacities. 2. Mild cardiomegaly. 3. Mild chronic interstitial changes. Electronically Signed   By: Awilda Metroourtnay  Bloomer M.D.   On: Jul 10, 2018 22:54    Procedures Procedures (including critical care time)  Medications Ordered in ED Medications - No data to display   Initial Impression / Assessment and Plan / ED Course  I have reviewed the triage vital signs and the nursing notes.  Pertinent labs & imaging results that were available during my care of the patient were reviewed by me and considered in my medical decision making (see chart for details).    Patient recently admitted to the hospital for NSTEMI complicated by cardiac arrest, triple-vessel disease, ischemic cardiomyopathy and apical thrombus  Now with shortness of breath, right upper back pain and diarrhea and abdominal distention.  Patient states his leg swelling has improved since his discharge and his weight is been stable.  CReatinine worse at 3.0 was 2.5 on discharge.  Troponin elevated 0.4 which is likely still downtrending.  No chest pain.     Case discussed with Dr. Noemi ChapelFalk-Martin of cardiology who feels this is more of a medical issue with patient's abdominal pain and diarrhea and vomiting with history of ischemic cardiomyopathy and diuretic use.  She will consult on patient and request medicine admission. Suggests gentle fluids  Labs show stable hemoglobin, lactic acidosis and AKI.  Troponin elevated as previously.  EKG is unchanged.  Chest x-ray shows pleural effusions and edema improving. CT shows fluid in stomach and gallstones without gall bladder wall thickening.  Suspect patient's abdominal discomfort may be due to metformin use versus amiodarone use.  He has been seen by cardiology who recommends medical admission.  Will hydrate gently.  Hold metformin. Admission discussed with Dr. Selena BattenKim.     Final Clinical Impressions(s) / ED Diagnoses   Final  diagnoses:  Acute renal failure, unspecified acute renal failure type (HCC)  Diarrhea, unspecified type  Lactic acidosis    ED Discharge Orders    None       Glynn Octaveancour, Shaely Gadberry, MD 09/12/18 0710

## 2018-09-11 NOTE — Telephone Encounter (Signed)
Paged by answering service.  Long admission recently, on CHF team.  Reviewed data. Patient had Bilateral Pleural Effusions s/p right thoracentesis 9/6, left thoracentesis 9/7.   Patient states that he had a hard time breathing at night when laying down.  Feels better with multiple pillows.  No dyspnea at rest or with ambulation.  Asking for oxygen which helped during admission.  Unfortunately, not able to provide oxygen over the weekend.  Questionable recurrent pleural effusion.  Per patient he is losing weight.  Advised to split torsemide.  Take 40 mg in the morning and 20 mg at p.m. if worsening breathing call EMS or go to ER.  He is agree with plan.  He will call CHF clinic Monday morning for further instruction.

## 2018-09-11 NOTE — ED Notes (Signed)
NF Callie and Dr Patria Maneampos informed of troponin results .19

## 2018-09-12 ENCOUNTER — Encounter (HOSPITAL_COMMUNITY): Payer: Self-pay | Admitting: Internal Medicine

## 2018-09-12 ENCOUNTER — Observation Stay (HOSPITAL_BASED_OUTPATIENT_CLINIC_OR_DEPARTMENT_OTHER): Payer: BLUE CROSS/BLUE SHIELD

## 2018-09-12 DIAGNOSIS — R748 Abnormal levels of other serum enzymes: Secondary | ICD-10-CM | POA: Diagnosis not present

## 2018-09-12 DIAGNOSIS — R112 Nausea with vomiting, unspecified: Secondary | ICD-10-CM | POA: Diagnosis not present

## 2018-09-12 DIAGNOSIS — R197 Diarrhea, unspecified: Secondary | ICD-10-CM | POA: Diagnosis not present

## 2018-09-12 DIAGNOSIS — F4310 Post-traumatic stress disorder, unspecified: Secondary | ICD-10-CM

## 2018-09-12 DIAGNOSIS — R109 Unspecified abdominal pain: Secondary | ICD-10-CM

## 2018-09-12 DIAGNOSIS — N179 Acute kidney failure, unspecified: Secondary | ICD-10-CM | POA: Diagnosis not present

## 2018-09-12 DIAGNOSIS — N183 Chronic kidney disease, stage 3 (moderate): Secondary | ICD-10-CM | POA: Diagnosis not present

## 2018-09-12 DIAGNOSIS — F4322 Adjustment disorder with anxiety: Secondary | ICD-10-CM | POA: Diagnosis not present

## 2018-09-12 DIAGNOSIS — I5022 Chronic systolic (congestive) heart failure: Secondary | ICD-10-CM

## 2018-09-12 DIAGNOSIS — I340 Nonrheumatic mitral (valve) insufficiency: Secondary | ICD-10-CM | POA: Diagnosis not present

## 2018-09-12 DIAGNOSIS — F4312 Post-traumatic stress disorder, chronic: Secondary | ICD-10-CM

## 2018-09-12 DIAGNOSIS — G4700 Insomnia, unspecified: Secondary | ICD-10-CM

## 2018-09-12 DIAGNOSIS — E1121 Type 2 diabetes mellitus with diabetic nephropathy: Secondary | ICD-10-CM

## 2018-09-12 LAB — CBC
HEMATOCRIT: 42.1 % (ref 39.0–52.0)
Hemoglobin: 13.1 g/dL (ref 13.0–17.0)
MCH: 27.9 pg (ref 26.0–34.0)
MCHC: 31.1 g/dL (ref 30.0–36.0)
MCV: 89.6 fL (ref 78.0–100.0)
PLATELETS: 239 10*3/uL (ref 150–400)
RBC: 4.7 MIL/uL (ref 4.22–5.81)
RDW: 14.6 % (ref 11.5–15.5)
WBC: 13.9 10*3/uL — AB (ref 4.0–10.5)

## 2018-09-12 LAB — COMPREHENSIVE METABOLIC PANEL
ALT: 23 U/L (ref 0–44)
ANION GAP: 16 — AB (ref 5–15)
AST: 35 U/L (ref 15–41)
Albumin: 3.3 g/dL — ABNORMAL LOW (ref 3.5–5.0)
Alkaline Phosphatase: 65 U/L (ref 38–126)
BILIRUBIN TOTAL: 1.3 mg/dL — AB (ref 0.3–1.2)
BUN: 34 mg/dL — ABNORMAL HIGH (ref 8–23)
CO2: 21 mmol/L — ABNORMAL LOW (ref 22–32)
Calcium: 8.8 mg/dL — ABNORMAL LOW (ref 8.9–10.3)
Chloride: 97 mmol/L — ABNORMAL LOW (ref 98–111)
Creatinine, Ser: 2.98 mg/dL — ABNORMAL HIGH (ref 0.61–1.24)
GFR, EST AFRICAN AMERICAN: 24 mL/min — AB (ref >60)
GFR, EST NON AFRICAN AMERICAN: 21 mL/min — AB (ref >60)
Glucose, Bld: 195 mg/dL — ABNORMAL HIGH (ref 70–99)
Potassium: 4.5 mmol/L (ref 3.5–5.1)
Sodium: 134 mmol/L — ABNORMAL LOW (ref 135–145)
TOTAL PROTEIN: 6.2 g/dL — AB (ref 6.5–8.1)

## 2018-09-12 LAB — GLUCOSE, CAPILLARY
GLUCOSE-CAPILLARY: 212 mg/dL — AB (ref 70–99)
GLUCOSE-CAPILLARY: 172 mg/dL — AB (ref 70–99)
Glucose-Capillary: 226 mg/dL — ABNORMAL HIGH (ref 70–99)
Glucose-Capillary: 171 mg/dL — ABNORMAL HIGH (ref 70–99)

## 2018-09-12 LAB — I-STAT CG4 LACTIC ACID, ED: LACTIC ACID, VENOUS: 2.36 mmol/L — AB (ref 0.5–1.9)

## 2018-09-12 LAB — BRAIN NATRIURETIC PEPTIDE: B NATRIURETIC PEPTIDE 5: 1989.1 pg/mL — AB (ref 0.0–100.0)

## 2018-09-12 LAB — CG4 I-STAT (LACTIC ACID): Lactic Acid, Venous: 1.69 mmol/L (ref 0.5–1.9)

## 2018-09-12 MED ORDER — WARFARIN - PHARMACIST DOSING INPATIENT
Freq: Every day | Status: DC
  Administered 2018-09-14: 18:00:00

## 2018-09-12 MED ORDER — ACETAMINOPHEN 650 MG RE SUPP: 650.0000 mg | Freq: Four times a day (QID) | RECTAL | Status: DC | PRN

## 2018-09-12 MED ORDER — ADULT MULTIVITAMIN W/MINERALS CH
1.0000 | ORAL_TABLET | Freq: Every day | ORAL | Status: DC
  Administered 2018-09-12 – 2018-09-18 (×6): 1 via ORAL
  Filled 2018-09-12 (×7): qty 1

## 2018-09-12 MED ORDER — ACETAMINOPHEN 325 MG PO TABS
650.0000 mg | ORAL_TABLET | Freq: Four times a day (QID) | ORAL | Status: DC | PRN
  Administered 2018-09-12 – 2018-09-18 (×2): 650 mg via ORAL
  Filled 2018-09-12 (×2): qty 2

## 2018-09-12 MED ORDER — WARFARIN SODIUM 2 MG PO TABS
2.0000 mg | ORAL_TABLET | Freq: Every day | ORAL | Status: DC
  Administered 2018-09-12: 2 mg via ORAL
  Filled 2018-09-12: qty 1

## 2018-09-12 MED ORDER — SERTRALINE HCL 50 MG PO TABS
25.0000 mg | ORAL_TABLET | Freq: Every day | ORAL | Status: DC
  Administered 2018-09-12: 25 mg via ORAL
  Filled 2018-09-12: qty 1

## 2018-09-12 MED ORDER — TRAMADOL HCL 50 MG PO TABS
50.0000 mg | ORAL_TABLET | Freq: Four times a day (QID) | ORAL | Status: AC | PRN
  Administered 2018-09-12 (×2): 50 mg via ORAL
  Filled 2018-09-12 (×2): qty 1

## 2018-09-12 MED ORDER — LORAZEPAM 0.5 MG PO TABS
0.5000 mg | ORAL_TABLET | Freq: Four times a day (QID) | ORAL | Status: DC | PRN
  Administered 2018-09-12: 0.5 mg via ORAL
  Administered 2018-09-13 – 2018-09-18 (×5): 1 mg via ORAL
  Administered 2018-09-18: 0.5 mg via ORAL
  Administered 2018-09-18: 1 mg via ORAL
  Filled 2018-09-12: qty 1
  Filled 2018-09-12 (×7): qty 2

## 2018-09-12 MED ORDER — ASPIRIN EC 81 MG PO TBEC
81.0000 mg | DELAYED_RELEASE_TABLET | Freq: Every day | ORAL | Status: DC
  Administered 2018-09-12 – 2018-09-18 (×6): 81 mg via ORAL
  Filled 2018-09-12 (×7): qty 1

## 2018-09-12 MED ORDER — CARVEDILOL 3.125 MG PO TABS
3.1250 mg | ORAL_TABLET | Freq: Two times a day (BID) | ORAL | Status: DC
  Administered 2018-09-12 – 2018-09-14 (×6): 3.125 mg via ORAL
  Filled 2018-09-12 (×7): qty 1

## 2018-09-12 MED ORDER — SODIUM CHLORIDE 0.9 % IV BOLUS
500.0000 mL | Freq: Once | INTRAVENOUS | Status: AC
  Administered 2018-09-12: 500 mL via INTRAVENOUS

## 2018-09-12 MED ORDER — INSULIN ASPART 100 UNIT/ML ~~LOC~~ SOLN
0.0000 [IU] | Freq: Three times a day (TID) | SUBCUTANEOUS | Status: DC
  Administered 2018-09-12: 3 [IU] via SUBCUTANEOUS
  Administered 2018-09-12: 2 [IU] via SUBCUTANEOUS
  Administered 2018-09-13 (×2): 3 [IU] via SUBCUTANEOUS
  Administered 2018-09-13: 1 [IU] via SUBCUTANEOUS
  Administered 2018-09-14: 7 [IU] via SUBCUTANEOUS
  Administered 2018-09-14 (×2): 2 [IU] via SUBCUTANEOUS
  Administered 2018-09-15: 1 [IU] via SUBCUTANEOUS
  Administered 2018-09-15: 5 [IU] via SUBCUTANEOUS
  Administered 2018-09-15: 1 [IU] via SUBCUTANEOUS
  Administered 2018-09-16: 5 [IU] via SUBCUTANEOUS
  Administered 2018-09-17 – 2018-09-18 (×3): 3 [IU] via SUBCUTANEOUS
  Administered 2018-09-18: 1 [IU] via SUBCUTANEOUS

## 2018-09-12 MED ORDER — TRAMADOL HCL 50 MG PO TABS
50.0000 mg | ORAL_TABLET | Freq: Two times a day (BID) | ORAL | Status: DC | PRN
  Administered 2018-09-12 – 2018-09-13 (×2): 50 mg via ORAL
  Filled 2018-09-12 (×2): qty 1

## 2018-09-12 MED ORDER — LORAZEPAM 0.5 MG PO TABS
0.5000 mg | ORAL_TABLET | Freq: Every day | ORAL | Status: DC | PRN
  Administered 2018-09-12: 1 mg via ORAL
  Filled 2018-09-12: qty 1
  Filled 2018-09-12: qty 2

## 2018-09-12 MED ORDER — ENOXAPARIN SODIUM 30 MG/0.3ML ~~LOC~~ SOLN: 30.0000 mg | Freq: Every day | SUBCUTANEOUS | Status: DC

## 2018-09-12 MED ORDER — SERTRALINE HCL 50 MG PO TABS
50.0000 mg | ORAL_TABLET | Freq: Every day | ORAL | Status: DC
  Administered 2018-09-13 – 2018-09-18 (×6): 50 mg via ORAL
  Filled 2018-09-12 (×6): qty 1

## 2018-09-12 MED ORDER — GLIMEPIRIDE 4 MG PO TABS
4.0000 mg | ORAL_TABLET | Freq: Two times a day (BID) | ORAL | Status: DC
  Administered 2018-09-12 – 2018-09-18 (×11): 4 mg via ORAL
  Filled 2018-09-12 (×17): qty 1

## 2018-09-12 MED ORDER — TRAMADOL HCL 50 MG PO TABS: 50.0000 mg | ORAL_TABLET | Freq: Four times a day (QID) | ORAL | Status: DC | PRN

## 2018-09-12 MED ORDER — ONDANSETRON HCL 4 MG/2ML IJ SOLN
4.0000 mg | Freq: Four times a day (QID) | INTRAMUSCULAR | Status: DC | PRN
  Administered 2018-09-12 – 2018-09-16 (×3): 4 mg via INTRAVENOUS
  Filled 2018-09-12 (×3): qty 2

## 2018-09-12 MED ORDER — SODIUM CHLORIDE 0.9 % IV SOLN
INTRAVENOUS | Status: DC
  Administered 2018-09-12: 04:00:00 via INTRAVENOUS

## 2018-09-12 MED ORDER — ATORVASTATIN CALCIUM 40 MG PO TABS
40.0000 mg | ORAL_TABLET | Freq: Every day | ORAL | Status: DC
  Administered 2018-09-12 – 2018-09-18 (×7): 40 mg via ORAL
  Filled 2018-09-12 (×5): qty 1
  Filled 2018-09-12: qty 2
  Filled 2018-09-12: qty 1

## 2018-09-12 MED ORDER — TRAZODONE HCL 50 MG PO TABS
50.0000 mg | ORAL_TABLET | Freq: Every evening | ORAL | Status: DC | PRN
  Administered 2018-09-12 – 2018-09-18 (×6): 50 mg via ORAL
  Filled 2018-09-12 (×6): qty 1

## 2018-09-12 MED ORDER — INSULIN ASPART 100 UNIT/ML ~~LOC~~ SOLN
0.0000 [IU] | Freq: Every day | SUBCUTANEOUS | Status: DC
  Administered 2018-09-14: 2 [IU] via SUBCUTANEOUS

## 2018-09-12 NOTE — Progress Notes (Signed)
Brief cardiology follow up note  Patient is well known to me from prior admission. He was seen early this AM by Dr. Daphine DeutscherMartin after presenting with nausea, vomiting, and diarrhea. He stopped metformin prior to presentation after he read the package insert re: side effects.  He is more comfortable now, still bloated and gassy but less nausea, no recent vomiting.  I was concerned initially to hear about his symptoms, given that his prior presentation appeared to be abdominal distress but was most likely a missed MI. His symptoms today are different than those, and they have improved with cessation of metformin. Given his renal function, would not place back on metformin. He appears a bit dry, and his renal function is up slightly. Would hold his diuretics, encourage PO intake as tolerated. Continue carvedilol. Holding amiodarone given nausea. Troponin is downtrending from prior discharge. Continue coumadin for LV thrombus. SGLT2 inhibitor would be option if kidney function improves/stabilizes, but would hold on starting for now.  We will continue to follow  Jodelle RedBridgette Yazmyn Valbuena, MD, PhD [facility]  [facility]  [address], [address] [place], [place] [address] [phone]

## 2018-09-12 NOTE — Progress Notes (Signed)
[facility] A&0 x4. No complaints of pain. VSS. SR on Tele. Patient has steri strips from ICD placement and bruising to right flank. Patient is a low fall risk. Patient oriented to room and call system.

## 2018-09-12 NOTE — Progress Notes (Addendum)
Patient reports increasing anxiety during report.

## 2018-09-12 NOTE — H&P (Signed)
History and Physical   Patient ID: Jose Myers, MRN: [id number], DOB: 08-Aug-1956   Date of Encounter: 09/12/2018, 12:03 AM  Primary Care Provider: Daisy Floro, MD Cardiologist: Bensimhon  Chief Complaint:  Abdominal pain, N/V  History of Present Illness: Jose Myers is a 62 y.o. 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  Pt was admitted 08/15/18 with abdominal pain. Noted to have elevated troponin and EKG changes, volume overload, and AKI. Ruled in for NSTEMI and taken for cath pending renal function improvement and diuresis. Cath 08/19/18 showed severe multivessel CAD, including LM disease, CTS evaluation was done and felt to have poor target vessels and complete revascularization not possible. TCTS saw andfound to be too high risk for CABG, with preference to consider PCI to CABG, noting that Impella would not be an option given LV thrombus.  On 08/20/18, pt suffered a VT arrest requiring CPR, 2 shocks, 2 rounds of Epi, and intubation. Post arrest Echo 08/20/18 noted that apical thrombus was goneand EF of 25-30%. Pt extubated 08/21/18  On 08/22/18, Code stroke was called due to acute AMS. Neuro thought this wasmost c/w delirium, though noted possible subacute cerebellar stroke by imaging. Normal brain MRI. Nephrology consulted for worsening renal function, but felt to be improving enough with IV lasix not to need HD. Renal US negative.  Interventional cardiology reviewed films at length 08/24/18 and felt not to be candidate for PCI.Pt underwentR thoracentesis with 1.4L out 08/27/18. EP asked to see for ICD consideration pending clinical course. Pt stabilized and underwent ICD placement 08/31/18.  HF team consulted 09/02/18 due to continued volume overload.Diuresed onIV lasix. Underwent repeat Thoracentesis 9/6 and 09/04/18 with 1.7L and 800 cc off, respectively. (Transudative by  Light's criteria). Pt continued coumadin for LV thrombus and ? CVA.  He saw Dr. Gala Romney in clinic 09-09-18 at which time he was c/o feeling "gassy" with nausea and vomiting. It sounds like the etiology was unclear. The N/V and abdominal pain have progressively worsened since 09-09-18. Pt says he has been vomiting or dry-heaving consistently over the past 2-3 days now. He has tried to keep himself hydrated, but whenever he drinks fluids they seem to come back up, including water. He started also having diarrhea today as well. He has continued to take torsemide as directed. He c/o severe back pain related to his recent CPR; he says the back pain makes it uncomfortable for him to lie flat or get any sleep. He does not c/o orthopnea or PND. He says he has not had any worsening SOB or DOE over the past few days. His weight has remained stable at 193lb since 09-09-18.  He believes the GI sx are due to metformin after reading the package insert, and held his evening dose of metformin tonight.    Past Medical History:  Diagnosis Date  . Coronary artery disease   . Diabetes mellitus without complication (HCC)   . Hypertension     Past Surgical History:  Procedure Laterality Date  . CARDIAC CATHETERIZATION    . CORONARY ANGIOGRAPHY N/A 08/19/2018   Procedure: CORONARY ANGIOGRAPHY (CATH LAB);  Surgeon: Lyn Records, MD;  Location: [facility] INVASIVE CV LAB;  Service: Cardiovascular;  Laterality: N/A;  . EYE SURGERY    . ICD IMPLANT N/A 08/31/2018   Procedure: ICD IMPLANT;  Surgeon: Marinus Maw, MD;  Location: [facility] INVASIVE CV LAB;  Service: Cardiovascular;  Laterality: N/A;  .  IR THORACENTESIS ASP PLEURAL SPACE W/IMG GUIDE  08/27/2018  . IR THORACENTESIS ASP PLEURAL SPACE W/IMG GUIDE  09/03/2018  . PERCUTANEOUS CORONARY STENT INTERVENTION (PCI-S)  04/11/2003   LAD  . RIGHT HEART CATH N/A 08/19/2018   Procedure: RIGHT HEART CATH;  Surgeon: Lyn Records, MD;  Location: [facility] INVASIVE CV LAB;  Service:  Cardiovascular;  Laterality: N/A;     Prior to Admission medications   Medication Sig Start Date End Date Taking? Authorizing Provider  Apple Cid Vn-Grn Tea-Bit Or-Cr (APPLE CIDER VINEGAR PLUS) TABS Take 1 tablet by mouth daily.    [provider]  aspirin 81 MG EC tablet Take 1 tablet (81 mg total) by mouth daily. 09/07/18   Alford Highland, NP  atorvastatin (LIPITOR) 40 MG tablet Take 1 tablet (40 mg total) by mouth daily at 6 PM. 09/06/18   Alford Highland, NP  carvedilol (COREG) 6.25 MG tablet Take 0.5 tablets (3.125 mg total) by mouth 2 (two) times daily with a meal. 09/06/18   Alford Highland, NP  docusate sodium (COLACE) 100 MG capsule Take 1 capsule (100 mg total) by mouth 2 (two) times daily as needed for mild constipation. 09/06/18   Alford Highland, NP  feeding supplement, GLUCERNA SHAKE, (GLUCERNA SHAKE) LIQD Take 237 mLs by mouth 2 (two) times daily between meals. 09/06/18   Alford Highland, NP  glimepiride (AMARYL) 4 MG tablet Take 4 mg by mouth 2 (two) times daily.    [provider]  LORazepam (ATIVAN) 1 MG tablet Take 0.5-1 mg by mouth daily as needed for anxiety.    [provider]  metFORMIN (GLUCOPHAGE) 500 MG tablet Take 500 mg by mouth 3 (three) times daily.     [provider]  Multiple Vitamin (MULTIVITAMIN WITH MINERALS) TABS tablet Take 1 tablet by mouth daily. 09/07/18   Alford Highland, NP  Omega 3 340 MG CPDR Take 1 capsule by mouth at bedtime.    [provider]  ondansetron (ZOFRAN-ODT) 4 MG disintegrating tablet Take 1 tablet (4 mg total) by mouth every 8 (eight) hours as needed for nausea or vomiting. 09/09/18   Bensimhon, Bevelyn Buckles, MD  polyethylene glycol (MIRALAX / GLYCOLAX) packet Take 17 g by mouth daily as needed for moderate constipation. 09/06/18   Alford Highland, NP  potassium chloride SA (K-DUR,KLOR-CON) 20 MEQ tablet Take 3 tablets (60 mEq total) by mouth daily. 09/09/18   Bensimhon, Bevelyn Buckles, MD    Prenat-FePoly-Fered-FA-Omega 3 (DUET DHA 430 PO) Take 1 capsule by mouth daily.    [provider]  sertraline (ZOLOFT) 50 MG tablet Take 25 mg by mouth daily. 07/13/18   [provider]  torsemide (DEMADEX) 20 MG tablet Take 3 tablets (60 mg total) by mouth daily. 09/07/18   Alford Highland, NP  traZODone (DESYREL) 50 MG tablet Take 50 mg by mouth at bedtime as needed. 08/13/18   [provider]  warfarin (COUMADIN) 2 MG tablet Take 1 tablet (2 mg total) by mouth daily. 09/06/18 09/06/19  Alford Highland, NP     Allergies: No Known Allergies  Social History:  The patient  reports that he has never smoked. He has never used smokeless tobacco. He reports that he drinks alcohol.   Family History:  The patient's family history includes Diabetes Mellitus II in his mother; Throat cancer in his brother.   ROS:  Please see the history of present illness.     All other systems  reviewed and negative.   Vital Signs: Blood pressure 99/76, pulse 95, temperature 98.6 F (37 C), temperature source Oral, resp. rate (!) 26, height 5\' 9"  (1.753 m), weight 87.5 kg, SpO2 97 %.  PHYSICAL EXAM: General:  Well nourished, well developed, in no acute distress HEENT: normal Lymph: no adenopathy Neck: neck veins flat Endocrine:  No thryomegaly Vascular: No carotid bruits; DP pulses 2+ bilaterally   Cardiac:  normal S1, S2; RRR; no murmur  Lungs:  clear to auscultation bilaterally, no wheezing, rhonchi or rales  Abd: soft, nontender, slightly tender to palpation. Decreased bowel sounds. no hepatomegaly  Ext: tr-1+ bilateral edema Musculoskeletal:  No deformities, BUE and BLE strength normal and equal Skin: warm and dry  Neuro:  CNs 2-12 intact, no focal abnormalities noted Psych:  Normal affect   EKG:  NSR, inferior and anterior infarcts, similar to prior  Labs:   Lab Results  Component Value Date   WBC 12.4 (H) 09/02/2018   HGB 12.8 (L) 09/10/2018   HCT 39.5 09/21/2018   MCV  86.8 08/29/2018   PLT 224 09/25/2018   Recent Labs  Lab 09/09/18 1433 08/31/2018 2225  NA 137 134*  K 3.1* 4.4  CL 98 95*  CO2 27 26  BUN 23 34*  CREATININE 2.27* 3.02*  CALCIUM 8.9 8.6*  PROT 6.2*  --   BILITOT 1.2  --   ALKPHOS 66  --   ALT 17  --   AST 26  --   GLUCOSE 151* 199*   Recent Labs    09/01/2018 2318  TROPONINI 0.43*   Troponin (Point of Care Test) Recent Labs    09/17/2018 2230  TROPIPOC 0.19*    Lab Results  Component Value Date   CHOL 111 08/22/2018   HDL 32 (L) 08/22/2018   LDLCALC 59 08/22/2018   TRIG 98 08/22/2018   No results found for: DDIMER  Radiology/Studies:  Ct Abdomen Pelvis Wo Contrast  Result Date: 08/15/2018 CLINICAL DATA:  Abdominal pain and increasing distension 1 episode of vomiting EXAM: CT ABDOMEN AND PELVIS WITHOUT CONTRAST TECHNIQUE: Multidetector CT imaging of the abdomen and pelvis was performed following the standard protocol without IV contrast. COMPARISON:  None. FINDINGS: Lower chest: Small moderate right pleural effusion. Small left pleural effusion. Partial atelectasis versus pneumonia in the right greater than left lung bases. Coronary vascular calcification. Heart size upper normal. No large pericardial effusion. Hepatobiliary: No focal liver abnormality is seen. No gallstones, gallbladder wall thickening, or biliary dilatation. Pancreas: Unremarkable. No pancreatic ductal dilatation or surrounding inflammatory changes. Spleen: Normal in size without focal abnormality. Adrenals/Urinary Tract: Adrenal glands are unremarkable. Kidneys are normal, without renal calculi, focal lesion, or hydronephrosis. Bladder is unremarkable. Stomach/Bowel: Stomach is within normal limits. Appendix appears normal. No evidence of bowel wall thickening, distention, or inflammatory changes. Vascular/Lymphatic: Mild aortic atherosclerosis. No aneurysm. No significantly enlarged lymph nodes Reproductive: Slightly enlarged prostate Other: Fat within the  inguinal canals. No free air. No significant ascites. Musculoskeletal: Degenerative changes. No acute or suspicious abnormality. IMPRESSION: 1. No CT evidence for acute intra-abdominal or pelvic abnormality. 2. Small to moderate right pleural effusion and small left pleural effusion. Partial atelectasis versus pneumonia in the bilateral lung bases. Electronically Signed   By: Jasmine Pang M.D.   On: 08/15/2018 19:22   Dg Chest 1 View  Result Date: 09/04/2018 CLINICAL DATA:  Status post left-sided thoracentesis. EXAM: CHEST  1 VIEW COMPARISON:  September 03, 2018 and September 04, 2018 FINDINGS: The left effusion  persists but is smaller in the interval. A right basilar infiltrate with probable associated effusion remains. No pneumothorax. No other changes. Stable AICD device. IMPRESSION: 1. Persistent infiltrate and probable associated effusion in the right base. Recommend follow-up to resolution. 2. Improved but persistent effusion on the left after thoracentesis. No pneumothorax. Electronically Signed   By: Gerome Sam III M.D   On: 09/04/2018 14:24   Dg Chest 1 View  Result Date: 09/03/2018 CLINICAL DATA:  Status post right thoracentesis. EXAM: CHEST  1 VIEW COMPARISON:  Earlier today. FINDINGS: The cardiac silhouette remains borderline enlarged. Increased left basilar pleural fluid and linear density with less patchy density. Small right pleural effusion, significantly decreased. Decreased associated adjacent right basilar lung opacity. No pneumothorax. Stable left subclavian AICD lead. Thoracic spine degenerative changes. IMPRESSION: 1. Decreased right basilar atelectasis and pleural fluid following thoracentesis. 2. No pneumothorax. 3. Moderate-sized left pleural effusion, increased. 4. Overall decrease in left basilar atelectasis. Electronically Signed   By: Beckie Salts M.D.   On: 09/03/2018 16:10   Dg Chest 1 View  Result Date: 08/27/2018 CLINICAL DATA:  Status post right-sided thoracentesis.  EXAM: CHEST  1 VIEW COMPARISON:  Chest radiograph 08/25/2018 FINDINGS: Decreased size of right pleural effusion following thoracentesis. No pneumothorax. Improved aeration of the right lung base. Small left pleural effusion with associated atelectasis is unchanged. Left IJ approach central venous catheter is no longer present. IMPRESSION: Decreased size of right pleural effusion following thoracentesis. No pneumothorax. Electronically Signed   By: Deatra Robinson M.D.   On: 08/27/2018 16:20   Dg Chest 2 View  Result Date: Sep 12, 2018 CLINICAL DATA:  LEFT rib pain after CPR 1 week ago. EXAM: CHEST - 2 VIEW COMPARISON:  Chest radiograph September 04, 2018 FINDINGS: Cardiac silhouette is mildly enlarged unchanged. Similar interstitial prominence with small bilateral pleural effusions, improved on the RIGHT. Persistent though improved bibasilar airspace opacities. No pneumothorax. Single lead LEFT AICD. Osseous structures are non suspicious. IMPRESSION: 1. Small residual pleural effusion, improved on the RIGHT. Residual bibasilar airspace opacities. 2. Mild cardiomegaly. 3. Mild chronic interstitial changes. Electronically Signed   By: Awilda Metro M.D.   On: 12-Sep-2018 22:54   Dg Chest 2 View  Result Date: 09/04/2018 CLINICAL DATA:  Right thoracentesis. EXAM: CHEST - 2 VIEW COMPARISON:  September 03, 2018 FINDINGS: Right basilar opacity remains with a probable associated effusion. No pneumothorax. A left effusion and underlying opacity is stable. No other changes. IMPRESSION: 1. The right effusion appears smaller in the interval. There is infiltrate in the right base which could represent atelectasis or pneumonia. Recommend clinical correlation and follow-up to resolution. 2. No pneumothorax after right thoracentesis. 3. Small left effusion with underlying opacity is stable. Electronically Signed   By: Gerome Sam III M.D   On: 09/04/2018 14:25   Dg Chest 2 View  Result Date: 09/01/2018 CLINICAL DATA:   AICD placement yesterday. History of congestive heart failure. EXAM: CHEST - 2 VIEW COMPARISON:  08/27/2018 FINDINGS: Single lead AICD projects over the cardiac shadow. Suspected mild enlargement of the cardiopericardial silhouette. Continued obscuration of the hemidiaphragms and partial obscuration of the cardiac borders due to bilateral pleural effusions with associated atelectasis. Overall the right pleural effusion appears slightly larger than previous. No pneumothorax. Thoracic spondylosis is present. Low lung volumes. IMPRESSION: 1. Mild enlargement of the cardiopericardial silhouette with bilateral pleural effusions and associated atelectasis. 2. Interval placement of an AICD, without complicating feature. 3. Low lung volumes. Electronically Signed   By: Zollie Beckers  Ova Freshwater M.D.   On: 09/01/2018 08:22   Dg Chest 2 View  Result Date: 08/25/2018 CLINICAL DATA:  Right chest pain EXAM: CHEST - 2 VIEW COMPARISON:  08/22/2018 FINDINGS: Left central line is unchanged. Cardiomegaly. Moderate bilateral layering effusions and bilateral lower lobe opacities, atelectasis versus infiltrates. No acute bony abnormality. Slight improvement in aeration since prior study. IMPRESSION: Continued moderate layering effusions and bibasilar atelectasis or infiltrates with slight improvement. Electronically Signed   By: Charlett Nose M.D.   On: 08/25/2018 19:40   Dg Chest 2 View  Result Date: 08/15/2018 CLINICAL DATA:  Orthopnea.  Clinical concern for CHF. EXAM: CHEST - 2 VIEW COMPARISON:  None. FINDINGS: The cardiac silhouette is enlarged. Mixed pattern pulmonary edema with bilateral moderate in size pleural effusions. Osseous structures are without acute abnormality. Soft tissues are grossly normal. IMPRESSION: Enlarged cardiac silhouette. Mixed pattern pulmonary edema with bilateral moderate in size pleural effusions. Electronically Signed   By: Ted Mcalpine M.D.   On: 08/15/2018 17:50   Dg Abd 1 View  Result  Date: 09/09/2018 CLINICAL DATA:  Diarrhea.  Nausea, vomiting. EXAM: ABDOMEN - 1 VIEW COMPARISON:  Radiograph of August 20, 2018. FINDINGS: The bowel gas pattern is normal. Phleboliths are noted in the pelvis. IMPRESSION: No evidence of bowel obstruction or ileus. Electronically Signed   By: Lupita Raider, M.D.   On: 09/09/2018 14:58   US Renal  Result Date: 09/03/2018 CLINICAL DATA:  Proteinuria EXAM: RENAL / URINARY TRACT ULTRASOUND COMPLETE COMPARISON:  CT 08/15/2018, ultrasound 06/21/2017 FINDINGS: Right Kidney: Length: 12.6 cm. Echogenicity within normal limits. No mass or hydronephrosis visualized. Left Kidney: Length: 12.9 cm. Echogenicity within normal limits. No mass or hydronephrosis visualized. Bladder: Appears normal for degree of bladder distention. Incidentally noted are small pleural effusions. IMPRESSION: 1. Negative renal ultrasound 2. Incidentally noted are bilateral pleural effusion Electronically Signed   By: Jasmine Pang M.D.   On: 09/03/2018 00:05   Dg Chest Port 1 View  Result Date: 09/03/2018 CLINICAL DATA:  Congestive heart failure.  AICD. EXAM: PORTABLE CHEST 1 VIEW COMPARISON:  09/01/2018 and prior exams FINDINGS: Cardiomegaly and LEFT AICD again noted. Bilateral pleural effusions, RIGHT greater than LEFT, and bilateral LOWER lung opacities/atelectasis again noted. RIGHT basilar opacity/atelectasis has slightly increased. There is no evidence of pneumothorax. IMPRESSION: Bilateral pleural effusions, RIGHT greater than LEFT, again noted with bilateral LOWER lung opacities/atelectasis, slightly increased on the RIGHT. Electronically Signed   By: Harmon Pier M.D.   On: 09/03/2018 11:09   Dg Chest Port 1 View  Result Date: 08/22/2018 CLINICAL DATA:  Acute respiratory failure. EXAM: PORTABLE CHEST 1 VIEW COMPARISON:  Radiograph of August 21, 2018. FINDINGS: Stable cardiomegaly and central pulmonary vascular congestion is noted. Endotracheal nasogastric tubes have been removed. Left  internal jugular catheter is noted with tip in expected position of the SVC. Mildly increased bilateral perihilar and basilar interstitial densities are noted concerning for pulmonary edema and associated pleural effusions. No pneumothorax is noted. Bony thorax is unremarkable. IMPRESSION: Mildly increased bilateral perihilar and basilar interstitial densities are noted concerning for pulmonary edema and associated pleural effusions. Electronically Signed   By: Lupita Raider, M.D.   On: 08/22/2018 12:15   Dg Chest Port 1 View  Result Date: 08/21/2018 CLINICAL DATA:  Cardiac arrest. EXAM: PORTABLE CHEST 1 VIEW COMPARISON:  Radiographs of August 20, 2018. FINDINGS: Stable cardiomegaly. Mild central pulmonary vascular congestion is noted. Endotracheal and nasogastric tubes are unchanged in position. Left internal jugular catheter is  unchanged in position. No pneumothorax is noted. Stable bibasilar subsegmental atelectasis or edema is noted with probable small pleural effusions. Bony thorax is unremarkable. IMPRESSION: Stable cardiomegaly with central pulmonary vascular congestion. Stable support apparatus. Stable bibasilar atelectasis or edema is noted with probable small pleural effusions. Electronically Signed   By: Lupita RaiderJames  Green Jr, M.D.   On: 08/21/2018 08:28   Dg Chest Port 1 View  Result Date: 08/20/2018 CLINICAL DATA:  Status post central line placement EXAM: PORTABLE CHEST 1 VIEW COMPARISON:  08/15/2018 FINDINGS: Endotracheal tube is now noted in satisfactory position. Nasogastric catheter extends into the stomach although the tip is not visualized on this image. Left jugular central line is noted in the proximal superior vena cava. No pneumothorax is noted. Mild vascular congestion and bibasilar atelectatic changes are seen. IMPRESSION: Tubes and lines as described above. Vascular congestion and bibasilar atelectatic changes. Electronically Signed   By: Alcide CleverMark  Lukens M.D.   On: 08/20/2018 12:41   Dg  Abd Portable 1v  Result Date: 08/20/2018 CLINICAL DATA:  OG tube placement EXAM: PORTABLE ABDOMEN - 1 VIEW COMPARISON:  None. FINDINGS: Orogastric tube with the tip projecting over the stomach. There is relative paucity of bowel gas. There is no evidence of pneumoperitoneum, portal venous gas or pneumatosis. There are no pathologic calcifications along the expected course of the ureters. The osseous structures are unremarkable. IMPRESSION: Orogastric tube with the tip projecting over the stomach. Electronically Signed   By: Elige KoHetal  Patel   On: 08/20/2018 12:39   Ct Head Code Stroke Wo Contrast  Result Date: 08/22/2018 CLINICAL DATA:  Code stroke. 62 y/o M; aphasia, vision loss, altered mental status, dysarthria. EXAM: CT HEAD WITHOUT CONTRAST TECHNIQUE: Contiguous axial images were obtained from the base of the skull through the vertex without intravenous contrast. COMPARISON:  None. FINDINGS: Brain: Hypodensity in the right inferior cerebellum may represent an acute or subacute infarction (series 5, image 53). No additional area of acute infarction, hemorrhage, or mass effect identified. No extra-axial collection, hydrocephalus, or herniation. Small chronic infarct within the left putamen extending into corona radiata and caudate body. Mild-to-moderate chronic microvascular ischemic changes and volume loss of the brain. Vascular: Calcific atherosclerosis of the carotid siphons. No hyperdense vessel identified. Skull: Normal. Negative for fracture or focal lesion. Sinuses/Orbits: No acute finding. Other: Chronic left lamina papyracea fracture with medial herniation of extraconal fat. ASPECTS [facility]([place] Stroke Program Early CT Score) - Ganglionic level infarction (caudate, lentiform nuclei, internal capsule, insula, M1-M3 cortex): 7 - Supraganglionic infarction (M4-M6 cortex): 3 Total score (0-10 with 10 being normal): 10 IMPRESSION: 1. Small hypodensity within the right inferior cerebellum which may represent an  acute or subacute infarction. 2. No additional acute stroke, hemorrhage, or mass effect identified. 3. Mild-to-moderate chronic microvascular ischemic changes and volume loss of the brain for age. 4. 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 5. ASPECTS is 10 These results were called by telephone at the time of interpretation on 08/22/2018 at 3:03 am to Dr. Amada JupiterKirkpatrick, who verbally acknowledged these results. Electronically Signed   By: Mitzi HansenLance  Furusawa-Stratton M.D.   On: 08/22/2018 03:06   Ir Thoracentesis Asp Pleural Space W/img Guide  Result Date: 09/03/2018 INDICATION: Symptomatic right sided pleural effusion EXAM: IR THORACENTESIS ASP PLEURAL SPACE W/IMG GUIDE COMPARISON:  Previous thoracentesis. MEDICATIONS: 10 cc 2% lidocaine. COMPLICATIONS: None immediate. TECHNIQUE: Informed written consent was obtained from the patient after a discussion of the risks, benefits and alternatives to treatment.  A timeout was performed prior to the initiation of the procedure. Initial ultrasound scanning demonstrates a right pleural effusion. The lower chest was prepped and draped in the usual sterile fashion. 1% lidocaine was used for local anesthesia. Under direct ultrasound guidance, a 19 gauge, 7-cm, Yueh catheter was introduced. An ultrasound image was saved for documentation purposes. The thoracentesis was performed. The catheter was removed and a dressing was applied. The patient tolerated the procedure well without immediate post procedural complication. The patient was escorted to have an upright chest radiograph. FINDINGS: A total of approximately 1.7 liters of light amber fluid was removed. IMPRESSION: Successful ultrasound-guided right sided thoracentesis yielding 1.7 liters of pleural fluid. Read by Robet Leu [facility] Electronically Signed   By: Jolaine Click M.D.   On: 09/03/2018 16:00   Ir Thoracentesis Asp Pleural Space W/img Guide  Result Date:  08/27/2018 INDICATION: Patient with history of coronary artery disease, CHF, bilateral pleural effusions right greater than left. Request received for therapeutic right thoracentesis. EXAM: ULTRASOUND GUIDED THERAPEUTIC RIGHT THORACENTESIS MEDICATIONS: None COMPLICATIONS: None immediate. PROCEDURE: An ultrasound guided thoracentesis was thoroughly discussed with the patient and questions answered. The benefits, risks, alternatives and complications were also discussed. The patient understands and wishes to proceed with the procedure. Written consent was obtained. Ultrasound was performed to localize and mark an adequate pocket of fluid in the right chest. The area was then prepped and draped in the normal sterile fashion. 1% Lidocaine was used for local anesthesia. Under ultrasound guidance a 6 Fr Safe-T-Centesis catheter was introduced. Thoracentesis was performed. The catheter was removed and a dressing applied. FINDINGS: A total of approximately 1.4 liters of amber/blood-tinged fluid was removed. IMPRESSION: Successful ultrasound guided therapeutic right thoracentesis yielding 1.4 liters of pleural fluid. Read by: Jeananne Rama, PA-C Electronically Signed   By: Judie Petit.  Shick M.D.   On: 08/27/2018 16:03   US Thoracentesis Asp Pleural Space W/img Guide  Result Date: 09/04/2018 INDICATION: Patient with history of coronary artery disease, CHF, pleural effusion. Request for therapeutic left thoracentesis. EXAM: ULTRASOUND GUIDED LEFT THORACENTESIS MEDICATIONS: 1% lidocaine 10 mL COMPLICATIONS: None immediate. PROCEDURE: An ultrasound guided thoracentesis was thoroughly discussed with the patient and questions answered. The benefits, risks, alternatives and complications were also discussed. The patient understands and wishes to proceed with the procedure. Written consent was obtained. Ultrasound was performed to localize and mark an adequate pocket of fluid in the left chest. The area was then prepped and draped in the  normal sterile fashion. 1% Lidocaine was used for local anesthesia. Under ultrasound guidance a 6 Fr Safe-T-Centesis catheter was introduced. Thoracentesis was performed. The catheter was removed and a dressing applied. FINDINGS: A total of approximately 800 mL of clear yellow fluid was removed. IMPRESSION: Successful ultrasound guided left thoracentesis yielding 800 mL of pleural fluid. No pneumothorax on post-procedure chest x-ray. Read by: Corrin Parker, PA-C Electronically Signed   By: Gilmer Mor D.O.   On: 09/04/2018 14:08      ASSESSMENT AND PLAN:   1. 1. Chronic chronic systolic CHF: Given complexity of this case, would strongly recommend consultation of the advanced HF service to provide recommendations for treatment regarding this patient. - Ischemic cardiomyopathy,Echo 8/23/19EF 25-30% by echo. - NYHA II-III  - Home regimen is torsemide 60 mg daily; he clinically appears euvolemic, possibly slightly volume depleted. I honestly feel that right now he may be a little dry/dehydrated due to his ongoing vomiting and diarrhea. The small rise in his BUN/Cr may be due  to volume depletion. Would do trial of 500cc bolus and see how he feels, see how his numbers respond - Continue coreg 3.125 mg BID.  -ACE/ARB/spironolactone/digoxin not on board with elevated creatinine.   2. Severe n/v, abdominal pain - unclear etiology. DDx expressed by advanced HF team a few days ago included constipation, gastroenteritis, diabetic gastroparesis vs med-related (?amio). Will defer to internal med svc for further w/u. Would hold amiodarone for now as suggested in HF office note from 09-09-18 Pt feels his sx may be due to metformin, which he has held this evening.   3. Severe multivessel CAD:  - Not thought to be CABG candidate. Interventional cardiology reviewed films at length 08/24/18 and felt not to be candidate for PCI. He does not seem to be having any acute anginal sx - trop actually down from last  check 3 wk ago (5.76 on 08-20-18) - Continue ASA/statin. - Also on coumadin for LV thrombus and ?CVA.   4. LV Thrombus: - INR per coumadin clinic. Goal 2.0-3.0  4. CKD III-IV: Cr has been as low as 1.6 and as high as 3.6. Cr 2.39 ->2.49 ->2.6 ->2.57. UA + protein. Renal US negative.Cr currently 3.02. I feel it may be mild AKI due to slight volume depletion. See above - pt has been referred  to Nephrology as of 09-09-18, but doubtful he has seen them at this point.   5. 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   6. DM2: mgmt as per medicine svc. If he stops metformin due to GI side effect, jardiance may be a good option for him given his significant CAD  Signed,  Precious Reel, MD  09/12/2018 12:03 AM

## 2018-09-12 NOTE — Progress Notes (Signed)
Patient admitted after midnight, please see H&P.  Jose Myers  is a 62 y.o. male, w Hypertension, CAD (NSTEMI 08/15/2018) w h/o VT arrest 08/20/2018 which required CPR, 2 chocks, epi and intubation, post- arrest echo showed L apical thrombone resolved, EF 25%.  , Dm2  CKD stage 4.  He has been c/o n/v, diarrhea 4+ times per day for the past 2 days. Suspect this was from metformin which due to his renal function should not be continued.  He also some some discomfort when laying flat and improves with leaning forward-- ? Pericarditis?  Limited echo and defer to cards.  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  Psych consult. Added incentive spirometry as well.   Jose CanaryJessica Advika Mclelland DO

## 2018-09-12 NOTE — ED Notes (Signed)
Dr Manus Gunningancour informed of lactic acid results 2.36

## 2018-09-12 NOTE — Progress Notes (Signed)
Page to Dr Benjamine MolaVann  3e27 tramadol order "complete" pt requesting pain medication for back.

## 2018-09-12 NOTE — Progress Notes (Signed)
  Echocardiogram 2D Echocardiogram has been performed.  Jose Myers, Jose Myers 09/12/2018, 5:27 PM

## 2018-09-12 NOTE — Consult Note (Addendum)
[facility] Face-to-Face Psychiatry Consult   Reason for Consult: ''?PTSD''  Referring Physician:  Dr. Eliseo Squires Patient Identification: Jose Myers MRN:  [id number] Principal Diagnosis: Adjustment disorder with anxiety Diagnosis:   Patient Active Problem List   Diagnosis Date Noted  . Diarrhea [R19.7] 09/12/2018  . Nausea & vomiting [R11.2] 09/12/2018  . Chronic post-traumatic stress disorder (PTSD) [F43.12] 09/12/2018  . Adjustment disorder with anxiety [F43.22] 09/12/2018  . Acute respiratory failure with hypoxia ([place]) [J96.01]   . Coronary artery disease involving native heart without angina pectoris [I25.10]   . LV (left ventricular) mural thrombus following MI ([place]) [I23.6]   . Delirium [R41.0]   . Encephalopathy [G93.40]   . Stroke due to embolism ([place]) [I63.9] 08/22/2018    Class: Acute  . Cardiac arrest ([place]) [I46.9] 08/20/2018  . NSTEMI (non-ST elevated myocardial infarction) ([place]) [I21.4]   . Hypervolemia [E87.70]   . Acute CHF (congestive heart failure) ([place]) [I50.9] 08/15/2018  . Acute renal failure (ARF) ([place]) [N17.9] 08/15/2018  . Elevated troponin [R74.8] 08/15/2018  . Abdominal pain [R10.9] 08/15/2018  . Type 2 diabetes mellitus with renal manifestations ([place]) [E11.29] 08/15/2018  . Pain in left knee [M25.562] 03/08/2018    Total Time spent with patient: 45 minutes  Subjective:   Jose Myers is a 62 y.o. male patient admitted with shortness of breath.  HPI:  62 y.o. male with history of  Hypertension, CAD (NSTEMI 08/15/2018) w h/o VT arrest 08/20/2018 which required CPR, 2 chocks, epi and intubation, post- arrest echo showed L apical thrombone resolved, EF 25%.  , Dm2  CKD stage 4 who was admitted with shortness of breath and diarrhea 4+ times per day for two days. 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 Patient reports that  prior to current hospitalization he did not sleep for about 3 days and as a result became very apprehensive, fidgety and anxious. He also reports recent heart related events that got him so scared and thanking God that he survive. He reports increased anxiety, nervousness and worries since the cardiac event. Today, patient is calm, cooperative, denies psychosis, delusions, suicidal/homicidal ideations, intent or plan.  Past Psychiatric History: PTSD  Risk to Self:   Risk to Others:   Prior Inpatient Therapy:   Prior Outpatient Therapy:    Past Medical History:  Past Medical History:  Diagnosis Date  . Coronary artery disease   . Diabetes mellitus without complication ([place])   . Hypertension     Past Surgical History:  Procedure Laterality Date  . CARDIAC CATHETERIZATION    . CORONARY ANGIOGRAPHY N/A 08/19/2018   Procedure: CORONARY ANGIOGRAPHY (CATH LAB);  Surgeon: Belva Crome, MD;  Location: [place] CV LAB;  Service: Cardiovascular;  Laterality: N/A;  . EYE SURGERY    . ICD IMPLANT N/A 08/31/2018   Procedure: ICD IMPLANT;  Surgeon: Evans Lance, MD;  Location: [place] CV LAB;  Service: Cardiovascular;  Laterality: N/A;  . IR THORACENTESIS ASP PLEURAL SPACE W/IMG GUIDE  08/27/2018  . IR THORACENTESIS ASP PLEURAL SPACE W/IMG GUIDE  09/03/2018  . PERCUTANEOUS CORONARY STENT INTERVENTION (PCI-S)  04/11/2003   LAD  . RIGHT HEART CATH N/A 08/19/2018   Procedure: RIGHT HEART CATH;  Surgeon: Belva Crome, MD;  Location: [place] CV LAB;  Service: Cardiovascular;  Laterality: N/A;   Family History:  Family History  Problem Relation Age of Onset  . Diabetes Mellitus II Mother   . Throat cancer Brother    Family Psychiatric  History:  Social History:  Social History   Substance and Sexual Activity  Alcohol Use Yes   Comment: Occasionally.     Social History   Substance and Sexual Activity  Drug Use Not on file    Social History   Socioeconomic History  . Marital  status: Married    Spouse name: Not on file  . Number of children: Not on file  . Years of education: Not on file  . Highest education level: Not on file  Occupational History  . Not on file  Social Needs  . Financial resource strain: Not on file  . Food insecurity:    Worry: Not on file    Inability: Not on file  . Transportation needs:    Medical: Not on file    Non-medical: Not on file  Tobacco Use  . Smoking status: Never Smoker  . Smokeless tobacco: Never Used  Substance and Sexual Activity  . Alcohol use: Yes    Comment: Occasionally.  . Drug use: Not on file  . Sexual activity: Not on file  Lifestyle  . Physical activity:    Days per week: Not on file    Minutes per session: Not on file  . Stress: Not on file  Relationships  . Social connections:    Talks on phone: Not on file    Gets together: Not on file    Attends religious service: Not on file    Active member of club or organization: Not on file    Attends meetings of clubs or organizations: Not on file    Relationship status: Not on file  Other Topics Concern  . Not on file  Social History Narrative  . Not on file   Additional Social History:    Allergies:  No Known Allergies  Labs:  Results for orders placed or performed during the hospital encounter of 09/22/2018 (from the past 48 hour(s))  Basic metabolic panel     Status: Abnormal   Collection Time: 09/01/2018 10:25 PM  Result Value Ref Range   Sodium 134 (L) 135 - 145 mmol/L   Potassium 4.4 3.5 - 5.1 mmol/L   Chloride 95 (L) 98 - 111 mmol/L   CO2 26 22 - 32 mmol/L   Glucose, Bld 199 (H) 70 - 99 mg/dL   BUN 34 (H) 8 - 23 mg/dL   Creatinine, Ser 3.02 (H) 0.61 - 1.24 mg/dL   Calcium 8.6 (L) 8.9 - 10.3 mg/dL   GFR calc non Af Amer 20 (L) >60 mL/min   GFR calc Af Amer 24 (L) >60 mL/min    Comment: (NOTE) The eGFR has been calculated using the CKD EPI equation. This calculation has not been validated in all clinical situations. eGFR's  persistently <60 mL/min signify possible Chronic Kidney Disease.    Anion gap 13 5 - 15    Comment: Performed at [facility] [address], [place] [address]  CBC     Status: Abnormal   Collection Time: 09/21/2018 10:25 PM  Result Value Ref Range   WBC 12.4 (H) 4.0 - 10.5 K/uL   RBC 4.55 4.22 - 5.81 MIL/uL   Hemoglobin 12.8 (L) 13.0 - 17.0 g/dL   HCT 39.5 39.0 - 52.0 %   MCV 86.8 78.0 - 100.0 fL   MCH 28.1 26.0 - 34.0 pg  MCHC 32.4 30.0 - 36.0 g/dL   RDW 14.6 11.5 - 15.5 %   Platelets 224 150 - 400 K/uL    Comment: Performed at [facility], [place] [address] [place], [place] [address]  I-stat troponin, ED     Status: Abnormal   Collection Time: 08/30/2018 10:30 PM  Result Value Ref Range   Troponin i, poc 0.19 (HH) 0.00 - 0.08 ng/mL   Comment NOTIFIED PHYSICIAN    Comment 3            Comment: Due to the release kinetics of cTnI, a negative result within the first hours of the onset of symptoms does not rule out myocardial infarction with certainty. If myocardial infarction is still suspected, repeat the test at appropriate intervals.   Protime-INR     Status: Abnormal   Collection Time: 09/05/2018 11:18 PM  Result Value Ref Range   Prothrombin Time 26.8 (H) 11.4 - 15.2 seconds   INR 2.50     Comment: Performed at [facility] [address] [place], [place] [address]  Troponin I     Status: Abnormal   Collection Time: 09/03/2018 11:18 PM  Result Value Ref Range   Troponin I 0.43 (HH) <0.03 ng/mL    Comment: CRITICAL RESULT CALLED TO, READ BACK BY AND VERIFIED WITH: STRAUGHAN C,RN 09/26/2018 2346 [id number] Performed at [facility], [place] [address], [place], [place] [address]   Brain natriuretic peptide     Status: Abnormal   Collection Time: 08/30/2018 11:18 PM  Result Value Ref Range   B Natriuretic Peptide 1,989.1 (H) 0.0 - 100.0 pg/mL    Comment: Performed at [facility] [address], [place], [place] [address]  POC occult blood, ED      Status: None   Collection Time: 08/31/2018 11:55 PM  Result Value Ref Range   Fecal Occult Bld NEGATIVE NEGATIVE  I-Stat CG4 Lactic Acid, ED     Status: Abnormal   Collection Time: 09/12/18 12:57 AM  Result Value Ref Range   Lactic Acid, Venous 2.36 (HH) 0.5 - 1.9 mmol/L   Comment NOTIFIED PHYSICIAN   CG4 I-STAT (Lactic acid)     Status: None   Collection Time: 09/12/18  2:14 AM  Result Value Ref Range   Lactic Acid, Venous 1.69 0.5 - 1.9 mmol/L  Comprehensive metabolic panel     Status: Abnormal   Collection Time: 09/12/18  2:53 AM  Result Value Ref Range   Sodium 134 (L) 135 - 145 mmol/L   Potassium 4.5 3.5 - 5.1 mmol/L   Chloride 97 (L) 98 - 111 mmol/L   CO2 21 (L) 22 - 32 mmol/L   Glucose, Bld 195 (H) 70 - 99 mg/dL   BUN 34 (H) 8 - 23 mg/dL   Creatinine, Ser 2.98 (H) 0.61 - 1.24 mg/dL   Calcium 8.8 (L) 8.9 - 10.3 mg/dL   Total Protein 6.2 (L) 6.5 - 8.1 g/dL   Albumin 3.3 (L) 3.5 - 5.0 g/dL   AST 35 15 - 41 U/L   ALT 23 0 - 44 U/L   Alkaline Phosphatase 65 38 - 126 U/L   Total Bilirubin 1.3 (H) 0.3 - 1.2 mg/dL   GFR calc non Af Amer 21 (L) >60 mL/min   GFR calc Af Amer 24 (L) >60 mL/min    Comment: (NOTE) The eGFR has been calculated using the CKD EPI equation. This calculation has not been validated in all clinical situations.  eGFR's persistently <60 mL/min signify possible Chronic Kidney Disease.    Anion gap 16 (H) 5 - 15    Comment: Performed at [facility], [place] [address] [place] [address]  CBC     Status: Abnormal   Collection Time: 09/12/18  2:53 AM  Result Value Ref Range   WBC 13.9 (H) 4.0 - 10.5 K/uL   RBC 4.70 4.22 - 5.81 MIL/uL   Hemoglobin 13.1 13.0 - 17.0 g/dL   HCT 42.1 39.0 - 52.0 %   MCV 89.6 78.0 - 100.0 fL   MCH 27.9 26.0 - 34.0 pg   MCHC 31.1 30.0 - 36.0 g/dL   RDW 14.6 11.5 - 15.5 %   Platelets 239 150 - 400 K/uL    Comment: Performed at [facility], [place] [address] [place], [place] [address]  Glucose, capillary      Status: Abnormal   Collection Time: 09/12/18  9:18 AM  Result Value Ref Range   Glucose-Capillary 212 (H) 70 - 99 mg/dL  Glucose, capillary     Status: Abnormal   Collection Time: 09/12/18 12:37 PM  Result Value Ref Range   Glucose-Capillary 226 (H) 70 - 99 mg/dL    Current Facility-Administered Medications  Medication Dose Route Frequency Provider Last Rate Last Dose  . acetaminophen (TYLENOL) tablet 650 mg  650 mg Oral Q6H PRN Jani Gravel, MD   650 mg at 09/12/18 3710   Or  . acetaminophen (TYLENOL) suppository 650 mg  650 mg Rectal Q6H PRN Jani Gravel, MD      . aspirin EC tablet 81 mg  81 mg Oral Daily Jani Gravel, MD   81 mg at 09/12/18 0855  . atorvastatin (LIPITOR) tablet 40 mg  40 mg Oral q1800 Jani Gravel, MD      . carvedilol (COREG) tablet 3.125 mg  3.125 mg Oral BID WC Jani Gravel, MD   3.125 mg at 09/12/18 6269  . glimepiride (AMARYL) tablet 4 mg  4 mg Oral BID WC Jani Gravel, MD   4 mg at 09/12/18 0854  . insulin aspart (novoLOG) injection 0-5 Units  0-5 Units Subcutaneous QHS Vann, Jessica U, DO      . insulin aspart (novoLOG) injection 0-9 Units  0-9 Units Subcutaneous TID WC Vann, Jessica U, DO   3 Units at 09/12/18 1335  . LORazepam (ATIVAN) tablet 0.5-1 mg  0.5-1 mg Oral Q6H PRN Eulogio Bear U, DO   0.5 mg at 09/12/18 1056  . multivitamin with minerals tablet 1 tablet  1 tablet Oral Daily Jani Gravel, MD   1 tablet at 09/12/18 0855  . ondansetron (ZOFRAN) injection 4 mg  4 mg Intravenous Q6H PRN Jani Gravel, MD      . Derrill Memo ON 09/13/2018] sertraline (ZOLOFT) tablet 50 mg  50 mg Oral Daily Jarel Cuadra, MD      . traZODone (DESYREL) tablet 50 mg  50 mg Oral QHS PRN Jani Gravel, MD      . warfarin (COUMADIN) tablet 2 mg  2 mg Oral q1800 Jani Gravel, MD      . Warfarin - Pharmacist Dosing Inpatient   Does not apply [id number] Jani Gravel, MD        Musculoskeletal: Strength & Muscle Tone: not tested Gait & Station: unable to stand Patient leans: N/A  Psychiatric Specialty  Exam: Physical Exam  Psychiatric: His speech is normal and behavior is normal. Judgment and thought content normal. His mood appears anxious. Cognition and memory  are normal.    Review of Systems  Constitutional: Negative.   HENT: Negative.   Respiratory: Positive for shortness of breath.   Cardiovascular: Positive for orthopnea and leg swelling.  Skin: Negative.   Endo/Heme/Allergies: Negative.   Psychiatric/Behavioral: The patient is nervous/anxious.     Blood pressure 110/80, pulse 93, temperature (!) 97.5 F (36.4 C), temperature source Oral, resp. rate (!) 22, height _0  (1.753 m), weight 89.1 kg, SpO2 95 %.Body mass index is 29 kg/m.  General Appearance: Casual  Eye Contact:  Good  Speech:  Clear and Coherent  Volume:  Normal  Mood:  Anxious  Affect:  Appropriate  Thought Process:  Coherent and Linear  Orientation:  Full (Time, Place, and Person)  Thought Content:  Logical  Suicidal Thoughts:  No  Homicidal Thoughts:  No  Memory:  Immediate;   Fair Recent;   Fair Remote;   Fair  Judgement:  Fair  Insight:  Good  Psychomotor Activity:  Decreased  Concentration:  Concentration: Fair and Attention Span: Fair  Recall:  Good  Fund of Knowledge:  Good  Language:  Good  Akathisia:  No  Handed:  Right  AIMS (if indicated):     Assets:  Communication Skills Desire for Improvement Social Support Others:  family support  ADL's:  marginal  Cognition:  WNL  Sleep:   fair     Treatment Plan Summary: -Consider Increasing Zoloft from 25 mg to 50 mg daily to address anxiety/PTSD -Continue 50 mg po daily at bedtime as needed for insomnia. -Psychiatric service signing out. Re-consult as needed.  Disposition: No evidence of imminent risk to self or others at present.   Patient does not meet criteria for psychiatric inpatient admission. Supportive therapy provided about ongoing stressors. Patient will benefit from referral to his PCP/Psychiatrist for medication management  of PTSD/Anxiety after he is discharge  Corena Pilgrim, MD 09/12/2018 3:00 PM

## 2018-09-12 NOTE — H&P (Addendum)
TRH H&P   Patient Demographics:    Jose Myers, is a 62 y.o. male  MRN: [id number]   DOB - December 04, 1956  Admit Date - 09/13/2018  Outpatient Primary MD for the patient is Daisy Floro, MD  Referring MD/NP/PA:   [place]  Outpatient Specialists:     Patient coming from: home  Chief Complaint  Patient presents with  . Shortness of Breath      HPI:    Jose Myers  is a 62 y.o. male, w Hypertension, CAD (NSTEMI 08/15/2018) w h/o VT arrest 08/20/2018 which required CPR, 2 chocks, epi and intubation, post- arrest echo showed L apical thrombone resolved, EF 25%.  , Dm2  CKD stage 4, apparently c/o n/v, diarrhea 4+ times per day for the past 2 days.  Pt denies fever, chills, cough, cp, palp, sob, abd pain, brbpr, dysuria, hematuria.  Pt presented due to the n/v, diarrhea.  Denies recent travel or sick contacts.  Pt states that Dr. Clarise Cruz told him to stop his Amiodarone. This has not made a significant difference. Pt has tried oral medication for nausea and vomitting and diarrhea without success.   In Ed,  T 98.6, P 91  RR 27  Bp 100/80  Pox 92-97% on RA  CT scan abd/ pelvis IMPRESSION: 1. Fluid within mildly distended stomach, may be related to p.o. intake. No evidence of gastric inflammation or wall thickening to suggest gastritis. 2. Mild hepatic steatosis. High-density gallbladder contents may be sludge or stones. No gallbladder inflammation. 3. Small to moderate right and small left pleural effusion in the included lung bases, similar to slightly increased from CT 1 month ago. 4.  Aortic Atherosclerosis (ICD10-I70.0).  CXR IMPRESSION: 1. Small residual pleural effusion, improved on the RIGHT. Residual bibasilar airspace opacities. 2. Mild cardiomegaly. 3. Mild chronic interstitial changes.   Na 134, K 4.4, Bun 34, creatinine 3.02 Glucose 199 Wbc 12.4, Hgb  12.8, Plt 224  Trop 0.19  (down from 0.88) BNP 1,989.1  INR 2.50  Pt will be admitted for n/v, diarrhea likely secondary to metformin in the setting of ARF.    Review of systems:    In addition to the HPI above,  No Fever-chills, No Headache, No changes with Vision or hearing, No problems swallowing food or Liquids, No Chest pain, Cough or Shortness of Breath, No Abdominal pain, No Blood in stool or Urine, No dysuria, No new skin rashes or bruises, No new joints pains-aches,  No new weakness, tingling, numbness in any extremity, No recent weight gain or loss, No polyuria, polydypsia or polyphagia, No significant Mental Stressors.  A full 10 point Review of Systems was done, except as stated above, all other Review of Systems were negative.   With Past History of the following :    Past Medical History:  Diagnosis Date  . Coronary artery disease   . Diabetes mellitus  without complication (HCC)   . Hypertension       Past Surgical History:  Procedure Laterality Date  . CARDIAC CATHETERIZATION    . CORONARY ANGIOGRAPHY N/A 08/19/2018   Procedure: CORONARY ANGIOGRAPHY (CATH LAB);  Surgeon: Lyn RecordsSmith, Henry W, MD;  [facility];  Service: Cardiovascular;  Laterality: N/A;  . EYE SURGERY    . ICD IMPLANT N/A 08/31/2018   Procedure: ICD IMPLANT;  Surgeon: Marinus Mawaylor, Gregg W, MD;  [facility];  Service: Cardiovascular;  Laterality: N/A;  . IR THORACENTESIS ASP PLEURAL SPACE W/IMG GUIDE  08/27/2018  . IR THORACENTESIS ASP PLEURAL SPACE W/IMG GUIDE  09/03/2018  . PERCUTANEOUS CORONARY STENT INTERVENTION (PCI-S)  04/11/2003   LAD  . RIGHT HEART CATH N/A 08/19/2018   Procedure: RIGHT HEART CATH;  Surgeon: Lyn RecordsSmith, Henry W, MD;  [facility];  Service: Cardiovascular;  Laterality: N/A;      Social History:     Social History   Tobacco Use  . Smoking status: Never Smoker  . Smokeless tobacco: Never Used  Substance Use Topics  .  Alcohol use: Yes    Comment: Occasionally.     Lives - at home  Mobility - walks by self   Family History :     Family History  Problem Relation Age of Onset  . Diabetes Mellitus II Mother   . Throat cancer Brother        Home Medications:   Prior to Admission medications   Medication Sig Start Date End Date Taking? Authorizing Provider  Apple Cid Vn-Grn Tea-Bit Or-Cr (APPLE CIDER VINEGAR PLUS) TABS Take 1 tablet by mouth daily.   Yes [provider]  aspirin 81 MG EC tablet Take 1 tablet (81 mg total) by mouth daily. 09/07/18  Yes Alford HighlandSmith, Ashley M, NP  atorvastatin (LIPITOR) 40 MG tablet Take 1 tablet (40 mg total) by mouth daily at 6 PM. 09/06/18  Yes Alford HighlandSmith, Ashley M, NP  carvedilol (COREG) 6.25 MG tablet Take 0.5 tablets (3.125 mg total) by mouth 2 (two) times daily with a meal. 09/06/18  Yes Alford HighlandSmith, Ashley M, NP  docusate sodium (COLACE) 100 MG capsule Take 1 capsule (100 mg total) by mouth 2 (two) times daily as needed for mild constipation. 09/06/18  Yes Alford HighlandSmith, Ashley M, NP  glimepiride (AMARYL) 4 MG tablet Take 4 mg by mouth 2 (two) times daily.   Yes [provider]  LORazepam (ATIVAN) 1 MG tablet Take 0.5-1 mg by mouth daily as needed for anxiety.   Yes [provider]  metFORMIN (GLUCOPHAGE) 500 MG tablet Take 500 mg by mouth 3 (three) times daily.    Yes [provider]  Multiple Vitamin (MULTIVITAMIN WITH MINERALS) TABS tablet Take 1 tablet by mouth daily. 09/07/18  Yes Alford HighlandSmith, Ashley M, NP  Omega 3 340 MG CPDR Take 1 capsule by mouth at bedtime.   Yes [provider]  ondansetron (ZOFRAN-ODT) 4 MG disintegrating tablet Take 1 tablet (4 mg total) by mouth every 8 (eight) hours as needed for nausea or vomiting. 09/09/18  Yes Bensimhon, Bevelyn Bucklesaniel R, MD  polyethylene glycol (MIRALAX / GLYCOLAX) packet Take 17 g by mouth daily as needed for moderate constipation. 09/06/18  Yes Alford HighlandSmith, Ashley M, NP  potassium chloride SA (K-DUR,KLOR-CON) 20 MEQ  tablet Take 3 tablets (60 mEq total) by mouth daily. 09/09/18  Yes Bensimhon, Bevelyn Bucklesaniel R, MD  sertraline (ZOLOFT) 50 MG tablet Take 25 mg by mouth daily. 07/13/18  Yes [provider]  torsemide (DEMADEX) 20 MG tablet Take 3 tablets (60 mg total) by mouth daily. 09/07/18  Yes Alford Highland, NP  traZODone (DESYREL) 50 MG tablet Take 50 mg by mouth at bedtime as needed for sleep.  08/13/18  Yes [provider]  warfarin (COUMADIN) 2 MG tablet Take 1 tablet (2 mg total) by mouth daily. 09/06/18 09/06/19 Yes Alford Highland, NP  feeding supplement, GLUCERNA SHAKE, (GLUCERNA SHAKE) LIQD Take 237 mLs by mouth 2 (two) times daily between meals. Patient not taking: Reported on 09/12/2018 09/06/18   Alford Highland, NP     Allergies:    No Known Allergies   Physical Exam:   Vitals  Blood pressure 99/76, pulse 95, temperature 98.6 F (37 C), temperature source Oral, resp. rate (!) 26, height 5\' 9"  (1.753 m), weight 87.5 kg, SpO2 97 %.   1. General  lying in bed in NAD,    2. Normal affect and insight, Not Suicidal or Homicidal, Awake Alert, Oriented X 3.  3. No F.N deficits, ALL C.Nerves Intact, Strength 5/5 all 4 extremities, Sensation intact all 4 extremities, Plantars down going.  4. Ears and Eyes appear Normal, Conjunctivae clear, PERRLA. Moist Oral Mucosa.  5. Supple Neck, No JVD, No cervical lymphadenopathy appriciated, No Carotid Bruits.  6. Symmetrical Chest wall movement, Good air movement bilaterally, slight decrease in bs at bilateral base, othewise CTAB.  7. RRR, s1, s2, 2/6 sem apex  8. Positive Bowel Sounds, Abdomen Soft, No tenderness, No organomegaly appriciated,No rebound -guarding or rigidity.  9.  No Cyanosis, Normal Skin Turgor, No Skin Rash or Bruise.  10. Good muscle tone,  joints appear normal , no effusions, Normal ROM.  11. No Palpable Lymph Nodes in Neck or Axillae     Data Review:    CBC Recent Labs  Lab 09/05/18 0217 09/06/18 0336  09/09/18 1433 09-28-2018 2225  WBC 11.2* 8.2 10.1 12.4*  HGB 12.0* 11.5* 12.9* 12.8*  HCT 36.8* 35.0* 40.2 39.5  PLT 218 183 250 224  MCV 85.6 84.7 86.5 86.8  MCH 27.9 27.8 27.7 28.1  MCHC 32.6 32.9 32.1 32.4  RDW 14.5 14.3 14.5 14.6   ------------------------------------------------------------------------------------------------------------------  Chemistries  Recent Labs  Lab 09/05/18 0217 09/06/18 0336 09/09/18 1433 09-28-2018 2225  NA 134* 133* 137 134*  K 3.3* 2.8* 3.1* 4.4  CL 93* 91* 98 95*  CO2 29 31 27 26   GLUCOSE 157* 202* 151* 199*  BUN 42* 43* 23 34*  CREATININE 2.57* 2.59* 2.27* 3.02*  CALCIUM 8.4* 8.2* 8.9 8.6*  MG  --  1.9  --   --   AST  --   --  26  --   ALT  --   --  17  --   ALKPHOS  --   --  66  --   BILITOT  --   --  1.2  --    ------------------------------------------------------------------------------------------------------------------ estimated creatinine clearance is 27.1 mL/min (A) (by C-G formula based on SCr of 3.02 mg/dL (H)). ------------------------------------------------------------------------------------------------------------------ No results for input(s): TSH, T4TOTAL, T3FREE, THYROIDAB in the last 72 hours.  Invalid input(s): FREET3  Coagulation profile Recent Labs  Lab 09/05/18 0217 09/06/18 0336 09/09/18 1433 09/28/18 2318  INR 2.57 2.48 2.00 2.50   ------------------------------------------------------------------------------------------------------------------- No results for input(s): DDIMER in the last 72 hours. -------------------------------------------------------------------------------------------------------------------  Cardiac Enzymes Recent Labs  Lab 09/28/2018 2318  TROPONINI 0.43*   ------------------------------------------------------------------------------------------------------------------    Component Value Date/Time   BNP 1,989.1 (  H) 09/12/2018 2318      ---------------------------------------------------------------------------------------------------------------  Urinalysis    Component Value Date/Time   COLORURINE YELLOW 08/22/2018 1013   APPEARANCEUR CLOUDY (A) 08/22/2018 1013   LABSPEC 1.018 08/22/2018 1013   PHURINE 5.0 08/22/2018 1013   GLUCOSEU NEGATIVE 08/22/2018 1013   HGBUR LARGE (A) 08/22/2018 1013   BILIRUBINUR NEGATIVE 08/22/2018 1013   KETONESUR NEGATIVE 08/22/2018 1013   PROTEINUR 30 (A) 08/22/2018 1013   NITRITE NEGATIVE 08/22/2018 1013   LEUKOCYTESUR NEGATIVE 08/22/2018 1013    ----------------------------------------------------------------------------------------------------------------   Imaging Results:    Ct Abdomen Pelvis Wo Contrast  Result Date: 09/12/2018 CLINICAL DATA:  Chest tightness. Abd pain, gastroenteritis or colitis suspected EXAM: CT ABDOMEN AND PELVIS WITHOUT CONTRAST TECHNIQUE: Multidetector CT imaging of the abdomen and pelvis was performed following the standard protocol without IV contrast. COMPARISON:  Abdominal CT 08/15/2018 FINDINGS: Lower chest: Small to moderate right and small left pleural effusion with adjacent atelectasis, similar or slightly increased from prior CT. There are coronary artery calcifications. Hepatobiliary: Diffusely decreased hepatic density consistent with steatosis. Punctate hepatic granuloma. High-density gallbladder contents may represent sludge or stones, no pericholecystic inflammation. No biliary dilatation. No ductal dilatation or inflammation. Pancreas: No ductal dilatation or inflammation. Spleen: Normal in size without focal abnormality. Adrenals/Urinary Tract: Normal adrenal glands. No hydronephrosis or perinephric edema. No urolithiasis. Urinary bladder is partially distended and unremarkable. Stomach/Bowel: Bowel evaluation is limited in the absence of enteric and IV contrast. Mild gastric distention with fluid/ingested contents. No small bowel dilatation  to suggest obstruction. No evidence of wall thickening or inflammation. Normal appendix. No evidence of colonic inflammation, descending and sigmoid colon are decompressed. Vascular/Lymphatic: Aorta bi-iliac atherosclerosis, mild. Small upper abdominal or retroperitoneal nodes not enlarged by size criteria. Reproductive: Prostate is unremarkable. Other: Fat in both inguinal canals. No abdominal ascites. No free air. Musculoskeletal: There are no acute or suspicious osseous abnormalities. Degenerative change in the spine. IMPRESSION: 1. Fluid within mildly distended stomach, may be related to p.o. intake. No evidence of gastric inflammation or wall thickening to suggest gastritis. 2. Mild hepatic steatosis. High-density gallbladder contents may be sludge or stones. No gallbladder inflammation. 3. Small to moderate right and small left pleural effusion in the included lung bases, similar to slightly increased from CT 1 month ago. 4.  Aortic Atherosclerosis (ICD10-I70.0). Electronically Signed   By: Narda Rutherford M.D.   On: 09/12/2018 00:21   Dg Chest 2 View  Result Date: 09/26/2018 CLINICAL DATA:  LEFT rib pain after CPR 1 week ago. EXAM: CHEST - 2 VIEW COMPARISON:  Chest radiograph September 04, 2018 FINDINGS: Cardiac silhouette is mildly enlarged unchanged. Similar interstitial prominence with small bilateral pleural effusions, improved on the RIGHT. Persistent though improved bibasilar airspace opacities. No pneumothorax. Single lead LEFT AICD. Osseous structures are non suspicious. IMPRESSION: 1. Small residual pleural effusion, improved on the RIGHT. Residual bibasilar airspace opacities. 2. Mild cardiomegaly. 3. Mild chronic interstitial changes. Electronically Signed   By: Awilda Metro M.D.   On: 08/30/2018 22:54    ekg nsr at 90, LAD, poor R progression, in q in 1, avl, and slight st depression in v6   Assessment & Plan:    Active Problems:   Diarrhea    N/v zofran 4mg  iv q6h prn  STOP  METFORMIN  Diarrhea C. Diff,  Gi pathogen panel HOLD miralax, HOLD colace STOP METFORMIN  ARF  HOLD Torsemide Per cardiology trial iv x1 We will start with hydration with ns at 50mL per hour  x 5 hours Check cmp in am  CHF (EF 25%), chronic systolic CHF CAD s/p NSTEMI H/o LV Thrombus Hold Torsemide in AM Cont Aspirin Cont Lipitor Cont Carvedilol 6.25mg  po bid Cont Coumadin, pharmacy to dose Cardiology consulted by ED, appreciate input  Bilateral pleural effusions, monitor  Troponin elevation Likely secondary to h/o NSTEMI Tele Trop I q6h x3  Dm2 Cont Amaryl fsbs ac and qhs, ISS  Anxiety Cont Zoloft, if diarrhea is persistent then consider stopping this as can cause GI upset Cont Ativan Cont Trazodone    DVT Prophylaxis Heparin -  Lovenox - SCDs   AM Labs Ordered, also please review Full Orders  Family Communication: Admission, patients condition and plan of care including tests being ordered have been discussed with the patient who indicate understanding and agree with the plan and Code Status.  Code Status  FULL CODE  Likely DC to  home  Condition GUARDED    Consults called: cardiology by ED  Admission status: observation,  Pt will be admitted to hospital to w/up n/v, diarrhea, and ARF on CRF. Pt will need gentle iv hydration and close monitoring clinically to prevent CHF. If renal failure or diarrhea not improving may need inpatient admission > 2 nites stay.   Time spent in minutes : 70   Pearson Grippe M.D on 09/12/2018 at 1:51 AM  Between 7am to 7pm - Pager - [phone]  . After 7pm go to [email or web address] - password [facility]  [facility] - Office  [phone]

## 2018-09-12 NOTE — Progress Notes (Signed)
Incentive spirometer was provided to patient this morning; pt reports familiarity with the device. Pt advised to utilize the spirometer.  Pt provided ativan and tramadol per PRN orders/request. Pt reminded to utilize PRN O2 at bedside to breathe through anxiety while medication takes effect.   Pt refuses bedalarm when wife is at bedside. Pt and wife educated on use on bed alarm, and asked to inform staff when she wont be at bedside so that we can turn on the alarm.

## 2018-09-12 NOTE — Progress Notes (Signed)
Results for Jose RasmussenSCONTRIAS, Jose L (MRN [id number]) as of 09/12/2018 09:19  Ref. Range 09/12/2018 09:18  Glucose-Capillary Latest Ref Range: 70 - 99 mg/dL 409212 (H)    Glucose checked after eating.

## 2018-09-12 NOTE — Progress Notes (Signed)
ANTICOAGULATION CONSULT NOTE - Initial Consult  Pharmacy Consult for Coumadin Indication: LV thrombus  No Known Allergies  Patient Measurements: Height: 5\' 9"  (175.3 cm) Weight: 193 lb (87.5 kg) IBW/kg (Calculated) : 70.7  Vital Signs: Temp: 98.6 F (37 C) (09/14 2218) Temp Source: Oral (09/14 2218) BP: 100/80 (09/15 0231) Pulse Rate: 78 (09/15 0231)  Labs: Recent Labs    09/09/18 1433 09/14/2018 2225 09/12/2018 2318  HGB 12.9* 12.8*  --   HCT 40.2 39.5  --   PLT 250 224  --   LABPROT 22.5*  --  26.8*  INR 2.00  --  2.50  CREATININE 2.27* 3.02*  --   TROPONINI  --   --  0.43*    Estimated Creatinine Clearance: 27.1 mL/min (A) (by C-G formula based on SCr of 3.02 mg/dL (H)).   Medical History: Past Medical History:  Diagnosis Date  . Coronary artery disease   . Diabetes mellitus without complication (HCC)   . Hypertension     Medications:  No current facility-administered medications on file prior to encounter.    Current Outpatient Medications on File Prior to Encounter  Medication Sig Dispense Refill  . Apple Cid Vn-Grn Tea-Bit Or-Cr (APPLE CIDER VINEGAR PLUS) TABS Take 1 tablet by mouth daily.    Marland Kitchen. aspirin 81 MG EC tablet Take 1 tablet (81 mg total) by mouth daily. 30 tablet 6  . atorvastatin (LIPITOR) 40 MG tablet Take 1 tablet (40 mg total) by mouth daily at 6 PM. 30 tablet 6  . carvedilol (COREG) 6.25 MG tablet Take 0.5 tablets (3.125 mg total) by mouth 2 (two) times daily with a meal. 30 tablet 6  . docusate sodium (COLACE) 100 MG capsule Take 1 capsule (100 mg total) by mouth 2 (two) times daily as needed for mild constipation. 10 capsule 0  . glimepiride (AMARYL) 4 MG tablet Take 4 mg by mouth 2 (two) times daily.    Marland Kitchen. LORazepam (ATIVAN) 1 MG tablet Take 0.5-1 mg by mouth daily as needed for anxiety.    . metFORMIN (GLUCOPHAGE) 500 MG tablet Take 500 mg by mouth 3 (three) times daily.     . Multiple Vitamin (MULTIVITAMIN WITH MINERALS) TABS tablet Take 1  tablet by mouth daily. 30 tablet 6  . Omega 3 340 MG CPDR Take 1 capsule by mouth at bedtime.    . ondansetron (ZOFRAN-ODT) 4 MG disintegrating tablet Take 1 tablet (4 mg total) by mouth every 8 (eight) hours as needed for nausea or vomiting. 20 tablet 0  . polyethylene glycol (MIRALAX / GLYCOLAX) packet Take 17 g by mouth daily as needed for moderate constipation. 14 each 0  . potassium chloride SA (K-DUR,KLOR-CON) 20 MEQ tablet Take 3 tablets (60 mEq total) by mouth daily. 90 tablet 6  . sertraline (ZOLOFT) 50 MG tablet Take 25 mg by mouth daily.  4  . torsemide (DEMADEX) 20 MG tablet Take 3 tablets (60 mg total) by mouth daily. 90 tablet 6  . traZODone (DESYREL) 50 MG tablet Take 50 mg by mouth at bedtime as needed for sleep.   0  . warfarin (COUMADIN) 2 MG tablet Take 1 tablet (2 mg total) by mouth daily. 30 tablet 11  . feeding supplement, GLUCERNA SHAKE, (GLUCERNA SHAKE) LIQD Take 237 mLs by mouth 2 (two) times daily between meals. (Patient not taking: Reported on 09/12/2018) 60 Can 0  . [DISCONTINUED] Prenat-FePoly-Fered-FA-Omega 3 (DUET DHA 430 PO) Take 1 capsule by mouth daily.  Assessment: 62 y.o. male admitted with N/V/D, h/o LV thrombus, to continue Coumadin Goal of Therapy:  INR 2-3 Monitor platelets by anticoagulation protocol: Yes   Plan:  Continue home Coumadin regimen Daily INR  Niccolas Loeper, Gary Fleet 09/12/2018,2:45 AM

## 2018-09-13 ENCOUNTER — Ambulatory Visit: Payer: BLUE CROSS/BLUE SHIELD

## 2018-09-13 ENCOUNTER — Observation Stay (HOSPITAL_COMMUNITY): Payer: BLUE CROSS/BLUE SHIELD

## 2018-09-13 DIAGNOSIS — N170 Acute kidney failure with tubular necrosis: Secondary | ICD-10-CM | POA: Diagnosis not present

## 2018-09-13 DIAGNOSIS — E1121 Type 2 diabetes mellitus with diabetic nephropathy: Secondary | ICD-10-CM | POA: Diagnosis not present

## 2018-09-13 DIAGNOSIS — F4322 Adjustment disorder with anxiety: Secondary | ICD-10-CM | POA: Diagnosis not present

## 2018-09-13 DIAGNOSIS — K5900 Constipation, unspecified: Secondary | ICD-10-CM | POA: Diagnosis not present

## 2018-09-13 DIAGNOSIS — J189 Pneumonia, unspecified organism: Secondary | ICD-10-CM

## 2018-09-13 DIAGNOSIS — R112 Nausea with vomiting, unspecified: Secondary | ICD-10-CM | POA: Diagnosis not present

## 2018-09-13 LAB — CBC
HCT: 39.5 % (ref 39.0–52.0)
HEMOGLOBIN: 12.7 g/dL — AB (ref 13.0–17.0)
MCH: 27.9 pg (ref 26.0–34.0)
MCHC: 32.2 g/dL (ref 30.0–36.0)
MCV: 86.6 fL (ref 78.0–100.0)
PLATELETS: 223 10*3/uL (ref 150–400)
RBC: 4.56 MIL/uL (ref 4.22–5.81)
RDW: 14.8 % (ref 11.5–15.5)
WBC: 14.7 10*3/uL — AB (ref 4.0–10.5)

## 2018-09-13 LAB — GLUCOSE, CAPILLARY
GLUCOSE-CAPILLARY: 147 mg/dL — AB (ref 70–99)
GLUCOSE-CAPILLARY: 207 mg/dL — AB (ref 70–99)
GLUCOSE-CAPILLARY: 211 mg/dL — AB (ref 70–99)
Glucose-Capillary: 167 mg/dL — ABNORMAL HIGH (ref 70–99)

## 2018-09-13 LAB — BASIC METABOLIC PANEL
ANION GAP: 14 (ref 5–15)
BUN: 39 mg/dL — ABNORMAL HIGH (ref 8–23)
CALCIUM: 8.8 mg/dL — AB (ref 8.9–10.3)
CO2: 25 mmol/L (ref 22–32)
Chloride: 94 mmol/L — ABNORMAL LOW (ref 98–111)
Creatinine, Ser: 2.98 mg/dL — ABNORMAL HIGH (ref 0.61–1.24)
GFR, EST AFRICAN AMERICAN: 24 mL/min — AB (ref >60)
GFR, EST NON AFRICAN AMERICAN: 21 mL/min — AB (ref >60)
Glucose, Bld: 212 mg/dL — ABNORMAL HIGH (ref 70–99)
Potassium: 4.8 mmol/L (ref 3.5–5.1)
SODIUM: 133 mmol/L — AB (ref 135–145)

## 2018-09-13 LAB — ECHOCARDIOGRAM LIMITED
HEIGHTINCHES: 69 in
Weight: 3142.4 oz

## 2018-09-13 LAB — PROTIME-INR
INR: 3.74
PROTHROMBIN TIME: 36.7 s — AB (ref 11.4–15.2)

## 2018-09-13 LAB — PROCALCITONIN

## 2018-09-13 MED ORDER — BISACODYL 10 MG RE SUPP
10.0000 mg | Freq: Every day | RECTAL | Status: DC | PRN
  Filled 2018-09-13: qty 1

## 2018-09-13 MED ORDER — SODIUM CHLORIDE 0.9 % IV SOLN
1.0000 g | INTRAVENOUS | Status: DC
  Administered 2018-09-13 – 2018-09-18 (×6): 1 g via INTRAVENOUS
  Filled 2018-09-13 (×7): qty 1

## 2018-09-13 MED ORDER — DOCUSATE SODIUM 100 MG PO CAPS
100.0000 mg | ORAL_CAPSULE | Freq: Two times a day (BID) | ORAL | Status: DC | PRN
  Administered 2018-09-13: 100 mg via ORAL
  Filled 2018-09-13: qty 1

## 2018-09-13 MED ORDER — POLYETHYLENE GLYCOL 3350 17 G PO PACK
17.0000 g | PACK | Freq: Every day | ORAL | Status: DC | PRN
  Administered 2018-09-14: 17 g via ORAL
  Filled 2018-09-13: qty 1

## 2018-09-13 MED ORDER — VANCOMYCIN HCL IN DEXTROSE 1-5 GM/200ML-% IV SOLN
1000.0000 mg | INTRAVENOUS | Status: DC
  Administered 2018-09-14 – 2018-09-15 (×2): 1000 mg via INTRAVENOUS
  Filled 2018-09-13 (×3): qty 200

## 2018-09-13 MED ORDER — VANCOMYCIN HCL 10 G IV SOLR
1750.0000 mg | Freq: Once | INTRAVENOUS | Status: AC
  Administered 2018-09-13: 1750 mg via INTRAVENOUS
  Filled 2018-09-13: qty 1750

## 2018-09-13 NOTE — Progress Notes (Signed)
PHARMACY NOTE:  ANTIMICROBIAL RENAL DOSAGE ADJUSTMENT  Current antimicrobial regimen includes a mismatch between antimicrobial dosage and estimated renal function.  As per policy approved by the Pharmacy & Therapeutics and Medical Executive Committees, the antimicrobial dosage will be adjusted accordingly.  Current antimicrobial dosage:  Cefepime 1gm IV q8h  Indication: HCAP  Renal Function:  Estimated Creatinine Clearance: 27.7 mL/min (A) (by C-G formula based on SCr of 2.98 mg/dL (H)). []      On intermittent HD, scheduled: []      On CRRT    Antimicrobial dosage has been changed to:  Cefepime 1gm IV q24h  Additional comments:    Taeshaun Rames A. Jeanella CrazePierce, PharmD, BCPS Clinical Pharmacist [place] Pager: [phone] Please utilize Amion for appropriate phone number to reach the unit pharmacist [facility]   09/13/2018 4:01 PM

## 2018-09-13 NOTE — Progress Notes (Signed)
ANTICOAGULATION CONSULT NOTE   Pharmacy Consult for Coumadin Indication: LV thrombus  No Known Allergies  Patient Measurements: Height: 5\' 9"  (175.3 cm) Weight: 197 lb 12.8 oz (89.7 kg)(scale b) IBW/kg (Calculated) : 70.7  Vital Signs: Temp: 97.9 F (36.6 C) (09/16 1243) Temp Source: Oral (09/16 1243) BP: 127/92 (09/16 1243) Pulse Rate: 96 (09/16 1243)  Labs: Recent Labs    05-10-2018 2225 05-10-2018 2318 09/12/18 0253 09/13/18 1116  HGB 12.8*  --  13.1 12.7*  HCT 39.5  --  42.1 39.5  PLT 224  --  239 223  LABPROT  --  26.8*  --  36.7*  INR  --  2.50  --  3.74  CREATININE 3.02*  --  2.98* 2.98*  TROPONINI  --  0.43*  --   --     Estimated Creatinine Clearance: 27.7 mL/min (A) (by C-G formula based on SCr of 2.98 mg/dL (H)).   Medical History: Past Medical History:  Diagnosis Date  . Coronary artery disease   . Diabetes mellitus without complication (HCC)   . Hypertension     Medications:  No current facility-administered medications on file prior to encounter.    Current Outpatient Medications on File Prior to Encounter  Medication Sig Dispense Refill  . Apple Cid Vn-Grn Tea-Bit Or-Cr (APPLE CIDER VINEGAR PLUS) TABS Take 1 tablet by mouth daily.    Marland Kitchen. aspirin 81 MG EC tablet Take 1 tablet (81 mg total) by mouth daily. 30 tablet 6  . atorvastatin (LIPITOR) 40 MG tablet Take 1 tablet (40 mg total) by mouth daily at 6 PM. 30 tablet 6  . carvedilol (COREG) 6.25 MG tablet Take 0.5 tablets (3.125 mg total) by mouth 2 (two) times daily with a meal. 30 tablet 6  . docusate sodium (COLACE) 100 MG capsule Take 1 capsule (100 mg total) by mouth 2 (two) times daily as needed for mild constipation. 10 capsule 0  . glimepiride (AMARYL) 4 MG tablet Take 4 mg by mouth 2 (two) times daily.    Marland Kitchen. LORazepam (ATIVAN) 1 MG tablet Take 0.5-1 mg by mouth daily as needed for anxiety.    . Multiple Vitamin (MULTIVITAMIN WITH MINERALS) TABS tablet Take 1 tablet by mouth daily. 30 tablet 6   . Omega 3 340 MG CPDR Take 1 capsule by mouth at bedtime.    . ondansetron (ZOFRAN-ODT) 4 MG disintegrating tablet Take 1 tablet (4 mg total) by mouth every 8 (eight) hours as needed for nausea or vomiting. 20 tablet 0  . polyethylene glycol (MIRALAX / GLYCOLAX) packet Take 17 g by mouth daily as needed for moderate constipation. 14 each 0  . potassium chloride SA (K-DUR,KLOR-CON) 20 MEQ tablet Take 3 tablets (60 mEq total) by mouth daily. 90 tablet 6  . sertraline (ZOLOFT) 50 MG tablet Take 25 mg by mouth daily.  4  . torsemide (DEMADEX) 20 MG tablet Take 3 tablets (60 mg total) by mouth daily. 90 tablet 6  . traZODone (DESYREL) 50 MG tablet Take 50 mg by mouth at bedtime as needed for sleep.   0  . warfarin (COUMADIN) 2 MG tablet Take 1 tablet (2 mg total) by mouth daily. 30 tablet 11  . feeding supplement, GLUCERNA SHAKE, (GLUCERNA SHAKE) LIQD Take 237 mLs by mouth 2 (two) times daily between meals. (Patient not taking: Reported on 09/12/2018) 60 Can 0     Assessment: 62 y.o. male admitted with N/V/D, h/o LV thrombus, to continue Coumadin.  INR with quick jump today  to 3.7.  Unsure of reason, since he has had doses of Coumadin c/w home dose.  Perhaps related to GI issues/poor PO intake.  No overt bleeding or complications noted, CBC stable.  Goal of Therapy:  INR 2-3 Monitor platelets by anticoagulation protocol: Yes   Plan:  No Coumadin tonight. Daily PT/INR.  Jenetta Downer, [facility] Clinical Pharmacist Phone [phone]  09/13/2018 12:50 PM

## 2018-09-13 NOTE — Progress Notes (Addendum)
Advanced Heart Failure Rounding Note  PCP-Cardiologist: [name], MD   Subjective:    Seen in clinic 09-09-18 at which time he was c/o feeling "gassy" with nausea and vomiting. Labs looked OK except for K 3.1 and lipase slightly elevated at 65. Given zofran, but symptoms continued to worsen over the weekend prompting admission. Cr 2.27 -> 3.02 on admit.  Metformin held.   Feeling somewhat better today, but remains nauseated. Last vomitus at 0800. Poor appetite. Got all of his meds this am on empty stomach, and thinks otherwise he would've been OK this am. Remains slightly orthopneic.   Labs pending this am. Echo pending.   CT abd/pelvis 28-Sep-2018 1. Fluid within mildly distended stomach, may be related to p.o. intake. No evidence of gastric inflammation or wall thickening to suggest gastritis. 2. Mild hepatic steatosis. High-density gallbladder contents may be sludge or stones. No gallbladder inflammation. 3. Small to moderate right and small left pleural effusion in the included lung bases, similar to slightly increased from CT 1 month ago. 4.  Aortic Atherosclerosis (ICD10-I70.0).  Objective:    Weight Range: 89.7 kg Body mass index is 29.21 kg/m.   Vital Signs:   Temp:  [98 F (36.7 C)-98.4 F (36.9 C)] 98.4 F (36.9 C) (09/16 0432) Pulse Rate:  [89-93] 92 (09/16 0432) Resp:  [18-22] 20 (09/16 0432) BP: (104-113)/(78-88) 111/88 (09/16 0432) SpO2:  [89 %-96 %] 96 % (09/16 0432) Weight:  [89.7 kg] 89.7 kg (09/16 0432) Last BM Date: 09/12/18  Weight change: Filed Weights   09/28/2018 2219 09/12/18 0251 09/13/18 0432  Weight: 87.5 kg 89.1 kg 89.7 kg   Intake/Output:   Intake/Output Summary (Last 24 hours) at 09/13/2018 1035 Last data filed at 09/13/2018 0743 Gross per 24 hour  Intake 480 ml  Output 600 ml  Net -120 ml    Physical Exam    General:  Fatigued appearing. No resp difficulty HEENT: Normal Neck: Supple. JVP 7-8. Carotids 2+ bilat; no  bruits. No lymphadenopathy or thyromegaly appreciated. Cor: PMI nondisplaced. Regular rate & rhythm. No rubs, gallops or murmurs. Lungs: Diminished/dull basilar sounds R>L.  Abdomen: Soft, nontender, nondistended. No hepatosplenomegaly. No bruits or masses. Good bowel sounds. Extremities: No cyanosis, clubbing, or rash. Trace ankle edema.  Neuro: Alert & orientedx3, cranial nerves grossly intact. moves all 4 extremities w/o difficulty. Affect pleasant  Telemetry   NSR 90s, personally reviewed.   EKG    Sep 28, 2018 NSR 90 bpm, personally reviewed.   Labs    CBC Recent Labs    09/28/2018 2225 09/12/18 0253  WBC 12.4* 13.9*  HGB 12.8* 13.1  HCT 39.5 42.1  MCV 86.8 89.6  PLT 224 239   Basic Metabolic Panel Recent Labs    30/86/57 2225 09/12/18 0253  NA 134* 134*  K 4.4 4.5  CL 95* 97*  CO2 26 21*  GLUCOSE 199* 195*  BUN 34* 34*  CREATININE 3.02* 2.98*  CALCIUM 8.6* 8.8*   Liver Function Tests Recent Labs    09/12/18 0253  AST 35  ALT 23  ALKPHOS 65  BILITOT 1.3*  PROT 6.2*  ALBUMIN 3.3*   No results for input(s): LIPASE, AMYLASE in the last 72 hours. Cardiac Enzymes Recent Labs    2018-09-28 2318  TROPONINI 0.43*   BNP: BNP (last 3 results) Recent Labs    08/15/18 1649 08/20/18 1258 09/28/2018 2318  BNP 930.7* 1,184.0* 1,989.1*   ProBNP (last 3 results) No results for input(s): PROBNP in the last 8760  hours.  D-Dimer No results for input(s): DDIMER in the last 72 hours. Hemoglobin A1C No results for input(s): HGBA1C in the last 72 hours. Fasting Lipid Panel No results for input(s): CHOL, HDL, LDLCALC, TRIG, CHOLHDL, LDLDIRECT in the last 72 hours. Thyroid Function Tests No results for input(s): TSH, T4TOTAL, T3FREE, THYROIDAB in the last 72 hours.  Invalid input(s): FREET3  Other results:  Imaging   Dg Abd Portable 1v  Result Date: 09/13/2018 CLINICAL DATA:  Nausea, vomiting. EXAM: PORTABLE ABDOMEN - 1 VIEW COMPARISON:  Radiographs of  September 09, 2018. FINDINGS: The bowel gas pattern is normal. No radio-opaque calculi or other significant radiographic abnormality are seen. IMPRESSION: No evidence of bowel obstruction or ileus. Electronically Signed   By: Lupita Raider, M.D.   On: 09/13/2018 09:13     Medications:    Scheduled Medications: . aspirin EC  81 mg Oral Daily  . atorvastatin  40 mg Oral q1800  . carvedilol  3.125 mg Oral BID WC  . glimepiride  4 mg Oral BID WC  . insulin aspart  0-5 Units Subcutaneous QHS  . insulin aspart  0-9 Units Subcutaneous TID WC  . multivitamin with minerals  1 tablet Oral Daily  . sertraline  50 mg Oral Daily  . warfarin  2 mg Oral q1800  . Warfarin - Pharmacist Dosing Inpatient   Does not apply q1800     Infusions:   PRN Medications:  acetaminophen **OR** acetaminophen, bisacodyl, docusate sodium, LORazepam, ondansetron (ZOFRAN) IV, traMADol, traZODone  Patient Profile   Jose Myers is a 62 y.o. 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  Admitted with abdominal pain, N/V, and AKI.   Assessment/Plan   1. Severe n/v, abdominal pain - Unclear etiology. - Work up so far unremarkable, but seems to be improving off of amio and metformin.  - Continue symptomatic treatment.  - Imaging relatively unremarkable as above.   2.Chronicchronic systolic CHF: -Ischemic cardiomyopathy,Echo 8/23/19EF 25-30% by echo. - Volume status looks OK. Would continue to hold torsemide until po intake improves.  - BMET pending.  - Torsemide on hold with AKI.  - Continue coreg 3.125 mg BID for now.  -ACE/ARB/spironolactone/digoxin not on board with elevated creatinine.  3. Severe multivessel CAD: -Not thought to be CABG candidate.Interventional cardiology reviewed films at length 08/24/18 and felt not to be candidate for PCI.  - No s/s of ischemia.    - Trop on admit trended  down from last check.  - Continue ASA/statin. - Also on coumadin for LV thrombus and ?CVA.  4. LV Thrombus: - INR 2.50. Dosing per pharm-D.   4. CKD III-IV:  - Baseline Cr appears to be 2.4 - 2.6 - Up to 3.0 on admission - Labs pending today.   5. S/p VT arrest 08/20/18: -Now s/p ICD 08/31/18.Stable.   6. DM2: - Per primary.  - Off metformin due to GI side effect - Jardiance may be good option.   Medication concerns reviewed with patient and pharmacy team. Barriers identified: None at this time.   Length of Stay: 0  Jose Myers  09/13/2018, 10:35 AM  Advanced Heart Failure Team Pager [phone] (M-F; 7a - 4p)  Please contact [facility] for night-coverage after hours (4p -7a ) and weekends on [email or web address]  Patient seen with PA, agree with the above note.  Nausea better this afternoon off metformin and amiodarone.  Torsemide on  hold with AKI on CKD stage 3.  Will continue to follow, reassess in am.   Jose Myers 09/13/2018

## 2018-09-13 NOTE — Progress Notes (Addendum)
Pharmacy Antibiotic Note  Jose Myers is a 62 y.o. male  with pneumonia.  Pharmacy has been consulted for vancomycin dosing (he is currently on cefepime). -SCr= 2.98 (baseline ~ 2.5), CrCl ~ 30, WBC= 14.7, afeb  Plan: -Vancomycin 1750mg  IV x1 followed by 1000mg  IV q24h -Will follow renal function, cultures and clinical progress    Height: 5\' 9"  (175.3 cm) Weight: 197 lb 12.8 oz (89.7 kg)(scale b) IBW/kg (Calculated) : 70.7  Temp (24hrs), Avg:98.1 F (36.7 C), Min:97.9 F (36.6 C), Max:98.4 F (36.9 C)  Recent Labs  Lab 09/09/18 1433 2018/09/09 2225 09/12/18 0057 09/12/18 0214 09/12/18 0253 09/13/18 1116  WBC 10.1 12.4*  --   --  13.9* 14.7*  CREATININE 2.27* 3.02*  --   --  2.98* 2.98*  LATICACIDVEN  --   --  2.36* 1.69  --   --     Estimated Creatinine Clearance: 27.7 mL/min (A) (by C-G formula based on SCr of 2.98 mg/dL (H)).    No Known Allergies    Thank you for allowing pharmacy to be a part of this patient's care.  Harland GermanAndrew Beck Cofer, PharmD Clinical Pharmacist Please check Amion for pharmacy contact number

## 2018-09-13 NOTE — Progress Notes (Addendum)
Progress Note    Jose Myers  ZOX:[id number] DOB: 1956-07-19  DOA: 10/06/18 PCP: Daisy Floro, MD    Brief Narrative:     Medical records reviewed and are as summarized below:  Jose Myers is an 62 y.o. male  w Hypertension, CAD (NSTEMI 08/15/2018) w h/o VT arrest 08/20/2018 which required CPR, 2 chocks, epi and intubation, post- arrest echo showed L apical thrombone resolved, EF 25%.  , Dm2  CKD stage 4, apparently c/o n/v, diarrhea 4+ times per day for the past 2 days.  Pt denies fever, chills, cough, cp, palp, sob, abd pain, brbpr, dysuria, hematuria.  Pt presented due to the n/v, diarrhea.  Denies recent travel or sick contacts.  Pt states that Dr. Clarise Cruz told him to stop his Amiodarone. This has not made a significant difference. Pt has tried oral medication for nausea and vomitting and diarrhea without success.   Assessment/Plan:   Principal Problem:   Adjustment disorder with anxiety Active Problems:   Acute renal failure (ARF) (HCC)   Elevated troponin   Type 2 diabetes mellitus with renal manifestations (HCC)   Diarrhea   Nausea & vomiting   Chronic post-traumatic stress disorder (PTSD)  N/V zofran 4mg  iv q6h prn  STOP METFORMIN -still having  Cough/leuocytosis/pleural effusion -? PNA- would he health care associated -add abx -not sure enough fluid to do thoracentesis-- will continue to monitor (got CPR last hospitalization) -check procalcitonin -limited echo stable to prior  Diarrhea -resolved STOP METFORMIN -now actually constipated  CKD- stage III -defer diuretic medications to cardiology  CHF (EF 25%), chronic systolic CHF CAD s/p NSTEMI H/o LV Thrombus Cardiology consulted by ED, appreciate input -coumadin  Dm2 Cont Amaryl fsbs ac and qhs, ISS -should not have metformin -diabetic coordinator consult  Anxiety Cont Zoloft, if diarrhea is persistent then consider stopping this as can cause GI upset Cont  Ativan Cont Trazodone -seen by psych-- zofolt adjusted  Family Communication/Anticipated D/C date and plan/Code Status   DVT prophylaxis: coumadin Code Status: Full Code.  Family Communication: wife at bedside Disposition Plan: pending   Medical Consultants:   cards   Anti-Infectives:     Subjective:   This AM had retching-- this PM still no BM but still not feeling well-- cough/SOB/pain in back  Objective:    Vitals:   09/12/18 1957 09/13/18 0030 09/13/18 0432 09/13/18 1243  BP: 105/84 104/80 111/88 (!) 127/92  Pulse: 89 90 92 96  Resp: 18 20 20  (!) 22  Temp: 98.1 F (36.7 C) 98 F (36.7 C) 98.4 F (36.9 C) 97.9 F (36.6 C)  TempSrc: Oral Oral Oral Oral  SpO2: 96% 93% 96% 91%  Weight:   89.7 kg   Height:        Intake/Output Summary (Last 24 hours) at 09/13/2018 1618 Last data filed at 09/13/2018 1544 Gross per 24 hour  Intake 480 ml  Output 600 ml  Net -120 ml   Filed Weights   October 06, 2018 2219 09/12/18 0251 09/13/18 0432  Weight: 87.5 kg 89.1 kg 89.7 kg    Exam: Uncomfortable appearing Diminished as bases, rales in LUL +BS, soft, NT Min LE edema No rashes or lesions  Data Reviewed:   I have personally reviewed following labs and imaging studies:  Labs: Labs show the following:   Basic Metabolic Panel: Recent Labs  Lab 09/09/18 1433 10-06-2018 2225 09/12/18 0253 09/13/18 1116  NA 137 134* 134* 133*  K 3.1* 4.4 4.5 4.8  CL  98 95* 97* 94*  CO2 27 26 21* 25  GLUCOSE 151* 199* 195* 212*  BUN 23 34* 34* 39*  CREATININE 2.27* 3.02* 2.98* 2.98*  CALCIUM 8.9 8.6* 8.8* 8.8*   GFR Estimated Creatinine Clearance: 27.7 mL/min (A) (by C-G formula based on SCr of 2.98 mg/dL (H)). Liver Function Tests: Recent Labs  Lab 09/09/18 1433 09/12/18 0253  AST 26 35  ALT 17 23  ALKPHOS 66 65  BILITOT 1.2 1.3*  PROT 6.2* 6.2*  ALBUMIN 3.0* 3.3*   Recent Labs  Lab 09/09/18 1433  LIPASE 65*  AMYLASE 51   No results for input(s): AMMONIA in the  last 168 hours. Coagulation profile Recent Labs  Lab 09/09/18 1433 09/10/2018 2318 09/13/18 1116  INR 2.00 2.50 3.74    CBC: Recent Labs  Lab 09/09/18 1433 09/20/2018 2225 09/12/18 0253 09/13/18 1116  WBC 10.1 12.4* 13.9* 14.7*  HGB 12.9* 12.8* 13.1 12.7*  HCT 40.2 39.5 42.1 39.5  MCV 86.5 86.8 89.6 86.6  PLT 250 224 239 223   Cardiac Enzymes: Recent Labs  Lab 09/17/2018 2318  TROPONINI 0.43*   BNP (last 3 results) No results for input(s): PROBNP in the last 8760 hours. CBG: Recent Labs  Lab 09/12/18 1237 09/12/18 1619 09/12/18 2115 09/13/18 0733 09/13/18 1236  GLUCAP 226* 172* 171* 147* 207*   D-Dimer: No results for input(s): DDIMER in the last 72 hours. Hgb A1c: No results for input(s): HGBA1C in the last 72 hours. Lipid Profile: No results for input(s): CHOL, HDL, LDLCALC, TRIG, CHOLHDL, LDLDIRECT in the last 72 hours. Thyroid function studies: No results for input(s): TSH, T4TOTAL, T3FREE, THYROIDAB in the last 72 hours.  Invalid input(s): FREET3 Anemia work up: No results for input(s): VITAMINB12, FOLATE, FERRITIN, TIBC, IRON, RETICCTPCT in the last 72 hours. Sepsis Labs: Recent Labs  Lab 09/09/18 1433 08/31/2018 2225 09/12/18 0057 09/12/18 0214 09/12/18 0253 09/13/18 1116  WBC 10.1 12.4*  --   --  13.9* 14.7*  LATICACIDVEN  --   --  2.36* 1.69  --   --     Microbiology No results found for this or any previous visit (from the past 240 hour(s)).  Procedures and diagnostic studies:  Ct Abdomen Pelvis Wo Contrast  Result Date: 09/12/2018 CLINICAL DATA:  Chest tightness. Abd pain, gastroenteritis or colitis suspected EXAM: CT ABDOMEN AND PELVIS WITHOUT CONTRAST TECHNIQUE: Multidetector CT imaging of the abdomen and pelvis was performed following the standard protocol without IV contrast. COMPARISON:  Abdominal CT 08/15/2018 FINDINGS: Lower chest: Small to moderate right and small left pleural effusion with adjacent atelectasis, similar or slightly  increased from prior CT. There are coronary artery calcifications. Hepatobiliary: Diffusely decreased hepatic density consistent with steatosis. Punctate hepatic granuloma. High-density gallbladder contents may represent sludge or stones, no pericholecystic inflammation. No biliary dilatation. No ductal dilatation or inflammation. Pancreas: No ductal dilatation or inflammation. Spleen: Normal in size without focal abnormality. Adrenals/Urinary Tract: Normal adrenal glands. No hydronephrosis or perinephric edema. No urolithiasis. Urinary bladder is partially distended and unremarkable. Stomach/Bowel: Bowel evaluation is limited in the absence of enteric and IV contrast. Mild gastric distention with fluid/ingested contents. No small bowel dilatation to suggest obstruction. No evidence of wall thickening or inflammation. Normal appendix. No evidence of colonic inflammation, descending and sigmoid colon are decompressed. Vascular/Lymphatic: Aorta bi-iliac atherosclerosis, mild. Small upper abdominal or retroperitoneal nodes not enlarged by size criteria. Reproductive: Prostate is unremarkable. Other: Fat in both inguinal canals. No abdominal ascites. No free air. Musculoskeletal: There are  no acute or suspicious osseous abnormalities. Degenerative change in the spine. IMPRESSION: 1. Fluid within mildly distended stomach, may be related to p.o. intake. No evidence of gastric inflammation or wall thickening to suggest gastritis. 2. Mild hepatic steatosis. High-density gallbladder contents may be sludge or stones. No gallbladder inflammation. 3. Small to moderate right and small left pleural effusion in the included lung bases, similar to slightly increased from CT 1 month ago. 4.  Aortic Atherosclerosis (ICD10-I70.0). Electronically Signed   By: Narda Rutherford M.D.   On: 09/12/2018 00:21   Dg Chest 2 View  Result Date: 09/17/2018 CLINICAL DATA:  LEFT rib pain after CPR 1 week ago. EXAM: CHEST - 2 VIEW COMPARISON:   Chest radiograph September 04, 2018 FINDINGS: Cardiac silhouette is mildly enlarged unchanged. Similar interstitial prominence with small bilateral pleural effusions, improved on the RIGHT. Persistent though improved bibasilar airspace opacities. No pneumothorax. Single lead LEFT AICD. Osseous structures are non suspicious. IMPRESSION: 1. Small residual pleural effusion, improved on the RIGHT. Residual bibasilar airspace opacities. 2. Mild cardiomegaly. 3. Mild chronic interstitial changes. Electronically Signed   By: Awilda Metro M.D.   On: 09/19/2018 22:54   Dg Abd Portable 1v  Result Date: 09/13/2018 CLINICAL DATA:  Nausea, vomiting. EXAM: PORTABLE ABDOMEN - 1 VIEW COMPARISON:  Radiographs of September 09, 2018. FINDINGS: The bowel gas pattern is normal. No radio-opaque calculi or other significant radiographic abnormality are seen. IMPRESSION: No evidence of bowel obstruction or ileus. Electronically Signed   By: Lupita Raider, M.D.   On: 09/13/2018 09:13    Medications:   . aspirin EC  81 mg Oral Daily  . atorvastatin  40 mg Oral q1800  . carvedilol  3.125 mg Oral BID WC  . glimepiride  4 mg Oral BID WC  . insulin aspart  0-5 Units Subcutaneous QHS  . insulin aspart  0-9 Units Subcutaneous TID WC  . multivitamin with minerals  1 tablet Oral Daily  . sertraline  50 mg Oral Daily  . Warfarin - Pharmacist Dosing Inpatient   Does not apply q1800   Continuous Infusions: . ceFEPime (MAXIPIME) IV       LOS: 0 days   Joseph Art  [facility]   *Please refer to [email or web address], password [email or web address] to get updated schedule on who will round on this patient, as hospitalists switch teams weekly. If 7PM-7AM, please contact night-coverage at [email or web address], password [email or web address] for any overnight needs.  09/13/2018, 4:18 PM

## 2018-09-13 NOTE — Plan of Care (Signed)
°  Problem: Activity: °Goal: Risk for activity intolerance will decrease °Outcome: Progressing °  °Problem: Coping: °Goal: Level of anxiety will decrease °Outcome: Progressing °  °Problem: Safety: °Goal: Ability to remain free from injury will improve °Outcome: Progressing °  °Problem: Activity: °Goal: Capacity to carry out activities will improve °Outcome: Progressing °  °

## 2018-09-14 ENCOUNTER — Observation Stay (HOSPITAL_COMMUNITY): Payer: BLUE CROSS/BLUE SHIELD

## 2018-09-14 DIAGNOSIS — I700 Atherosclerosis of aorta: Secondary | ICD-10-CM | POA: Diagnosis present

## 2018-09-14 DIAGNOSIS — D689 Coagulation defect, unspecified: Secondary | ICD-10-CM | POA: Diagnosis present

## 2018-09-14 DIAGNOSIS — R570 Cardiogenic shock: Secondary | ICD-10-CM | POA: Diagnosis not present

## 2018-09-14 DIAGNOSIS — I5023 Acute on chronic systolic (congestive) heart failure: Secondary | ICD-10-CM | POA: Diagnosis present

## 2018-09-14 DIAGNOSIS — I214 Non-ST elevation (NSTEMI) myocardial infarction: Secondary | ICD-10-CM | POA: Diagnosis present

## 2018-09-14 DIAGNOSIS — J9621 Acute and chronic respiratory failure with hypoxia: Secondary | ICD-10-CM | POA: Diagnosis present

## 2018-09-14 DIAGNOSIS — R197 Diarrhea, unspecified: Secondary | ICD-10-CM | POA: Diagnosis not present

## 2018-09-14 DIAGNOSIS — F4312 Post-traumatic stress disorder, chronic: Secondary | ICD-10-CM | POA: Diagnosis present

## 2018-09-14 DIAGNOSIS — N179 Acute kidney failure, unspecified: Secondary | ICD-10-CM | POA: Diagnosis present

## 2018-09-14 DIAGNOSIS — E876 Hypokalemia: Secondary | ICD-10-CM | POA: Diagnosis present

## 2018-09-14 DIAGNOSIS — K760 Fatty (change of) liver, not elsewhere classified: Secondary | ICD-10-CM | POA: Diagnosis present

## 2018-09-14 DIAGNOSIS — E1122 Type 2 diabetes mellitus with diabetic chronic kidney disease: Secondary | ICD-10-CM | POA: Diagnosis present

## 2018-09-14 DIAGNOSIS — E785 Hyperlipidemia, unspecified: Secondary | ICD-10-CM | POA: Diagnosis present

## 2018-09-14 DIAGNOSIS — I130 Hypertensive heart and chronic kidney disease with heart failure and stage 1 through stage 4 chronic kidney disease, or unspecified chronic kidney disease: Secondary | ICD-10-CM | POA: Diagnosis present

## 2018-09-14 DIAGNOSIS — I255 Ischemic cardiomyopathy: Secondary | ICD-10-CM | POA: Diagnosis present

## 2018-09-14 DIAGNOSIS — N184 Chronic kidney disease, stage 4 (severe): Secondary | ICD-10-CM | POA: Diagnosis present

## 2018-09-14 DIAGNOSIS — K5900 Constipation, unspecified: Secondary | ICD-10-CM | POA: Diagnosis present

## 2018-09-14 DIAGNOSIS — I472 Ventricular tachycardia: Secondary | ICD-10-CM | POA: Diagnosis present

## 2018-09-14 DIAGNOSIS — R112 Nausea with vomiting, unspecified: Secondary | ICD-10-CM | POA: Diagnosis not present

## 2018-09-14 DIAGNOSIS — Z833 Family history of diabetes mellitus: Secondary | ICD-10-CM | POA: Diagnosis not present

## 2018-09-14 DIAGNOSIS — E872 Acidosis: Secondary | ICD-10-CM | POA: Diagnosis present

## 2018-09-14 DIAGNOSIS — J90 Pleural effusion, not elsewhere classified: Secondary | ICD-10-CM | POA: Diagnosis not present

## 2018-09-14 DIAGNOSIS — Z808 Family history of malignant neoplasm of other organs or systems: Secondary | ICD-10-CM | POA: Diagnosis not present

## 2018-09-14 DIAGNOSIS — I2510 Atherosclerotic heart disease of native coronary artery without angina pectoris: Secondary | ICD-10-CM | POA: Diagnosis present

## 2018-09-14 DIAGNOSIS — J9601 Acute respiratory failure with hypoxia: Secondary | ICD-10-CM | POA: Diagnosis not present

## 2018-09-14 DIAGNOSIS — N170 Acute kidney failure with tubular necrosis: Secondary | ICD-10-CM | POA: Diagnosis not present

## 2018-09-14 DIAGNOSIS — J189 Pneumonia, unspecified organism: Secondary | ICD-10-CM | POA: Diagnosis present

## 2018-09-14 DIAGNOSIS — Z79899 Other long term (current) drug therapy: Secondary | ICD-10-CM | POA: Diagnosis not present

## 2018-09-14 DIAGNOSIS — F4322 Adjustment disorder with anxiety: Secondary | ICD-10-CM | POA: Diagnosis present

## 2018-09-14 LAB — BASIC METABOLIC PANEL
ANION GAP: 13 (ref 5–15)
BUN: 43 mg/dL — ABNORMAL HIGH (ref 8–23)
CALCIUM: 8.9 mg/dL (ref 8.9–10.3)
CO2: 25 mmol/L (ref 22–32)
Chloride: 95 mmol/L — ABNORMAL LOW (ref 98–111)
Creatinine, Ser: 2.83 mg/dL — ABNORMAL HIGH (ref 0.61–1.24)
GFR calc Af Amer: 26 mL/min — ABNORMAL LOW (ref >60)
GFR, EST NON AFRICAN AMERICAN: 22 mL/min — AB (ref >60)
GLUCOSE: 176 mg/dL — AB (ref 70–99)
Potassium: 4.4 mmol/L (ref 3.5–5.1)
Sodium: 133 mmol/L — ABNORMAL LOW (ref 135–145)

## 2018-09-14 LAB — CBC
HCT: 38.4 % — ABNORMAL LOW (ref 39.0–52.0)
HEMOGLOBIN: 12.3 g/dL — AB (ref 13.0–17.0)
MCH: 27.7 pg (ref 26.0–34.0)
MCHC: 32 g/dL (ref 30.0–36.0)
MCV: 86.5 fL (ref 78.0–100.0)
PLATELETS: 222 10*3/uL (ref 150–400)
RBC: 4.44 MIL/uL (ref 4.22–5.81)
RDW: 14.9 % (ref 11.5–15.5)
WBC: 11.8 10*3/uL — ABNORMAL HIGH (ref 4.0–10.5)

## 2018-09-14 LAB — GLUCOSE, CAPILLARY
Glucose-Capillary: 162 mg/dL — ABNORMAL HIGH (ref 70–99)
Glucose-Capillary: 187 mg/dL — ABNORMAL HIGH (ref 70–99)
Glucose-Capillary: 310 mg/dL — ABNORMAL HIGH (ref 70–99)
Glucose-Capillary: 213 mg/dL — ABNORMAL HIGH (ref 70–99)

## 2018-09-14 LAB — PROTIME-INR
INR: 5.94
PROTHROMBIN TIME: 52.7 s — AB (ref 11.4–15.2)

## 2018-09-14 MED ORDER — TRAMADOL HCL 50 MG PO TABS
50.0000 mg | ORAL_TABLET | Freq: Four times a day (QID) | ORAL | Status: DC | PRN
  Administered 2018-09-14 – 2018-09-17 (×6): 50 mg via ORAL
  Filled 2018-09-14 (×6): qty 1

## 2018-09-14 MED ORDER — TORSEMIDE 20 MG PO TABS: 60.0000 mg | ORAL_TABLET | Freq: Every day | ORAL | Status: DC

## 2018-09-14 MED ORDER — TORSEMIDE 20 MG PO TABS
60.0000 mg | ORAL_TABLET | Freq: Every day | ORAL | Status: DC
  Administered 2018-09-14: 60 mg via ORAL
  Filled 2018-09-14 (×2): qty 3

## 2018-09-14 NOTE — Progress Notes (Signed)
PT. With critical INR of 5.94. Dr. Benjamine MolaVann paged to make aware. On coming RN informed of results.

## 2018-09-14 NOTE — Progress Notes (Signed)
Progress Note    Jose Myers  ZOX:[id number] DOB: June 14, 1956  DOA: 09/13/2018 PCP: Daisy Floro, MD    Brief Narrative:     Medical records reviewed and are as summarized below:  Jose Myers is an 62 y.o. male  w Hypertension, CAD (NSTEMI 08/15/2018) w h/o VT arrest 08/20/2018 which required CPR, 2 chocks, epi and intubation, post- arrest echo showed L apical thrombone resolved, EF 25%.  , Dm2  CKD stage 4, apparently c/o n/v, diarrhea 4+ times per day for the past 2 days.  Pt denies fever, chills, cough, cp, palp, sob, abd pain, brbpr, dysuria, hematuria.  Pt presented due to the n/v, diarrhea.  Denies recent travel or sick contacts.  Pt states that Dr. Clarise Cruz told him to stop his Amiodarone. This has not made a significant difference. Pt has tried oral medication for nausea and vomitting and diarrhea without success.   Assessment/Plan:   Principal Problem:   Adjustment disorder with anxiety Active Problems:   Acute renal failure (ARF) (HCC)   Elevated troponin   Type 2 diabetes mellitus with renal manifestations (HCC)   Diarrhea   Nausea & vomiting   Chronic post-traumatic stress disorder (PTSD)  N/V zofran 4mg  iv q6h prn  STOP METFORMIN -nausea improved after IV abx for pneumonia- in still improving in AM would de-escalate abx  Cough/leuocytosis/pleural effusion-- HCAPNA -IV abx -defer thoracentesis vs pleurex catheter to the CHF team (x ray with increased effusions) -limited echo stable compared to prior -leukocytosis decreasing after abx  Diarrhea -resolved STOP METFORMIN -now actually constipated -miralax/apple juice and prune juice  CKD- stage III -defer diuretic medications to cardiology  CHF (EF 25%), chronic systolic CHF CAD s/p NSTEMI H/o LV Thrombus Cardiology consulted by ED, appreciate input -coumadin (pharmacy to manage)  Dm2 Cont Amaryl fsbs ac and qhs, ISS -should not have metformin -diabetic coordinator  consult  Anxiety Cont Ativan Cont Trazodone -seen by psych-- zofolt adjusted  Family Communication/Anticipated D/C date and plan/Code Status   DVT prophylaxis: coumadin Code Status: Full Code.  Family Communication: wife at bedside Disposition Plan: pending   Medical Consultants:   cards    Subjective:   Feeling much better today but still has back pain-- thinks from the fluid in his lungs  Objective:    Vitals:   09/13/18 1931 09/14/18 0527 09/14/18 0935 09/14/18 1129  BP: 108/78 102/78 101/82 (!) 86/72  Pulse: 94 93 100 93  Resp: 16 16  17   Temp: 97.7 F (36.5 C) 98 F (36.7 C) 97.7 F (36.5 C) 98.3 F (36.8 C)  TempSrc: Oral Oral Oral Oral  SpO2: 92% 93% 98% 99%  Weight:  88.9 kg    Height:        Intake/Output Summary (Last 24 hours) at 09/14/2018 1340 Last data filed at 09/14/2018 0900 Gross per 24 hour  Intake 340 ml  Output 400 ml  Net -60 ml   Filed Weights   09/12/18 0251 09/13/18 0432 09/14/18 0527  Weight: 89.1 kg 89.7 kg 88.9 kg    Exam: In a chair Not on O2 No increased work of breathing, crackles at bases +BS, soft rrr No LE edema  Data Reviewed:   I have personally reviewed following labs and imaging studies:  Labs: Labs show the following:   Basic Metabolic Panel: Recent Labs  Lab 09/09/18 1433 09/08/2018 2225 09/12/18 0253 09/13/18 1116 09/14/18 0545  NA 137 134* 134* 133* 133*  K 3.1* 4.4 4.5 4.8 4.4  CL 98 95* 97* 94* 95*  CO2 27 26 21* 25 25  GLUCOSE 151* 199* 195* 212* 176*  BUN 23 34* 34* 39* 43*  CREATININE 2.27* 3.02* 2.98* 2.98* 2.83*  CALCIUM 8.9 8.6* 8.8* 8.8* 8.9   GFR Estimated Creatinine Clearance: 29.1 mL/min (A) (by C-G formula based on SCr of 2.83 mg/dL (H)). Liver Function Tests: Recent Labs  Lab 09/09/18 1433 09/12/18 0253  AST 26 35  ALT 17 23  ALKPHOS 66 65  BILITOT 1.2 1.3*  PROT 6.2* 6.2*  ALBUMIN 3.0* 3.3*   Recent Labs  Lab 09/09/18 1433  LIPASE 65*  AMYLASE 51   No  results for input(s): AMMONIA in the last 168 hours. Coagulation profile Recent Labs  Lab 09/09/18 1433 09/19/2018 2318 09/13/18 1116 09/14/18 0545  INR 2.00 2.50 3.74 5.94*    CBC: Recent Labs  Lab 09/09/18 1433 09/21/2018 2225 09/12/18 0253 09/13/18 1116 09/14/18 0545  WBC 10.1 12.4* 13.9* 14.7* 11.8*  HGB 12.9* 12.8* 13.1 12.7* 12.3*  HCT 40.2 39.5 42.1 39.5 38.4*  MCV 86.5 86.8 89.6 86.6 86.5  PLT 250 224 239 223 222   Cardiac Enzymes: Recent Labs  Lab 09/14/2018 2318  TROPONINI 0.43*   BNP (last 3 results) No results for input(s): PROBNP in the last 8760 hours. CBG: Recent Labs  Lab 09/13/18 1236 09/13/18 1620 09/13/18 2106 09/14/18 0816 09/14/18 1128  GLUCAP 207* 211* 167* 162* 187*   D-Dimer: No results for input(s): DDIMER in the last 72 hours. Hgb A1c: No results for input(s): HGBA1C in the last 72 hours. Lipid Profile: No results for input(s): CHOL, HDL, LDLCALC, TRIG, CHOLHDL, LDLDIRECT in the last 72 hours. Thyroid function studies: No results for input(s): TSH, T4TOTAL, T3FREE, THYROIDAB in the last 72 hours.  Invalid input(s): FREET3 Anemia work up: No results for input(s): VITAMINB12, FOLATE, FERRITIN, TIBC, IRON, RETICCTPCT in the last 72 hours. Sepsis Labs: Recent Labs  Lab 09/04/2018 2225 09/12/18 0057 09/12/18 0214 09/12/18 0253 09/13/18 1116 09/14/18 0545  PROCALCITON  --   --   --   --  <0.10  --   WBC 12.4*  --   --  13.9* 14.7* 11.8*  LATICACIDVEN  --  2.36* 1.69  --   --   --     Microbiology No results found for this or any previous visit (from the past 240 hour(s)).  Procedures and diagnostic studies:  Dg Chest Port 1 View  Result Date: 09/14/2018 CLINICAL DATA:  Pleural effusion, bilateral EXAM: PORTABLE CHEST 1 VIEW COMPARISON:  Three days ago FINDINGS: Increased appearance of bilateral pleural effusion that is likely moderate. There is still a degree of vascular congestion/interstitial edema. Pulmonary opacities at the  bases that is likely atelectasis. No pneumothorax. Chronic cardiomegaly with single chamber ICD/pacer. IMPRESSION: 1. Symmetric, increasing moderate pleural effusions and lower lobe opacification. 2. Vascular congestion/edema. Electronically Signed   By: Marnee SpringJonathon  Watts M.D.   On: 09/14/2018 09:17   Dg Abd Portable 1v  Result Date: 09/13/2018 CLINICAL DATA:  Nausea, vomiting. EXAM: PORTABLE ABDOMEN - 1 VIEW COMPARISON:  Radiographs of September 09, 2018. FINDINGS: The bowel gas pattern is normal. No radio-opaque calculi or other significant radiographic abnormality are seen. IMPRESSION: No evidence of bowel obstruction or ileus. Electronically Signed   By: Lupita RaiderJames  Green Jr, M.D.   On: 09/13/2018 09:13    Medications:   . aspirin EC  81 mg Oral Daily  . atorvastatin  40 mg Oral q1800  . carvedilol  3.125  mg Oral BID WC  . glimepiride  4 mg Oral BID WC  . insulin aspart  0-5 Units Subcutaneous QHS  . insulin aspart  0-9 Units Subcutaneous TID WC  . multivitamin with minerals  1 tablet Oral Daily  . sertraline  50 mg Oral Daily  . torsemide  60 mg Oral Daily  . Warfarin - Pharmacist Dosing Inpatient   Does not apply q1800   Continuous Infusions: . ceFEPime (MAXIPIME) IV 1 g (09/13/18 1726)  . vancomycin       LOS: 0 days   Joseph Art  [facility]   *Please refer to [email or web address], password [email or web address] to get updated schedule on who will round on this patient, as hospitalists switch teams weekly. If 7PM-7AM, please contact night-coverage at [email or web address], password [email or web address] for any overnight needs.  09/14/2018, 1:40 PM

## 2018-09-14 NOTE — Progress Notes (Addendum)
Advanced Heart Failure Rounding Note  PCP-Cardiologist: [name], MD   Subjective:    Feeling much better this am. No further vomiting. Appetite improving. Mild peripheral edema. Having mild mid-back pain consistent with his previous pleural effusion pain. + flatus, no BM yet. Denies dizziness or lightheadedness.   Echo 09/12/18 - LVEF 20-25%, Severe MR, Mild Mod TI, Trivial PI, PA peak pressure 60 mm Hg  CT abd/pelvis 09/04/2018 1. Fluid within mildly distended stomach, may be related to p.o. intake. No evidence of gastric inflammation or wall thickening to suggest gastritis. 2. Mild hepatic steatosis. High-density gallbladder contents may be sludge or stones. No gallbladder inflammation. 3. Small to moderate right and small left pleural effusion in the included lung bases, similar to slightly increased from CT 1 month ago. 4.  Aortic Atherosclerosis (ICD10-I70.0).  Objective:    Weight Range: 88.9 kg Body mass index is 28.94 kg/m.   Vital Signs:   Temp:  [97.7 F (36.5 C)-98.7 F (37.1 C)] 98 F (36.7 C) (09/17 0527) Pulse Rate:  [93-96] 93 (09/17 0527) Resp:  [16-22] 16 (09/17 0527) BP: (102-127)/(78-92) 102/78 (09/17 0527) SpO2:  [91 %-98 %] 93 % (09/17 0527) Weight:  [88.9 kg] 88.9 kg (09/17 0527) Last BM Date: 09/02/2018  Weight change: Filed Weights   09/12/18 0251 09/13/18 0432 09/14/18 0527  Weight: 89.1 kg 89.7 kg 88.9 kg   Intake/Output:   Intake/Output Summary (Last 24 hours) at 09/14/2018 0830 Last data filed at 09/14/2018 0600 Gross per 24 hour  Intake 340 ml  Output 100 ml  Net 240 ml    Physical Exam    General: Well appearing. No resp difficulty. HEENT: Normal Neck: Supple. JVP 7-8 cm. Carotids 2+ bilat; no bruits. No thyromegaly or nodule noted. Cor: PMI nondisplaced. RRR, No M/G/R noted Lungs: Diminished/dull basilar sounds. R>L.  Abdomen: Soft, non-tender, non-distended, no HSM. No bruits or masses. +BS  Extremities: No  cyanosis, clubbing, or rash. Trace ankle edema.   Neuro: Alert & orientedx3, cranial nerves grossly intact. moves all 4 extremities w/o difficulty. Affect pleasant   Telemetry   NSR 90s, personally reviewed.   EKG    No new tracings.    Labs    CBC Recent Labs    09/13/18 1116 09/14/18 0545  WBC 14.7* 11.8*  HGB 12.7* 12.3*  HCT 39.5 38.4*  MCV 86.6 86.5  PLT 223 222   Basic Metabolic Panel Recent Labs    16/10/96 1116 09/14/18 0545  NA 133* 133*  K 4.8 4.4  CL 94* 95*  CO2 25 25  GLUCOSE 212* 176*  BUN 39* 43*  CREATININE 2.98* 2.83*  CALCIUM 8.8* 8.9   Liver Function Tests Recent Labs    09/12/18 0253  AST 35  ALT 23  ALKPHOS 65  BILITOT 1.3*  PROT 6.2*  ALBUMIN 3.3*   No results for input(s): LIPASE, AMYLASE in the last 72 hours. Cardiac Enzymes Recent Labs    09/08/2018 2318  TROPONINI 0.43*   BNP: BNP (last 3 results) Recent Labs    08/15/18 1649 08/20/18 1258 09/03/2018 2318  BNP 930.7* 1,184.0* 1,989.1*   ProBNP (last 3 results) No results for input(s): PROBNP in the last 8760 hours.  D-Dimer No results for input(s): DDIMER in the last 72 hours. Hemoglobin A1C No results for input(s): HGBA1C in the last 72 hours. Fasting Lipid Panel No results for input(s): CHOL, HDL, LDLCALC, TRIG, CHOLHDL, LDLDIRECT in the last 72 hours. Thyroid Function Tests No results for  input(s): TSH, T4TOTAL, T3FREE, THYROIDAB in the last 72 hours.  Invalid input(s): FREET3  Other results:  Imaging   Dg Abd Portable 1v  Result Date: 09/13/2018 CLINICAL DATA:  Nausea, vomiting. EXAM: PORTABLE ABDOMEN - 1 VIEW COMPARISON:  Radiographs of September 09, 2018. FINDINGS: The bowel gas pattern is normal. No radio-opaque calculi or other significant radiographic abnormality are seen. IMPRESSION: No evidence of bowel obstruction or ileus. Electronically Signed   By: Lupita RaiderJames  Green Jr, M.D.   On: 09/13/2018 09:13    Medications:    Scheduled Medications: .  aspirin EC  81 mg Oral Daily  . atorvastatin  40 mg Oral q1800  . carvedilol  3.125 mg Oral BID WC  . glimepiride  4 mg Oral BID WC  . insulin aspart  0-5 Units Subcutaneous QHS  . insulin aspart  0-9 Units Subcutaneous TID WC  . multivitamin with minerals  1 tablet Oral Daily  . sertraline  50 mg Oral Daily  . Warfarin - Pharmacist Dosing Inpatient   Does not apply q1800    Infusions: . ceFEPime (MAXIPIME) IV 1 g (09/13/18 1726)  . vancomycin      PRN Medications: acetaminophen **OR** acetaminophen, bisacodyl, docusate sodium, LORazepam, ondansetron (ZOFRAN) IV, polyethylene glycol, traMADol, traZODone  Patient Profile   Hayden Rasmussenommy L Myre is a 62 y.o. 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  Admitted with abdominal pain, N/V, and AKI.   Assessment/Plan   1. Severe n/v, abdominal pain - Unclear etiology. Now much improved.  - Work up so far unremarkable, but seems to be improving off of amio and metformin.  - Imaging relatively unremarkable as above, including DG abdomen 09/13/18  2.Chronicchronic systolic CHF: -Ischemic cardiomyopathy,Echo 8/23/19EF 25-30% by echo. - Echo 09/12/18 LVEF 20-25%, Severe MR, Mild Mod TI, Trivial PI, PA peak pressure 60 mm Hg - Volume status trending up now.  - Resume torsemide 60 mg daily.  - Continue coreg 3.125 mg BID for now.  -ACE/ARB/spironolactone/digoxin not on board with elevated creatinine.  3. Severe multivessel CAD: -Not thought to be CABG candidate.Interventional cardiology reviewed films at length 08/24/18 and felt not to be candidate for PCI.  - No s/s of ischemia.    - Trop on admit trended down from last check.  - Continue ASA/statin. - Also on coumadin for LV thrombus and ?CVA.  4. LV Thrombus: - INR 2.50 -> 5.93. Dosing per Pharm-D.  - INR elevated, suspect vitamin K deficiency in setting of GI illness.   4.  CKD III-IV:  - Baseline Cr appears to be 2.4 - 2.6 - Cr down to 2.83 today. Continue to follow.   5. 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   6. DM2: - Per primary.  - Off metformin due to GI side effect - Jardiance may be good option.   7. Pleural effusion R>L - With multiple recent thoracentesis, Most recent 09/04/18 with 800 mL off R side.  - Repeat CXR today.   GI symptoms much improved. Work up unremarkable at this point. Appetite improving. No BM yet since diarrhea stopped. Volume trending back up. Resume torsemide. No bleeding with supratherapeutic INR.   Medication concerns reviewed with patient and pharmacy team. Barriers identified: None at this time.   Length of Stay: 0  Luane SchoolMichael Andrew Tillery, PA-C  09/14/2018, 8:30 AM  Advanced Heart Failure Team Pager [phone] (M-F; 7a - 4p)  Please contact [facility] for night-coverage after hours (4p -7a ) and weekends on [email or web address]  Patient seen and examined with the above-signed Advanced Practice Provider and/or Housestaff. I personally reviewed laboratory data, imaging studies and relevant notes. I independently examined the patient and formulated the important aspects of the plan. I have edited the note to reflect any of my changes or salient points. I have personally discussed the plan with the patient and/or family.  He is feeling better today. Less nausea. Able to hold down some po intake. That said remains very tenuous. Creatinine remains elevated and INR has jumped to almost 6. CXR shows worsening bilateral pleural effusions R>L and I am concerned that he may have component of low output. May need RHC to further evaluate. Will also likely need at least a right Pleurex tube. Hold coumadin. Will d/w TCTS. Resume diuretics. Consider gastric emptying study to exclude diabetic gastroparesis - I doubt metformin caused his nausea.   Arvilla Meres, MD  7:18 PM

## 2018-09-14 NOTE — Progress Notes (Signed)
Inpatient Diabetes Program Recommendations  AACE/ADA: New Consensus Statement on Inpatient Glycemic Control (2015)  Target Ranges:  Prepandial:   less than 140 mg/dL      Peak postprandial:   less than 180 mg/dL (1-2 hours)      Critically ill patients:  140 - 180 mg/dL   Lab Results  Component Value Date   GLUCAP 162 (H) 09/14/2018   HGBA1C 8.2 (H) 08/22/2018    Review of Glycemic Control Results for Jose RasmussenSCONTRIAS, Jose Myers (MRN [id number]) as of 09/14/2018 10:20  Ref. Range 09/13/2018 16:20 09/13/2018 21:06 09/14/2018 08:16  Glucose-Capillary Latest Ref Range: 70 - 99 mg/dL 409211 (H) 811167 (H) 914162 (H)   Diabetes history: DM2 Outpatient Diabetes medications: Amaryl 4 mg bid Current orders for Inpatient glycemic control: Novolog 0-9 units TID with meals, Novolog 0-5 units QHS, Amaryl 4 mg BID  Inpatient Diabetes Program Recommendations:    Noted consult.  Metformin 500 mg tid discontinued due to severe GI upset.   Per cards, noted consideration for Jardiance, however, it is not recommended initiating when GFR <45.   Therefore, could consider Tradjenta 5 mg QD and follow up with PCP.  Thanks, Lujean RaveLauren Aveer Bartow, MSN, RNC-OB Diabetes Coordinator [phone] (8a-5p)

## 2018-09-14 NOTE — Progress Notes (Signed)
ANTICOAGULATION CONSULT NOTE   Pharmacy Consult for Coumadin Indication: LV thrombus  No Known Allergies  Patient Measurements: Height: 5\' 9"  (175.3 cm) Weight: 195 lb 15.8 oz (88.9 kg)(b) IBW/kg (Calculated) : 70.7  Vital Signs: Temp: 98 F (36.7 C) (09/17 0527) Temp Source: Oral (09/17 0527) BP: 102/78 (09/17 0527) Pulse Rate: 93 (09/17 0527)  Labs: Recent Labs    23-Apr-2018 2318 09/12/18 0253 09/13/18 1116 09/14/18 0545  HGB  --  13.1 12.7* 12.3*  HCT  --  42.1 39.5 38.4*  PLT  --  239 223 222  LABPROT 26.8*  --  36.7* 52.7*  INR 2.50  --  3.74 5.94*  CREATININE  --  2.98* 2.98* 2.83*  TROPONINI 0.43*  --   --   --     Estimated Creatinine Clearance: 29.1 mL/min (A) (by C-G formula based on SCr of 2.83 mg/dL (H)).   Medical History: Past Medical History:  Diagnosis Date  . Coronary artery disease   . Diabetes mellitus without complication (HCC)   . Hypertension     Medications:  No current facility-administered medications on file prior to encounter.    Current Outpatient Medications on File Prior to Encounter  Medication Sig Dispense Refill  . Apple Cid Vn-Grn Tea-Bit Or-Cr (APPLE CIDER VINEGAR PLUS) TABS Take 1 tablet by mouth daily.    Marland Kitchen. aspirin 81 MG EC tablet Take 1 tablet (81 mg total) by mouth daily. 30 tablet 6  . atorvastatin (LIPITOR) 40 MG tablet Take 1 tablet (40 mg total) by mouth daily at 6 PM. 30 tablet 6  . carvedilol (COREG) 6.25 MG tablet Take 0.5 tablets (3.125 mg total) by mouth 2 (two) times daily with a meal. 30 tablet 6  . docusate sodium (COLACE) 100 MG capsule Take 1 capsule (100 mg total) by mouth 2 (two) times daily as needed for mild constipation. 10 capsule 0  . glimepiride (AMARYL) 4 MG tablet Take 4 mg by mouth 2 (two) times daily.    Marland Kitchen. LORazepam (ATIVAN) 1 MG tablet Take 0.5-1 mg by mouth daily as needed for anxiety.    . Multiple Vitamin (MULTIVITAMIN WITH MINERALS) TABS tablet Take 1 tablet by mouth daily. 30 tablet 6  .  Omega 3 340 MG CPDR Take 1 capsule by mouth at bedtime.    . ondansetron (ZOFRAN-ODT) 4 MG disintegrating tablet Take 1 tablet (4 mg total) by mouth every 8 (eight) hours as needed for nausea or vomiting. 20 tablet 0  . polyethylene glycol (MIRALAX / GLYCOLAX) packet Take 17 g by mouth daily as needed for moderate constipation. 14 each 0  . potassium chloride SA (K-DUR,KLOR-CON) 20 MEQ tablet Take 3 tablets (60 mEq total) by mouth daily. 90 tablet 6  . sertraline (ZOLOFT) 50 MG tablet Take 25 mg by mouth daily.  4  . torsemide (DEMADEX) 20 MG tablet Take 3 tablets (60 mg total) by mouth daily. 90 tablet 6  . traZODone (DESYREL) 50 MG tablet Take 50 mg by mouth at bedtime as needed for sleep.   0  . warfarin (COUMADIN) 2 MG tablet Take 1 tablet (2 mg total) by mouth daily. 30 tablet 11  . feeding supplement, GLUCERNA SHAKE, (GLUCERNA SHAKE) LIQD Take 237 mLs by mouth 2 (two) times daily between meals. (Patient not taking: Reported on 09/12/2018) 60 Can 0     Assessment: 62 y.o. male admitted with N/V/D, h/o LV thrombus, to continue Coumadin.    INR continuing to trend upward today to 5.94,  despite having warfarin dosing consistent with PTA regimen - on 9/15. Hgb 12.3, plt 222. No s/sx of bleeding. Oral intake not accurately documented in chart.   Home regimen: warfarin 2 mg daily (LD 9/14)  Goal of Therapy:  INR 2-3 Monitor platelets by anticoagulation protocol: Yes   Plan:  No Coumadin tonight. Daily PT/INR.  Girard Cooter, PharmD Clinical Pharmacist  Pager: [phone] Phone: [phone]  09/14/2018 7:31 AM

## 2018-09-15 ENCOUNTER — Inpatient Hospital Stay (HOSPITAL_COMMUNITY): Admission: EM | Disposition: E | Payer: Self-pay | Source: Home / Self Care | Attending: Pulmonary Disease

## 2018-09-15 ENCOUNTER — Encounter (HOSPITAL_COMMUNITY): Payer: BLUE CROSS/BLUE SHIELD

## 2018-09-15 ENCOUNTER — Inpatient Hospital Stay (HOSPITAL_COMMUNITY): Payer: BLUE CROSS/BLUE SHIELD

## 2018-09-15 DIAGNOSIS — I5023 Acute on chronic systolic (congestive) heart failure: Secondary | ICD-10-CM

## 2018-09-15 DIAGNOSIS — J90 Pleural effusion, not elsewhere classified: Secondary | ICD-10-CM

## 2018-09-15 DIAGNOSIS — L8990 Pressure ulcer of unspecified site, unspecified stage: Secondary | ICD-10-CM

## 2018-09-15 DIAGNOSIS — J9601 Acute respiratory failure with hypoxia: Secondary | ICD-10-CM

## 2018-09-15 DIAGNOSIS — R570 Cardiogenic shock: Secondary | ICD-10-CM

## 2018-09-15 DIAGNOSIS — N179 Acute kidney failure, unspecified: Secondary | ICD-10-CM

## 2018-09-15 HISTORY — PX: RIGHT HEART CATH: CATH118263

## 2018-09-15 LAB — CBC
HCT: 35 % — ABNORMAL LOW (ref 39.0–52.0)
Hemoglobin: 11.4 g/dL — ABNORMAL LOW (ref 13.0–17.0)
MCH: 28.1 pg (ref 26.0–34.0)
MCHC: 32.6 g/dL (ref 30.0–36.0)
MCV: 86.2 fL (ref 78.0–100.0)
PLATELETS: 204 10*3/uL (ref 150–400)
RBC: 4.06 MIL/uL — AB (ref 4.22–5.81)
RDW: 14.8 % (ref 11.5–15.5)
WBC: 9.9 10*3/uL (ref 4.0–10.5)

## 2018-09-15 LAB — TYPE AND SCREEN
ABO/RH(D): O POS
Antibody Screen: NEGATIVE

## 2018-09-15 LAB — BASIC METABOLIC PANEL
ANION GAP: 13 (ref 5–15)
BUN: 49 mg/dL — ABNORMAL HIGH (ref 8–23)
CHLORIDE: 95 mmol/L — AB (ref 98–111)
CO2: 25 mmol/L (ref 22–32)
Calcium: 8.4 mg/dL — ABNORMAL LOW (ref 8.9–10.3)
Creatinine, Ser: 3.16 mg/dL — ABNORMAL HIGH (ref 0.61–1.24)
GFR calc non Af Amer: 19 mL/min — ABNORMAL LOW (ref >60)
GFR, EST AFRICAN AMERICAN: 22 mL/min — AB (ref >60)
Glucose, Bld: 146 mg/dL — ABNORMAL HIGH (ref 70–99)
Potassium: 4.2 mmol/L (ref 3.5–5.1)
Sodium: 133 mmol/L — ABNORMAL LOW (ref 135–145)

## 2018-09-15 LAB — POCT I-STAT 3, VENOUS BLOOD GAS (G3P V)
Acid-Base Excess: 1 mmol/L (ref 0.0–2.0)
Acid-base deficit: 3 mmol/L — ABNORMAL HIGH (ref 0.0–2.0)
BICARBONATE: 26.2 mmol/L (ref 20.0–28.0)
Bicarbonate: 21.4 mmol/L (ref 20.0–28.0)
O2 SAT: 40 %
O2 Saturation: 38 %
PCO2 VEN: 36.4 mmHg — AB (ref 44.0–60.0)
PCO2 VEN: 41.9 mmHg — AB (ref 44.0–60.0)
PH VEN: 7.404 (ref 7.250–7.430)
PO2 VEN: 22 mmHg — AB (ref 32.0–45.0)
PO2 VEN: 23 mmHg — AB (ref 32.0–45.0)
TCO2: 22 mmol/L (ref 22–32)
TCO2: 27 mmol/L (ref 22–32)
pH, Ven: 7.377 (ref 7.250–7.430)

## 2018-09-15 LAB — PROTIME-INR
INR: 4.5
INR: 4.62 — AB
INR: 2.89
Prothrombin Time: 42.4 seconds — ABNORMAL HIGH (ref 11.4–15.2)
Prothrombin Time: 43.3 seconds — ABNORMAL HIGH (ref 11.4–15.2)
Prothrombin Time: 30 seconds — ABNORMAL HIGH (ref 11.4–15.2)

## 2018-09-15 LAB — GLUCOSE, CAPILLARY
Glucose-Capillary: 121 mg/dL — ABNORMAL HIGH (ref 70–99)
Glucose-Capillary: 296 mg/dL — ABNORMAL HIGH (ref 70–99)
Glucose-Capillary: 123 mg/dL — ABNORMAL HIGH (ref 70–99)
Glucose-Capillary: 145 mg/dL — ABNORMAL HIGH (ref 70–99)

## 2018-09-15 LAB — PROCALCITONIN

## 2018-09-15 LAB — ABO/RH: ABO/RH(D): O POS

## 2018-09-15 LAB — MRSA PCR SCREENING: MRSA BY PCR: NEGATIVE

## 2018-09-15 SURGERY — RIGHT HEART CATH
Anesthesia: LOCAL

## 2018-09-15 MED ORDER — LIDOCAINE HCL (PF) 1 % IJ SOLN
INTRAMUSCULAR | Status: AC
  Filled 2018-09-15: qty 30

## 2018-09-15 MED ORDER — ACETAMINOPHEN 325 MG PO TABS: 650.0000 mg | ORAL_TABLET | ORAL | Status: DC | PRN

## 2018-09-15 MED ORDER — SODIUM CHLORIDE 0.9% FLUSH: 3.0000 mL | INTRAVENOUS | Status: DC | PRN

## 2018-09-15 MED ORDER — FUROSEMIDE 10 MG/ML IJ SOLN
120.0000 mg | Freq: Once | INTRAVENOUS | Status: AC
  Administered 2018-09-15: 120 mg via INTRAVENOUS
  Filled 2018-09-15: qty 10

## 2018-09-15 MED ORDER — VITAMIN K1 10 MG/ML IJ SOLN
10.0000 mg | Freq: Once | INTRAVENOUS | Status: AC
  Administered 2018-09-15: 10 mg via INTRAVENOUS
  Filled 2018-09-15: qty 1

## 2018-09-15 MED ORDER — HEPARIN (PORCINE) IN NACL 1000-0.9 UT/500ML-% IV SOLN
INTRAVENOUS | Status: DC | PRN
  Administered 2018-09-15: 500 mL

## 2018-09-15 MED ORDER — SODIUM CHLORIDE 0.9 % IV SOLN
INTRAVENOUS | Status: DC
  Administered 2018-09-15: 10:00:00 via INTRAVENOUS

## 2018-09-15 MED ORDER — HEPARIN (PORCINE) IN NACL 1000-0.9 UT/500ML-% IV SOLN
INTRAVENOUS | Status: AC
  Filled 2018-09-15: qty 500

## 2018-09-15 MED ORDER — SODIUM CHLORIDE 0.9% FLUSH
3.0000 mL | Freq: Two times a day (BID) | INTRAVENOUS | Status: DC
  Administered 2018-09-15 – 2018-09-18 (×6): 3 mL via INTRAVENOUS

## 2018-09-15 MED ORDER — SODIUM CHLORIDE 0.9% FLUSH
3.0000 mL | Freq: Two times a day (BID) | INTRAVENOUS | Status: DC
  Administered 2018-09-15: 3 mL via INTRAVENOUS

## 2018-09-15 MED ORDER — SODIUM CHLORIDE 0.9 % IV SOLN: 250.0000 mL | INTRAVENOUS | Status: DC | PRN

## 2018-09-15 MED ORDER — PHYTONADIONE 1 MG/0.5 ML ORAL SOLUTION
2.0000 mg | Freq: Once | ORAL | Status: AC
  Administered 2018-09-15: 2 mg via ORAL
  Filled 2018-09-15 (×2): qty 1

## 2018-09-15 MED ORDER — ONDANSETRON HCL 4 MG/2ML IJ SOLN: 4.0000 mg | Freq: Four times a day (QID) | INTRAMUSCULAR | Status: DC | PRN

## 2018-09-15 MED ORDER — MILRINONE LACTATE IN DEXTROSE 20-5 MG/100ML-% IV SOLN
0.2500 ug/kg/min | INTRAVENOUS | Status: DC
  Administered 2018-09-15 – 2018-09-18 (×6): 0.25 ug/kg/min via INTRAVENOUS
  Filled 2018-09-15 (×6): qty 100

## 2018-09-15 SURGICAL SUPPLY — 7 items
CATH BALLN WEDGE 5F 110CM (CATHETERS) ×1 IMPLANT
GUIDEWIRE .025 260CM (WIRE) ×1 IMPLANT
KIT HEART LEFT (KITS) ×2 IMPLANT
PACK CARDIAC CATHETERIZATION (CUSTOM PROCEDURE TRAY) ×2 IMPLANT
SHEATH GLIDE SLENDER 4/5FR (SHEATH) ×1 IMPLANT
TRANSDUCER W/STOPCOCK (MISCELLANEOUS) ×2 IMPLANT
TUBING CIL FLEX 10 FLL-RA (TUBING) ×1 IMPLANT

## 2018-09-15 NOTE — Consult Note (Signed)
NAME:  Jose Myers, MRN:  [id number], DOB:  03-13-1956, LOS: 1 ADMISSION DATE:  09/01/2018, CONSULTATION DATE:  09/26/2018 REFERRING MD:  Dr. Gala Romney, CHIEF COMPLAINT:  SOB  Brief History   62 year old male with PMH of HTN, CAD (NSTEMI 08/15/2018), VT arrest 08/20/2018 with post arrest TTE showing LV thrombus on coumadin, DMT2, CKD stage III- IV admitted with AKI secondary to nausea, vomiting, and diarrhea while taking metformin.  Continued unclear nausea and vomiting.  Worsening hypervolemia, dyspnea, cough, and orthopnea.  Empiric treatment for HCAP starting on 9/16. Worsening symptomatic dyspnea, NP cough, orthopnea, and hypervolemia with increasing bilateral pleural effusions.  INR elevated, being reversed.  RHC 9/18 confirmed cardiogenic shock.  Tx to ICU to be started on milrinone and aggressive diuresis.  PCCM consulted for therapeutic thoracentesis and possible pleurex catheter placement.   Significant Hospital Events   9/14 Admit 9/18 RHC; tx to ICU  Consults: date of consult/date signed off & final recs:  HF team 9/5 Psychiatry 9/15 PCCM 9/18  Procedures (surgical and bedside):  09/07/2018 RHC  Significant Diagnostic Tests:  Echo 09/12/18 - Left ventricle: The cavity size was severely dilated. Systolic   function was severely reduced. The estimated ejection fraction   was in the range of 20% to 25%. - Regional wall motion abnormality: Akinesis of the mid-apical   anterior, mid anteroseptal, mid inferoseptal, apical septal, and   apical myocardium; moderate hypokinesis of the mid anterolateral   and apical lateral myocardium. This is consistent with infarction   in the LAD territory. - Mitral valve: There was severe regurgitation. - Atrial septum: No defect or patent foramen ovale was identified. - Tricuspid valve: There was mild-moderate regurgitation. - Pulmonic valve: There was trivial regurgitation. - Pulmonary arteries: Systolic pressure was severely increased. PA  peak pressure: 60 mm Hg (S). - Pericardium, extracardiac: There was a left pleural effusion. Impressions: - Severe LV dysfunction with wall motion abnormalities consistent   with infarct in LAD territory. Severe MR. Elevated right sided   filling pressures. Similar to prior study.  CT abd/pelvis 09/09/2018 1. Fluid within mildly distended stomach, may be related to p.o. intake. No evidence of gastric inflammation or wall thickening to suggest gastritis. 2. Mild hepatic steatosis. High-density gallbladder contents may be sludge or stones. No gallbladder inflammation. 3. Small to moderate right and small left pleural effusion in the included lung bases, similar to slightly increased from CT 1 month ago. 4. Aortic Atherosclerosis (ICD10-I70.0).  AXR 09/13/18  No evidence of bowel obstruction or ileus  RHC 09/12/2018 >> RA = 4 RV = 48/6 PA = 53/22 (37) PCW = 31 (v waves to 40)  Fick cardiac output/index =3.2/1.5 PVR = 2.0 WU Ao sat = 95% PA sat = 38%, 40%  Micro Data:  Antimicrobials:  9/16 cefepime >> 9/16 vancomycin >>  Subjective:  Patient c/o of SOB and NP cough at rest, ask for fluid to be taken off.  Objective   Blood pressure 97/73, pulse 83, temperature 98.2 F (36.8 C), temperature source Oral, resp. rate 20, height 5\' 9"  (1.753 m), weight 90.4 kg, SpO2 (!) 87 %.        Intake/Output Summary (Last 24 hours) at 09/03/2018 1457 Last data filed at 08/30/2018 0600 Gross per 24 hour  Intake 258.92 ml  Output 800 ml  Net -541.08 ml   Filed Weights   09/13/18 0432 09/14/18 0527 09/10/2018 0545  Weight: 89.7 kg 88.9 kg 90.4 kg    Examination: General:  Chronically ill appearing male sitting upright in bed mildly dyspneic at rest HEENT: MM pink/moist, +JVD Neuro: Alert, oriented, MAE CV: RRR, no murmur, dressing to left upper chest PULM: tachypneic in the lower 20's, saturating 99% on 6L [place], clear anterior breath sounds, diminished bibasilar   ZO:XWRU, slight  distended, non-tender, bs active  Extremities: cool/dry, 1-2+ BLE edema  Skin: no rashes   Resolved Hospital Problem list    Assessment & Plan:   Acute on chronic hypoxic respiratory failure secondary to chronic Bilateral Pleural Effusions most likely related to chronic systolic heart failure - Right greater than left by CXR 9/17 - 08/27/18, IR Right thoracentesis, removed 1.4 L  -  09/03/18- Right thoracentesis by IR, 1.7 L of light amber fluid -  09/04/18-  Left thoracentesis by IR, 800 ml of clear yellow fluid removed off;  transudative by Lights criteria P:  Repeat CXR now Additional vit K 10mg  now for repeat INR 4.62 after vit K 2 mg at 1226, last coumadin dose 9/15, will consider FFP if respiratory status deteriorates however will try and avoid giving more volume  Recheck INR in 4-5 hours Will need INR < 1.8 to proceed with therapeutic thoracentesis as placement of pleurex catheters, are usually only offered as palliative measure and would have to placed by IR Maximize medical management per HF team as below with milrinone/ diuresis  Supplemental O2 prn Could bridge with BiPAP till INR therapeutic     Cardiogenic Shock secondary acute on chronic systolic HF/ ICM - TTE 9/15 LVEF 20-25%, severe MR, mild/mod TI, PAP 60 mmHg - not a candidate for CABG or PCI per last admission P:  per HF team who is starting milrinone and then resuming IV lasix    R/o PNA, left lower opacification  - ?possible aspiration given numerous vomiting - started on vanc/ cefepime 9/16 P:  CXR as above now Check MRSA PCR Check PCT, if negative, low threshold to continue given he remains afebrile and mild leukocytosis resolved   AKI, hx of CKD, stage III- IV P:  Diuresis per HF Trending daily wts/ strict I/Os/ BMETs  LV thrombus - on coumadin P:  Hold coumadin   Remainder per primary team   Disposition / Summary of Today's Plan 09/13/2018   Diuresis/ inotrope per HF team Correction of INR prior  to therapeutic thoracentesis    Diet: NPO Pain/Anxiety/Delirium protocol (if indicated): n/a VAP protocol (if indicated) n/a DVT prophylaxis: systemically supratherapeutic  GI prophylaxis: not indicated Hyperglycemia protocol: SSI Mobility: bedrest Code Status: Full Family Communication: patient updated on plan of care   Labs   CBC: Recent Labs  Lab 2018-10-04 2225 09/12/18 0253 09/13/18 1116 09/14/18 0545 09/17/2018 0609  WBC 12.4* 13.9* 14.7* 11.8* 9.9  HGB 12.8* 13.1 12.7* 12.3* 11.4*  HCT 39.5 42.1 39.5 38.4* 35.0*  MCV 86.8 89.6 86.6 86.5 86.2  PLT 224 239 223 222 204   Basic Metabolic Panel: Recent Labs  Lab Oct 04, 2018 2225 09/12/18 0253 09/13/18 1116 09/14/18 0545 09/13/2018 0609  NA 134* 134* 133* 133* 133*  K 4.4 4.5 4.8 4.4 4.2  CL 95* 97* 94* 95* 95*  CO2 26 21* 25 25 25   GLUCOSE 199* 195* 212* 176* 146*  BUN 34* 34* 39* 43* 49*  CREATININE 3.02* 2.98* 2.98* 2.83* 3.16*  CALCIUM 8.6* 8.8* 8.8* 8.9 8.4*   GFR: Estimated Creatinine Clearance: 26.3 mL/min (A) (by C-G formula based on SCr of 3.16 mg/dL (H)). Recent Labs  Lab 09/12/18 0057 09/12/18 0214  09/12/18 0253 09/13/18 1116 09/14/18 0545 09-16-2018 0609  PROCALCITON  --   --   --  <0.10  --   --   WBC  --   --  13.9* 14.7* 11.8* 9.9  LATICACIDVEN 2.36* 1.69  --   --   --   --    Liver Function Tests: Recent Labs  Lab 09/09/18 1433 09/12/18 0253  AST 26 35  ALT 17 23  ALKPHOS 66 65  BILITOT 1.2 1.3*  PROT 6.2* 6.2*  ALBUMIN 3.0* 3.3*   Recent Labs  Lab 09/09/18 1433  LIPASE 65*  AMYLASE 51   No results for input(s): AMMONIA in the last 168 hours. ABG    Component Value Date/Time   PHART 7.379 08/22/2018 0822   PCO2ART 33.1 08/22/2018 0822   PO2ART 65.0 (L) 08/22/2018 0822   HCO3 19.6 (L) 08/22/2018 0822   TCO2 21 (L) 08/22/2018 0822   ACIDBASEDEF 5.0 (H) 08/22/2018 0822   O2SAT 54.6 08/23/2018 1538    Coagulation Profile: Recent Labs  Lab 09/09/18 1433 08/29/2018 2318  09/13/18 1116 09/14/18 0545 16-Sep-2018 0609  INR 2.00 2.50 3.74 5.94* 4.50*   Cardiac Enzymes: Recent Labs  Lab 09/05/2018 2318  TROPONINI 0.43*   HbA1C: Hgb A1c MFr Bld  Date/Time Value Ref Range Status  08/22/2018 03:37 AM 8.2 (H) 4.8 - 5.6 % Final    Comment:    (NOTE) Pre diabetes:          5.7%-6.4% Diabetes:              >6.4% Glycemic control for   <7.0% adults with diabetes   08/21/2018 03:40 AM 8.2 (H) 4.8 - 5.6 % Final    Comment:    (NOTE) Pre diabetes:          5.7%-6.4% Diabetes:              >6.4% Glycemic control for   <7.0% adults with diabetes    CBG: Recent Labs  Lab 09/14/18 1128 09/14/18 1652 09/14/18 2107 2018-09-16 0737 September 16, 2018 1154  GLUCAP 187* 310* 213* 121* 296*    Admitting History of Present Illness.   62 year old male with PMH of HTN, CAD (NSTEMI 08/15/2018), VT arrest 08/20/2018 with post arrest TTE showing LV thrombus on coumadin, DMT2, CKD stage III- IV admitted 9/14 with 2 day history of nausea, vomiting, and diarrhea.  His amiodarone was held, given zofran, and metformin stopped after increase in sCR by HF but patient did not have symptomatic improvement and therefore was admitted by [facility].  Diarrhea resolved after stopping metformin, however patient has continued to have nausea and vomiting of unclear etiology; negative AXR.  Patient with increasing leukocytosis and developing dyspnea and orthopnea.  He was started on empiric cefepime and vancomycin 9/16. Diuretics were held given his AKI, and patient became volume overloaded with increased bilateral pleural effusions and developing non productive cough with orthopnea and dyspnea at rest.  Additionally, his INR has continued to rise since 9/14; LFTs were grossly normal 9/15.  Patient taken for diagnostic right heart cath today which confirmed cardiogenic shock.  Patient to return to ICU to be started on milrinone and aggressive diuresis.  PCCM consulted for additional therapeutic thoracentesis and  possible placement of pleurex catheter given his ongoing pleural effusions.    On chart review, patient had right thoracentesis on 08/27/18 by IR Right thoracentesis with 1.4L removed.  Additional Right thoracentesis on 9/6 by IR with1.7 L of light amber fluid removed.  On  09/04/18, patient underwent Left thoracentesis by IR, with removal of 800 ml of clear yellow fluid which was transudative by Lights criteria.   Review of Systems:   POSITIVES IN BOLD Gen: Denies fever, chills, weight change, fatigue, night sweats HEENT: Denies vision changes, sinus congestion, rhinorrhea, dysphagia PULM: Denies shortness of breath, cough, sputum production, hemoptysis, wheezing CV: Denies chest pain, edema, orthopnea,  palpitations GI: Denies abdominal pain, nausea, vomiting, change in bowel habits Derm: Denies rash or skin change Heme: Denies easy bruising, bleeding, bleeding gums Neuro: Denies headache, numbness, weakness, slurred speech, loss of memory or consciousness  Past medical history  He,  has a past medical history of Coronary artery disease, Diabetes mellitus without complication (HCC), and Hypertension.   Surgical History    Past Surgical History:  Procedure Laterality Date  . CARDIAC CATHETERIZATION    . CORONARY ANGIOGRAPHY N/A 08/19/2018   Procedure: CORONARY ANGIOGRAPHY (CATH LAB);  Surgeon: Lyn Records, MD;  [facility]: [facility] INVASIVE CV LAB;  Service: Cardiovascular;  Laterality: N/A;  . EYE SURGERY    . ICD IMPLANT N/A 08/31/2018   Procedure: ICD IMPLANT;  Surgeon: Marinus Maw, MD;  Location: [facility] INVASIVE CV LAB;  Service: Cardiovascular;  Laterality: N/A;  . IR THORACENTESIS ASP PLEURAL SPACE W/IMG GUIDE  08/27/2018  . IR THORACENTESIS ASP PLEURAL SPACE W/IMG GUIDE  09/03/2018  . PERCUTANEOUS CORONARY STENT INTERVENTION (PCI-S)  04/11/2003   LAD  . RIGHT HEART CATH N/A 08/19/2018   Procedure: RIGHT HEART CATH;  Surgeon: Lyn Records, MD;  Location: [facility] INVASIVE CV LAB;  Service:  Cardiovascular;  Laterality: N/A;     Social History   Social History   Socioeconomic History  . Marital status: Married    Spouse name: Not on file  . Number of children: Not on file  . Years of education: Not on file  . Highest education level: Not on file  Occupational History  . Not on file  Social Needs  . Financial resource strain: Not on file  . Food insecurity:    Worry: Not on file    Inability: Not on file  . Transportation needs:    Medical: Not on file    Non-medical: Not on file  Tobacco Use  . Smoking status: Never Smoker  . Smokeless tobacco: Never Used  Substance and Sexual Activity  . Alcohol use: Yes    Comment: Occasionally.  . Drug use: Not on file  . Sexual activity: Not on file  Lifestyle  . Physical activity:    Days per week: Not on file    Minutes per session: Not on file  . Stress: Not on file  Relationships  . Social connections:    Talks on phone: Not on file    Gets together: Not on file    Attends religious service: Not on file    Active member of club or organization: Not on file    Attends meetings of clubs or organizations: Not on file    Relationship status: Not on file  . Intimate partner violence:    Fear of current or ex partner: Not on file    Emotionally abused: Not on file    Physically abused: Not on file    Forced sexual activity: Not on file  Other Topics Concern  . Not on file  Social History Narrative  . Not on file  ,  reports that he has never smoked. He has never used smokeless tobacco. He reports  that he drinks alcohol.   Family history   His family history includes Diabetes Mellitus II in his mother; Throat cancer in his brother.   Allergies No Known Allergies  Home meds  Prior to Admission medications   Medication Sig Start Date End Date Taking? Authorizing Provider  Apple Cid Vn-Grn Tea-Bit Or-Cr (APPLE CIDER VINEGAR PLUS) TABS Take 1 tablet by mouth daily.   Yes [provider]  aspirin 81 MG  EC tablet Take 1 tablet (81 mg total) by mouth daily. 09/07/18  Yes Alford HighlandSmith, Ashley M, NP  atorvastatin (LIPITOR) 40 MG tablet Take 1 tablet (40 mg total) by mouth daily at 6 PM. 09/06/18  Yes Alford HighlandSmith, Ashley M, NP  carvedilol (COREG) 6.25 MG tablet Take 0.5 tablets (3.125 mg total) by mouth 2 (two) times daily with a meal. 09/06/18  Yes Alford HighlandSmith, Ashley M, NP  docusate sodium (COLACE) 100 MG capsule Take 1 capsule (100 mg total) by mouth 2 (two) times daily as needed for mild constipation. 09/06/18  Yes Alford HighlandSmith, Ashley M, NP  glimepiride (AMARYL) 4 MG tablet Take 4 mg by mouth 2 (two) times daily.   Yes [provider]  LORazepam (ATIVAN) 1 MG tablet Take 0.5-1 mg by mouth daily as needed for anxiety.   Yes [provider]  Multiple Vitamin (MULTIVITAMIN WITH MINERALS) TABS tablet Take 1 tablet by mouth daily. 09/07/18  Yes Alford HighlandSmith, Ashley M, NP  Omega 3 340 MG CPDR Take 1 capsule by mouth at bedtime.   Yes [provider]  ondansetron (ZOFRAN-ODT) 4 MG disintegrating tablet Take 1 tablet (4 mg total) by mouth every 8 (eight) hours as needed for nausea or vomiting. 09/09/18  Yes Bensimhon, Bevelyn Bucklesaniel R, MD  polyethylene glycol (MIRALAX / GLYCOLAX) packet Take 17 g by mouth daily as needed for moderate constipation. 09/06/18  Yes Alford HighlandSmith, Ashley M, NP  potassium chloride SA (K-DUR,KLOR-CON) 20 MEQ tablet Take 3 tablets (60 mEq total) by mouth daily. 09/09/18  Yes Bensimhon, Bevelyn Bucklesaniel R, MD  sertraline (ZOLOFT) 50 MG tablet Take 25 mg by mouth daily. 07/13/18  Yes [provider]  torsemide (DEMADEX) 20 MG tablet Take 3 tablets (60 mg total) by mouth daily. 09/07/18  Yes Alford HighlandSmith, Ashley M, NP  traZODone (DESYREL) 50 MG tablet Take 50 mg by mouth at bedtime as needed for sleep.  08/13/18  Yes [provider]  warfarin (COUMADIN) 2 MG tablet Take 1 tablet (2 mg total) by mouth daily. 09/06/18 09/06/19 Yes Alford HighlandSmith, Ashley M, NP  feeding supplement, GLUCERNA SHAKE, (GLUCERNA SHAKE) LIQD Take 237 mLs by  mouth 2 (two) times daily between meals. Patient not taking: Reported on 09/12/2018 09/06/18   Alford HighlandSmith, Ashley M, NP     Posey BoyerBrooke Avaline Stillson, AGACNP-BC [facility] Pgr: [phone] or if no answer [phone] 09/22/2018, 3:28 PM

## 2018-09-15 NOTE — Interval H&P Note (Signed)
History and Physical Interval Note:  2018-10-26 1:58 PM  Jose Myers  has presented today for surgery, with the diagnosis of hf  The various methods of treatment have been discussed with the patient and family. After consideration of risks, benefits and other options for treatment, the patient has consented to  Procedure(s): RIGHT HEART CATH (N/A) as a surgical intervention .  The patient's history has been reviewed, patient examined, no change in status, stable for surgery.  I have reviewed the patient's chart and labs.  Questions were answered to the patient's satisfaction.     Daniel Bensimhon

## 2018-09-15 NOTE — H&P (View-Only) (Signed)
Advanced Heart Failure Rounding Note  PCP-Cardiologist: [name], MD   Subjective:    [name] On Vanc and Cefepime for PNA coverage. Afebrile. WBC 9.9  Hemoglobin 12.3 > 11.4. INR 4.5  Orthopnea is worse. Wants to get fluid drained off his lungs. +cough with clear sputum. No SOB when ambulating in hallways. No abdominal pain this am. +flatus. No BM yet.   Echo 09/12/18 - LVEF 20-25%, Severe MR, Mild Mod TI, Trivial PI, PA peak pressure 60 mm Hg  CT abd/pelvis 09/08/2018 1. Fluid within mildly distended stomach, may be related to p.o. intake. No evidence of gastric inflammation or wall thickening to suggest gastritis. 2. Mild hepatic steatosis. High-density gallbladder contents may be sludge or stones. No gallbladder inflammation. 3. Small to moderate right and small left pleural effusion in the included lung bases, similar to slightly increased from CT 1 month ago. 4.  Aortic Atherosclerosis (ICD10-I70.0).  Objective:    Weight Range: 90.4 kg Body mass index is 29.42 kg/m.   Vital Signs:   Temp:  [97.7 F (36.5 C)-98.3 F (36.8 C)] 97.8 F (36.6 C) (09/18 0545) Pulse Rate:  [87-100] 87 (09/18 0545) Resp:  [16-17] 16 (09/18 0545) BP: (86-105)/(69-83) 91/69 (09/18 0545) SpO2:  [93 %-99 %] 93 % (09/18 0545) Weight:  [90.4 kg] 90.4 kg (09/18 0545) Last BM Date: 09/19/2018  Weight change: Filed Weights   09/13/18 0432 09/14/18 0527 03-Oct-2018 0545  Weight: 89.7 kg 88.9 kg 90.4 kg   Intake/Output:   Intake/Output Summary (Last 24 hours) at 03-Oct-2018 0926 Last data filed at October 03, 2018 0600 Gross per 24 hour  Intake 258.92 ml  Output 800 ml  Net -541.08 ml    Physical Exam    General: Sitting up in bed dyspneic at rest.  HEENT: Normal Neck: Supple. JVP 8-9. Carotids  2+ bilat; no bruits. No thyromegaly or nodule noted. Cor: PMI nondisplaced. RRR, No M/G/R noted Lungs: diminished 1/2 way up  On 2L [place] Abdomen: Soft, non-tender, non-distended, no HSM. No bruits or masses. +BS  Extremities: No cyanosis, clubbing, or rash. R and LLE 1+ edema. Neuro: Alert & orientedx3, cranial nerves grossly intact. moves all 4 extremities w/o difficulty. Affect pleasant  Telemetry   NSR 80-90s. Personally reviewed.  EKG    No new tracings.    Labs    CBC Recent Labs    09/14/18 0545 10-03-18 0609  WBC 11.8* 9.9  HGB 12.3* 11.4*  HCT 38.4* 35.0*  MCV 86.5 86.2  PLT 222 204   Basic Metabolic Panel Recent Labs    03/47/42 0545 10-03-2018 0609  NA 133* 133*  K 4.4 4.2  CL 95* 95*  CO2 25 25  GLUCOSE 176* 146*  BUN 43* 49*  CREATININE 2.83* 3.16*  CALCIUM 8.9 8.4*   Liver Function Tests No results for input(s): AST, ALT, ALKPHOS, BILITOT, PROT, ALBUMIN in the last 72 hours. No results for input(s): LIPASE, AMYLASE in the last 72 hours. Cardiac Enzymes No results for input(s): CKTOTAL, CKMB, CKMBINDEX, TROPONINI in the last 72 hours. BNP: BNP (last 3 results) Recent Labs    08/15/18 1649 08/20/18 1258 09/10/2018 2318  BNP 930.7* 1,184.0* 1,989.1*   ProBNP (last 3 results) No results for input(s): PROBNP in the last 8760 hours.  D-Dimer No results for input(s): DDIMER in the last 72  hours. Hemoglobin A1C No results for input(s): HGBA1C in the last 72 hours. Fasting Lipid Panel No results for input(s): CHOL, HDL, LDLCALC, TRIG, CHOLHDL, LDLDIRECT in the last 72 hours. Thyroid Function Tests No results for input(s): TSH, T4TOTAL, T3FREE, THYROIDAB in the last 72 hours.  Invalid input(s): FREET3  Other results:  Imaging   No results found.  Medications:    Scheduled Medications: . aspirin EC  81 mg Oral Daily  . atorvastatin  40 mg Oral q1800  . glimepiride  4 mg Oral BID WC  . insulin aspart  0-5 Units Subcutaneous QHS  . insulin  aspart  0-9 Units Subcutaneous TID WC  . multivitamin with minerals  1 tablet Oral Daily  . sertraline  50 mg Oral Daily  . torsemide  60 mg Oral Daily    Infusions: . ceFEPime (MAXIPIME) IV 1 g (09/14/18 1812)  . vancomycin 1,000 mg (09/14/18 1856)    PRN Medications: acetaminophen **OR** acetaminophen, bisacodyl, docusate sodium, LORazepam, ondansetron (ZOFRAN) IV, polyethylene glycol, traMADol, traZODone  Patient Profile   Jose Myers is a 62 y.o. 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  Admitted with abdominal pain, N/V, and AKI.   Assessment/Plan   1. Severe n/v, abdominal pain - Unclear etiology. Now much improved. On vanc/cefepime for ?PNA. Afebrile. WBC normal. - Work up so far unremarkable, but seems to be improving off of amio and metformin.  - Imaging relatively unremarkable as above, including DG abdomen 09/13/18  2.Acute onchronic systolic CHF: -Ischemic cardiomyopathy,Echo 8/23/19EF 25-30% by echo. - Echo 09/12/18 LVEF 20-25%, Severe MR, Mild Mod TI, Trivial PI, PA peak pressure 60 mm Hg - Volume status elevated.  - Hold torsemide with AKI. Will set him up for RHC and dose diuretics afterward.  - Coreg stopped with concern for low output.  -ACE/ARB/spironolactone/digoxin not on board with elevated creatinine. - Consider gastric emptying study to look for diabetic gastroparesis - Creatinine worse today. May be low output. Will set up for RHC.   3. Severe multivessel CAD: -Not thought to be CABG candidate.Interventional cardiology reviewed films at length 08/24/18 and felt not to be candidate for PCI.  - No s/s ischemia. - Trop on admit trended down from last check.  - Continue ASA/statin. - Also on coumadin for LV thrombus and ?CVA.  4. LV Thrombus: - INR 2.50 -> 5.93 -> 4.5. Dosing per Pharm-D.  - INR elevated, suspect vitamin K deficiency in  setting of GI illness.   4. CKD III-IV:  - Baseline Cr appears to be 2.4 - 2.6 - Cr up to 3.16 today. Continue to follow.   5. 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 NO change.   6. DM2: - Per primary. No change.  - Off metformin due to GI side effect - Jardiance may be good option.   7. Pleural effusion R>L - With multiple recent thoracentesis, Most recent 09/04/18 with 800 mL off R side.  - CXR yesterday showed increase in bilateral pleural effusions. Dr Gala RomneyBensimhon discussing with TCTS. He did not get coumadin last night, but INR still 4.5. Discussed with Dr Gala RomneyBensimhon. Will give him 2 mg Vit K today.   Will set him up for RHC today. He will be an add on. Discussed with patient. He is frustrated, but agreeable.   Medication concerns reviewed with patient and pharmacy team. Barriers identified: None at this time.   Length of Stay:  1  Alford Highland, NP  09/03/2018, 9:26 AM  Advanced Heart Failure Team Pager [phone] (M-F; 7a - 4p)  Please contact [facility] for night-coverage after hours (4p -7a ) and weekends on [email or web address]   Patient seen and examined with the above-signed Advanced Practice Provider and/or Housestaff. I personally reviewed laboratory data, imaging studies and relevant notes. I independently examined the patient and formulated the important aspects of the plan. I have edited the note to reflect any of my changes or salient points. I have personally discussed the plan with the patient and/or family.  He is increasingly ill. Respiratory status very tenuous. Creatinine getting worse. Will proceed with RHC to assess for low output but will also need thoracentesis vs pleurex asap. Unfortunately INR very high. Will give vitamin K. Will also likely need FFP.   Arvilla Meres, MD  1:52 PM

## 2018-09-15 NOTE — Progress Notes (Addendum)
  Advanced Heart Failure Rounding Note  PCP-Cardiologist: Bridgette Christopher, MD   Subjective:    [name] On Vanc and Cefepime for PNA coverage. Afebrile. WBC 9.9  Hemoglobin 12.3 > 11.4. INR 4.5  Orthopnea is worse. Wants to get fluid drained off his lungs. +cough with clear sputum. No SOB when ambulating in hallways. No abdominal pain this am. +flatus. No BM yet.   Echo 09/12/18 - LVEF 20-25%, Severe MR, Mild Mod TI, Trivial PI, PA peak pressure 60 mm Hg  CT abd/pelvis 08/31/2018 1. Fluid within mildly distended stomach, may be related to p.o. intake. No evidence of gastric inflammation or wall thickening to suggest gastritis. 2. Mild hepatic steatosis. High-density gallbladder contents may be sludge or stones. No gallbladder inflammation. 3. Small to moderate right and small left pleural effusion in the included lung bases, similar to slightly increased from CT 1 month ago. 4.  Aortic Atherosclerosis (ICD10-I70.0).  Objective:    Weight Range: 90.4 kg Body mass index is 29.42 kg/m.   Vital Signs:   Temp:  [97.7 F (36.5 C)-98.3 F (36.8 C)] 97.8 F (36.6 C) (09/18 0545) Pulse Rate:  [87-100] 87 (09/18 0545) Resp:  [16-17] 16 (09/18 0545) BP: (86-105)/(69-83) 91/69 (09/18 0545) SpO2:  [93 %-99 %] 93 % (09/18 0545) Weight:  [90.4 kg] 90.4 kg (09/18 0545) Last BM Date: 09/23/2018  Weight change: Filed Weights   09/13/18 0432 09/14/18 0527 09/10/2018 0545  Weight: 89.7 kg 88.9 kg 90.4 kg   Intake/Output:   Intake/Output Summary (Last 24 hours) at 09/01/2018 0926 Last data filed at 09/14/2018 0600 Gross per 24 hour  Intake 258.92 ml  Output 800 ml  Net -541.08 ml    Physical Exam    General: Sitting up in bed dyspneic at rest.  HEENT: Normal Neck: Supple. JVP 8-9. Carotids  2+ bilat; no bruits. No thyromegaly or nodule noted. Cor: PMI nondisplaced. RRR, No M/G/R noted Lungs: diminished 1/2 way up  On 2L [place] Abdomen: Soft, non-tender, non-distended, no HSM. No bruits or masses. +BS  Extremities: No cyanosis, clubbing, or rash. R and LLE 1+ edema. Neuro: Alert & orientedx3, cranial nerves grossly intact. moves all 4 extremities w/o difficulty. Affect pleasant  Telemetry   NSR 80-90s. Personally reviewed.  EKG    No new tracings.    Labs    CBC Recent Labs    09/14/18 0545 09/17/2018 0609  WBC 11.8* 9.9  HGB 12.3* 11.4*  HCT 38.4* 35.0*  MCV 86.5 86.2  PLT 222 204   Basic Metabolic Panel Recent Labs    09/14/18 0545 09/27/2018 0609  NA 133* 133*  K 4.4 4.2  CL 95* 95*  CO2 25 25  GLUCOSE 176* 146*  BUN 43* 49*  CREATININE 2.83* 3.16*  CALCIUM 8.9 8.4*   Liver Function Tests No results for input(s): AST, ALT, ALKPHOS, BILITOT, PROT, ALBUMIN in the last 72 hours. No results for input(s): LIPASE, AMYLASE in the last 72 hours. Cardiac Enzymes No results for input(s): CKTOTAL, CKMB, CKMBINDEX, TROPONINI in the last 72 hours. BNP: BNP (last 3 results) Recent Labs    08/15/18 1649 08/20/18 1258 09/10/2018 2318  BNP 930.7* 1,184.0* 1,989.1*   ProBNP (last 3 results) No results for input(s): PROBNP in the last 8760 hours.  D-Dimer No results for input(s): DDIMER in the last 72   hours. Hemoglobin A1C No results for input(s): HGBA1C in the last 72 hours. Fasting Lipid Panel No results for input(s): CHOL, HDL, LDLCALC, TRIG, CHOLHDL, LDLDIRECT in the last 72 hours. Thyroid Function Tests No results for input(s): TSH, T4TOTAL, T3FREE, THYROIDAB in the last 72 hours.  Invalid input(s): FREET3  Other results:  Imaging   No results found.  Medications:    Scheduled Medications: . aspirin EC  81 mg Oral Daily  . atorvastatin  40 mg Oral q1800  . glimepiride  4 mg Oral BID WC  . insulin aspart  0-5 Units Subcutaneous QHS  . insulin  aspart  0-9 Units Subcutaneous TID WC  . multivitamin with minerals  1 tablet Oral Daily  . sertraline  50 mg Oral Daily  . torsemide  60 mg Oral Daily    Infusions: . ceFEPime (MAXIPIME) IV 1 g (09/14/18 1812)  . vancomycin 1,000 mg (09/14/18 1856)    PRN Medications: acetaminophen **OR** acetaminophen, bisacodyl, docusate sodium, LORazepam, ondansetron (ZOFRAN) IV, polyethylene glycol, traMADol, traZODone  Patient Profile   Jose Myers is a 62 y.o. 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  Admitted with abdominal pain, N/V, and AKI.   Assessment/Plan   1. Severe n/v, abdominal pain - Unclear etiology. Now much improved. On vanc/cefepime for ?PNA. Afebrile. WBC normal. - Work up so far unremarkable, but seems to be improving off of amio and metformin.  - Imaging relatively unremarkable as above, including DG abdomen 09/13/18  2.Acute onchronic systolic CHF: -Ischemic cardiomyopathy,Echo 8/23/19EF 25-30% by echo. - Echo 09/12/18 LVEF 20-25%, Severe MR, Mild Mod TI, Trivial PI, PA peak pressure 60 mm Hg - Volume status elevated.  - Hold torsemide with AKI. Will set him up for RHC and dose diuretics afterward.  - Coreg stopped with concern for low output.  -ACE/ARB/spironolactone/digoxin not on board with elevated creatinine. - Consider gastric emptying study to look for diabetic gastroparesis - Creatinine worse today. May be low output. Will set up for RHC.   3. Severe multivessel CAD: -Not thought to be CABG candidate.Interventional cardiology reviewed films at length 08/24/18 and felt not to be candidate for PCI.  - No s/s ischemia. - Trop on admit trended down from last check.  - Continue ASA/statin. - Also on coumadin for LV thrombus and ?CVA.  4. LV Thrombus: - INR 2.50 -> 5.93 -> 4.5. Dosing per Pharm-D.  - INR elevated, suspect vitamin K deficiency in  setting of GI illness.   4. CKD III-IV:  - Baseline Cr appears to be 2.4 - 2.6 - Cr up to 3.16 today. Continue to follow.   5. 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 NO change.   6. DM2: - Per primary. No change.  - Off metformin due to GI side effect - Jardiance may be good option.   7. Pleural effusion R>L - With multiple recent thoracentesis, Most recent 09/04/18 with 800 mL off R side.  - CXR yesterday showed increase in bilateral pleural effusions. Dr Bensimhon discussing with TCTS. He did not get coumadin last night, but INR still 4.5. Discussed with Dr Bensimhon. Will give him 2 mg Vit K today.   Will set him up for RHC today. He will be an add on. Discussed with patient. He is frustrated, but agreeable.   Medication concerns reviewed with patient and pharmacy team. Barriers identified: None at this time.   Length of Stay:   1  Ashley M Smith, NP  09/14/2018, 9:26 AM  Advanced Heart Failure Team Pager [phone] (M-F; 7a - 4p)  Please contact [facility] for night-coverage after hours (4p -7a ) and weekends on [email or web address]   Patient seen and examined with the above-signed Advanced Practice Provider and/or Housestaff. I personally reviewed laboratory data, imaging studies and relevant notes. I independently examined the patient and formulated the important aspects of the plan. I have edited the note to reflect any of my changes or salient points. I have personally discussed the plan with the patient and/or family.  He is increasingly ill. Respiratory status very tenuous. Creatinine getting worse. Will proceed with RHC to assess for low output but will also need thoracentesis vs pleurex asap. Unfortunately INR very high. Will give vitamin K. Will also likely need FFP.   Daniel Bensimhon, MD  1:52 PM   

## 2018-09-15 NOTE — Progress Notes (Signed)
CRITICAL VALUE ALERT  Critical Value:  INR 4.62 / PT 43.3  Date & Time Notied:  2018/01/07 @ 1545  Provider Notified: Dr. Minette HeadlandAljishi  Orders Received/Actions taken: No orders

## 2018-09-15 NOTE — Plan of Care (Signed)
  Problem: Activity: Goal: Risk for activity intolerance will decrease Outcome: Progressing   Problem: Elimination: Goal: Will not experience complications related to bowel motility Outcome: Progressing   Problem: Pain Managment: Goal: General experience of comfort will improve Outcome: Progressing   Problem: Activity: Goal: Capacity to carry out activities will improve Outcome: Progressing   

## 2018-09-15 NOTE — Progress Notes (Signed)
INR 4.50. Page MD Jarvis Newcomergrunz  With results  Bettey MareJasmina  Chia Mowers, RN

## 2018-09-15 NOTE — Progress Notes (Signed)
Inpatient Diabetes Program Recommendations  AACE/ADA: New Consensus Statement on Inpatient Glycemic Control (2015)  Target Ranges:  Prepandial:   less than 140 mg/dL      Peak postprandial:   less than 180 mg/dL (1-2 hours)      Critically ill patients:  140 - 180 mg/dL   Lab Results  Component Value Date   GLUCAP 121 (H) 09/16/2018   HGBA1C 8.2 (H) 08/22/2018    Review of Glycemic Control Results for Hayden RasmussenSCONTRIAS, Shawntez L (MRN [id number]) as of 09/03/2018 10:46  Ref. Range 09/14/2018 16:52 09/14/2018 21:07 09/27/2018 07:37  Glucose-Capillary Latest Ref Range: 70 - 99 mg/dL 086310 (H) 578213 (H) 469121 (H)   Diabetes history:DM2 Outpatient Diabetes medications:Amaryl 4 mg bid Current orders for Inpatient glycemic control:Novolog 0-9 units TID with meals, Novolog 0-5 units QHS, Amaryl 4 mg BID  Inpatient Diabetes Program Recommendations:    Noted BS of 310 mg/dL, unsure if this was post prandial? However, other BS look okay. Given worsening renal fx, will watch trends.   Per cards, noted consideration for Jardiance, however, it is not recommended initiating when GFR <45. Therefore, could consider Tradjenta 5 mg QD and follow up with PCP.  Thanks, Lujean RaveLauren Jonee Lamore, MSN, RNC-OB Diabetes Coordinator [phone] (8a-5p)

## 2018-09-15 NOTE — Progress Notes (Signed)
I received a page from the nurse to visit with the patient. I provided spiritual and emotional support by listening to the patient with compassion and by leading in prayer. His wife was also present in the room and received spiritual support and words of encouragement. I shared that the Chaplain is available for additional support as needed or requested.    09/13/2018 1540  Clinical Encounter Type  Visited With Patient and family together  Visit Type Spiritual support  Referral From Nurse  Consult/Referral To Chaplain  Spiritual Encounters  Spiritual Needs Prayer;Emotional  Stress Factors  Patient Stress Factors Exhausted  Family Stress Factors Exhausted    Chaplain Dr Melvyn NovasMichael Tonia Avino

## 2018-09-15 NOTE — Progress Notes (Signed)
Contacted pharmacy about Vitamin K that was due at 10 am.    Bettey MareJasmina  Laquanna Veazey, RN

## 2018-09-15 NOTE — Progress Notes (Signed)
  Cardiac cath with cardiogenic shock and high filling pressures. Now in ICU.   Still SOB but breathing better on milrinone.   Receiving IV vitamin k per CCM. For INR 4.5  I gave him 120 IV lasix but little to no response. Hopefully will improve with inotropic support.   Will likely need central access and thoracentesis once INR down.   Will have to consider w/u for advanced HF therapies.   Situation discussed with him and his wife at length. He is quite tenuous.   He is ok with intubation if needed.   CRITICAL CARE Performed by: Arvilla MeresBensimhon, Daniel  Total critical care time: 35 minutes  Critical care time was exclusive of separately billable procedures and treating other patients.  Critical care was necessary to treat or prevent imminent or life-threatening deterioration.  Critical care was time spent personally by me (independent of midlevel providers or residents) on the following activities: development of treatment plan with patient and/or surrogate as well as nursing, discussions with consultants, evaluation of patient's response to treatment, examination of patient, obtaining history from patient or surrogate, ordering and performing treatments and interventions, ordering and review of laboratory studies, ordering and review of radiographic studies, pulse oximetry and re-evaluation of patient's condition.  Arvilla Meresaniel Bensimhon, MD  8:14 PM

## 2018-09-16 ENCOUNTER — Inpatient Hospital Stay (HOSPITAL_COMMUNITY): Payer: BLUE CROSS/BLUE SHIELD

## 2018-09-16 ENCOUNTER — Encounter (HOSPITAL_COMMUNITY): Payer: Self-pay | Admitting: Internal Medicine

## 2018-09-16 LAB — BODY FLUID CELL COUNT WITH DIFFERENTIAL
EOS FL: 0 %
LYMPHS FL: 74 %
MONOCYTE-MACROPHAGE-SEROUS FLUID: 17 % — AB (ref 50–90)
Neutrophil Count, Fluid: 8 % (ref 0–25)
Total Nucleated Cell Count, Fluid: 127 cu mm (ref 0–1000)

## 2018-09-16 LAB — BASIC METABOLIC PANEL
ANION GAP: 10 (ref 5–15)
BUN: 47 mg/dL — ABNORMAL HIGH (ref 8–23)
CALCIUM: 8.2 mg/dL — AB (ref 8.9–10.3)
CO2: 26 mmol/L (ref 22–32)
Chloride: 98 mmol/L (ref 98–111)
Creatinine, Ser: 2.87 mg/dL — ABNORMAL HIGH (ref 0.61–1.24)
GFR calc Af Amer: 25 mL/min — ABNORMAL LOW (ref >60)
GFR, EST NON AFRICAN AMERICAN: 22 mL/min — AB (ref >60)
GLUCOSE: 136 mg/dL — AB (ref 70–99)
POTASSIUM: 3.3 mmol/L — AB (ref 3.5–5.1)
SODIUM: 134 mmol/L — AB (ref 135–145)

## 2018-09-16 LAB — GLUCOSE, CAPILLARY
Glucose-Capillary: 109 mg/dL — ABNORMAL HIGH (ref 70–99)
Glucose-Capillary: 114 mg/dL — ABNORMAL HIGH (ref 70–99)
Glucose-Capillary: 266 mg/dL — ABNORMAL HIGH (ref 70–99)
Glucose-Capillary: 168 mg/dL — ABNORMAL HIGH (ref 70–99)

## 2018-09-16 LAB — PROTIME-INR
INR: 1.98
INR: 1.53
PROTHROMBIN TIME: 22.4 s — AB (ref 11.4–15.2)
Prothrombin Time: 18.3 seconds — ABNORMAL HIGH (ref 11.4–15.2)

## 2018-09-16 LAB — MAGNESIUM: MAGNESIUM: 2.2 mg/dL (ref 1.7–2.4)

## 2018-09-16 LAB — CBC
HCT: 33.1 % — ABNORMAL LOW (ref 39.0–52.0)
HEMOGLOBIN: 10.7 g/dL — AB (ref 13.0–17.0)
MCH: 27.9 pg (ref 26.0–34.0)
MCHC: 32.3 g/dL (ref 30.0–36.0)
MCV: 86.2 fL (ref 78.0–100.0)
Platelets: 176 10*3/uL (ref 150–400)
RBC: 3.84 MIL/uL — ABNORMAL LOW (ref 4.22–5.81)
RDW: 14.6 % (ref 11.5–15.5)
WBC: 8.6 10*3/uL (ref 4.0–10.5)

## 2018-09-16 LAB — LACTATE DEHYDROGENASE, PLEURAL OR PERITONEAL FLUID: LD FL: 69 U/L — AB (ref 3–23)

## 2018-09-16 LAB — LACTATE DEHYDROGENASE: LDH: 203 U/L — ABNORMAL HIGH (ref 98–192)

## 2018-09-16 LAB — PROTEIN, PLEURAL OR PERITONEAL FLUID

## 2018-09-16 LAB — CHOLESTEROL, TOTAL: CHOLESTEROL: 101 mg/dL (ref 0–200)

## 2018-09-16 LAB — PROTEIN, TOTAL: Total Protein: 6.1 g/dL — ABNORMAL LOW (ref 6.5–8.1)

## 2018-09-16 MED ORDER — POTASSIUM CHLORIDE CRYS ER 20 MEQ PO TBCR
40.0000 meq | EXTENDED_RELEASE_TABLET | Freq: Two times a day (BID) | ORAL | Status: DC
  Administered 2018-09-16 – 2018-09-18 (×5): 40 meq via ORAL
  Filled 2018-09-16 (×5): qty 2

## 2018-09-16 MED ORDER — POTASSIUM CHLORIDE CRYS ER 20 MEQ PO TBCR
40.0000 meq | EXTENDED_RELEASE_TABLET | Freq: Once | ORAL | Status: AC
  Administered 2018-09-16: 40 meq via ORAL
  Filled 2018-09-16: qty 2

## 2018-09-16 MED ORDER — FUROSEMIDE 10 MG/ML IJ SOLN
120.0000 mg | Freq: Two times a day (BID) | INTRAVENOUS | Status: DC
  Administered 2018-09-16 – 2018-09-18 (×6): 120 mg via INTRAVENOUS
  Filled 2018-09-16: qty 12
  Filled 2018-09-16: qty 10
  Filled 2018-09-16 (×3): qty 12
  Filled 2018-09-16 (×2): qty 10

## 2018-09-16 MED ORDER — HEPARIN (PORCINE) IN NACL 100-0.45 UNIT/ML-% IJ SOLN
1600.0000 [IU]/h | INTRAMUSCULAR | Status: DC
  Administered 2018-09-16: 1600 [IU]/h via INTRAVENOUS
  Filled 2018-09-16: qty 250

## 2018-09-16 MED ORDER — POTASSIUM CHLORIDE CRYS ER 20 MEQ PO TBCR: 40.0000 meq | EXTENDED_RELEASE_TABLET | Freq: Once | ORAL | Status: DC

## 2018-09-16 MED FILL — Lidocaine HCl Local Preservative Free (PF) Inj 1%: INTRAMUSCULAR | Qty: 30 | Status: AC

## 2018-09-16 NOTE — Plan of Care (Signed)
Fluid removed from lungs today 1600cc, pt tolerated well.  Wean from O2, encouraged patient o to ambulate in hall with standby assist.  Wound care in to see patient, chair pad ordered and patient encouraged to change position often

## 2018-09-16 NOTE — Procedures (Signed)
Thoracentesis Procedure Note  Pre-operative Diagnosis: right pleural effusion   Post-operative Diagnosis: same  Indications: evaluation of pleural fluid and treatment of dyspnea   Procedure Details  Consent: Informed consent was obtained. Risks of the procedure were discussed including: infection, bleeding, pain, pneumothorax.  Under sterile conditions the patient was positioned. Betadine solution and sterile drapes were utilized.  1% buffered lidocaine was used to anesthetize the  rib space which was evaluated by real time US. Fluid was obtained without any difficulties and minimal blood loss.  A dressing was applied to the wound and wound care instructions were provided.   Findings 1600 ml of clear pleural fluid was obtained. A sample was sent to Pathology for cytogenetics, flow, and cell counts, as well as for infection analysis.  Complications:  None; patient tolerated the procedure well.          Condition: stable  Plan A follow up chest x-ray was ordered. Bed Rest for 0 hours. Tylenol 650 mg. for pain.  Jose MartinetPeter E Devyn Sheerin ACNP-BC [facility]/Critical Care Pager # [phone] OR # [phone] if no answer

## 2018-09-16 NOTE — Progress Notes (Signed)
Elink notified, patient had 6 second run of Vtach, rate in the 120's. Patient converted on his own, ICD did not fire. Sending AM labs early, and added serum magnesium per Elink. Will notify Cardiology with results.

## 2018-09-16 NOTE — Progress Notes (Signed)
[facility]iology fellow p[facility] 6 second run of Vtach and K of 3.3. Per Carncelli 40 mEq of K PO. Orders placed.

## 2018-09-16 NOTE — Progress Notes (Signed)
ANTICOAGULATION CONSULT NOTE - Initial Consult  Pharmacy Consult for Heparin Indication: LV thrombus  No Known Allergies  Patient Measurements: Height: 5\' 9"  (175.3 cm) Weight: 200 lb 6.4 oz (90.9 kg) IBW/kg (Calculated) : 70.7 HEPARIN DW (KG): 88.6   Vital Signs: Temp: 97.6 F (36.4 C) (09/19 0730) Temp Source: Oral (09/19 0730) BP: 110/82 (09/19 0700) Pulse Rate: 79 (09/19 0700)  Labs: Recent Labs    09/14/18 0545 07-16-18 0609 07-16-18 1427 07-16-18 2001 09/16/18 0233  HGB 12.3* 11.4*  --   --  10.7*  HCT 38.4* 35.0*  --   --  33.1*  PLT 222 204  --   --  176  LABPROT 52.7* 42.4* 43.3* 30.0* 22.4*  INR 5.94* 4.50* 4.62* 2.89 1.98  CREATININE 2.83* 3.16*  --   --  2.87*    Estimated Creatinine Clearance: 29 mL/min (A) (by C-G formula based on SCr of 2.87 mg/dL (H)).   Medical History: Past Medical History:  Diagnosis Date  . Coronary artery disease   . Diabetes mellitus without complication (HCC)   . Hypertension     Medications:  Scheduled:  . aspirin EC  81 mg Oral Daily  . atorvastatin  40 mg Oral q1800  . glimepiride  4 mg Oral BID WC  . insulin aspart  0-5 Units Subcutaneous QHS  . insulin aspart  0-9 Units Subcutaneous TID WC  . multivitamin with minerals  1 tablet Oral Daily  . potassium chloride  40 mEq Oral BID  . sertraline  50 mg Oral Daily  . sodium chloride flush  3 mL Intravenous Q12H   Infusions:  . sodium chloride    . ceFEPime (MAXIPIME) IV Stopped (07-16-18 1826)  . furosemide 120 mg (09/16/18 0820)  . milrinone 0.25 mcg/kg/min (09/16/18 0700)    Assessment: Pt is a Jose Myers with LV thrombus, on warfarin PTA. Home dose 2 mg daily. INR elevated during admission, peaked at 5.94. Vit K 2 mg po and 10 mg IV were given yesterday to reverse INR for thoracentesis. Pharmacy consulted to start heparin when INR < 1.8.  INR 1.98 today. Will not start heparin.   Goal of Therapy:  Heparin level 0.3-0.7 units/ml Monitor platelets by  anticoagulation protocol: Yes   Plan:  Start heparin when INR < 1.8 Check anti-Xa level once heparin starts  Continue to monitor H&H and platelets  Marcelino FreestoneEmily Madlynn Lundeen, PharmD PGY2 Cardiology Pharmacy Resident Phone [phone] 09/16/2018 9:47 AM

## 2018-09-16 NOTE — Consult Note (Signed)
NAME:  Jose Myers, MRN:  [id number], DOB:  1956-07-13, LOS: 2 ADMISSION DATE:  09/03/2018, CONSULTATION DATE:  30-Sep-2018 REFERRING MD:  Dr. Gala Romney, CHIEF COMPLAINT:  SOB  Brief History   62 year old male with PMH of HTN, CAD (NSTEMI 08/15/2018), VT arrest 08/20/2018 with post arrest TTE showing LV thrombus on coumadin, DMT2, CKD stage III- IV admitted with AKI secondary to nausea, vomiting, and diarrhea while taking metformin.  Continued unclear nausea and vomiting.  Worsening hypervolemia, dyspnea, cough, and orthopnea.  Empiric treatment for HCAP starting on 9/16. Worsening symptomatic dyspnea, NP cough, orthopnea, and hypervolemia with increasing bilateral pleural effusions.  INR elevated, being reversed.  RHC 9/18 confirmed cardiogenic shock.  Tx to ICU to be started on milrinone and aggressive diuresis.  PCCM consulted for therapeutic thoracentesis and possible pleurex catheter placement.   Significant Hospital Events   9/14 Admit 9/18 RHC; tx to ICU 9/19 patient is sitting at the side of the bed he is on milrinone feeling better still on nasal cannula denies any chest pain or shortness of breath.  Patient was given vitamin K yesterday since INR is improving.  He did have a run of NSVT overnight.  Consults: date of consult/date signed off & final recs:  HF team 9/5 Psychiatry 9/15 PCCM 9/18  Procedures (surgical and bedside):  30-Sep-2018 RHC  Significant Diagnostic Tests:  Echo 09/12/18 - Left ventricle: The cavity size was severely dilated. Systolic   function was severely reduced. The estimated ejection fraction   was in the range of 20% to 25%. - Regional wall motion abnormality: Akinesis of the mid-apical   anterior, mid anteroseptal, mid inferoseptal, apical septal, and   apical myocardium; moderate hypokinesis of the mid anterolateral   and apical lateral myocardium. This is consistent with infarction   in the LAD territory. - Mitral valve: There was severe  regurgitation. - Atrial septum: No defect or patent foramen ovale was identified. - Tricuspid valve: There was mild-moderate regurgitation. - Pulmonic valve: There was trivial regurgitation. - Pulmonary arteries: Systolic pressure was severely increased. PA   peak pressure: 60 mm Hg (S). - Pericardium, extracardiac: There was a left pleural effusion. Impressions: - Severe LV dysfunction with wall motion abnormalities consistent   with infarct in LAD territory. Severe MR. Elevated right sided   filling pressures. Similar to prior study.  CT abd/pelvis 09/27/2018 1. Fluid within mildly distended stomach, may be related to p.o. intake. No evidence of gastric inflammation or wall thickening to suggest gastritis. 2. Mild hepatic steatosis. High-density gallbladder contents may be sludge or stones. No gallbladder inflammation. 3. Small to moderate right and small left pleural effusion in the included lung bases, similar to slightly increased from CT 1 month ago. 4. Aortic Atherosclerosis (ICD10-I70.0).  AXR 09/13/18  No evidence of bowel obstruction or ileus  RHC 2018-09-30 >> RA = 4 RV = 48/6 PA = 53/22 (37) PCW = 31 (v waves to 40)  Fick cardiac output/index =3.2/1.5 PVR = 2.0 WU Ao sat = 95% PA sat = 38%, 40%  Micro Data:  Antimicrobials:  9/16 cefepime >> 9/16 vancomycin >>  Subjective:  Patient c/o of SOB and NP cough at rest, ask for fluid to be taken off.  Objective   Blood pressure 110/82, pulse 79, temperature 97.6 F (36.4 C), temperature source Oral, resp. rate 19, height 5\' 9"  (1.753 m), weight 90.9 kg, SpO2 96 %.        Intake/Output Summary (Last 24 hours) at 09/16/2018  1610 Last data filed at 09/16/2018 0700 Gross per 24 hour  Intake 682.08 ml  Output 1250 ml  Net -567.92 ml   Filed Weights   09/14/18 0527 09/07/2018 0545 09/16/18 0500  Weight: 88.9 kg 90.4 kg 90.9 kg    Examination: General:  Chronically ill appearing male sitting on the chair  HEENT:  MM pink/moist, +JVD Neuro: Alert, oriented, MAE CV: RRR, no murmur, dressing to left upper chest PULM: Decreased air sound bilaterally no crackles no wheezing RU:EAVW, slight distended, non-tender, bs active  Extremities: cool/dry, mild edema Skin: no rashes   Resolved Hospital Problem list    Assessment & Plan:   Acute on chronic hypoxic respiratory failure secondary to chronic Bilateral Pleural Effusions most likely related to chronic systolic heart failure - Right greater than left by CXR - 08/27/18, IR Right thoracentesis, removed 1.4 L  -  09/03/18- Right thoracentesis by IR, 1.7 L of light amber fluid -  09/04/18-  Left thoracentesis by IR, 800 ml of clear yellow fluid removed off;  transudative by Lights criteria P:  - Patient oxygenation and respiratory status has improved with starting milrinone and diuresis  -Continue scheduled IV Lasix  -Keep patient in negative balance  - maximize medical management per HF team as below with milrinone/ diuresis  -We will attempt a right thoracentesis today and continue maximizing medical management of his heart failure but if continues to fail and re-accumulates we will consider a Pleurx as a last resort which can be done by thoracic surgery or interventional radiology  -Supplemental O2 prn    Cardiogenic Shock secondary acute on chronic systolic HF/ ICM - TTE 9/15 LVEF 20-25%, severe MR, mild/mod TI, PAP 60 mmHg - not a candidate for CABG or PCI per last admission P:  per HF team  -Continue milrinone and titrate -Continue IV Lasix twice daily   R/o PNA, left lower opacification  -Suspected aspiration given numerous vomiting -Continue on vanc/ cefepime 9/16 P:  -Procalcitonin is very low less likely to be an pneumonia  -We will continue cefepime for now though due to his severe illness and have low threshold for de-escalation soon -I will discontinue vancomycin since MRSA negative    AKI, hx of CKD, stage III- IV P:  Diuresis  per HF Trending daily wts/ strict I/Os/ BMETs  LV thrombus - on coumadin P:  Hold coumadin   I have spent 32 minutes of critical care time this time was spent bedside or in the unit this time was exclusive of any billable procedures patient is needing intensive care unit due to cardiogenic shock requiring inotropes titration  Disposition / Summary of Today's Plan 09/16/18   Diuresis/ inotrope per HF team     Diet: NPO for now if no intervention by cardiology  Pain/Anxiety/Delirium protocol (if indicated): n/a VAP protocol (if indicated) n/a DVT prophylaxis: systemically supratherapeutic  GI prophylaxis: not indicated Hyperglycemia protocol: SSI Mobility: bedrest Code Status: Full Family Communication: patient updated on plan of care   Labs   CBC: Recent Labs  Lab 09/12/18 0253 09/13/18 1116 09/14/18 0545 09/06/2018 0609 09/16/18 0233  WBC 13.9* 14.7* 11.8* 9.9 8.6  HGB 13.1 12.7* 12.3* 11.4* 10.7*  HCT 42.1 39.5 38.4* 35.0* 33.1*  MCV 89.6 86.6 86.5 86.2 86.2  PLT 239 223 222 204 176   Basic Metabolic Panel: Recent Labs  Lab 09/12/18 0253 09/13/18 1116 09/14/18 0545 09/03/2018 0609 09/16/18 0233  NA 134* 133* 133* 133* 134*  K 4.5 4.8 4.4 4.2  3.3*  CL 97* 94* 95* 95* 98  CO2 21* 25 25 25 26   GLUCOSE 195* 212* 176* 146* 136*  BUN 34* 39* 43* 49* 47*  CREATININE 2.98* 2.98* 2.83* 3.16* 2.87*  CALCIUM 8.8* 8.8* 8.9 8.4* 8.2*  MG  --   --   --   --  2.2   GFR: Estimated Creatinine Clearance: 29 mL/min (A) (by C-G formula based on SCr of 2.87 mg/dL (H)). Recent Labs  Lab 09/12/18 0057 09/12/18 0214  09/13/18 1116 09/14/18 0545 09/24/2018 0609 09/17/2018 1724 09/16/18 0233  PROCALCITON  --   --   --  <0.10  --   --  <0.10  --   WBC  --   --    < > 14.7* 11.8* 9.9  --  8.6  LATICACIDVEN 2.36* 1.69  --   --   --   --   --   --    < > = values in this interval not displayed.   Liver Function Tests: Recent Labs  Lab 09/09/18 1433 09/12/18 0253  AST 26 35    ALT 17 23  ALKPHOS 66 65  BILITOT 1.2 1.3*  PROT 6.2* 6.2*  ALBUMIN 3.0* 3.3*   Recent Labs  Lab 09/09/18 1433  LIPASE 65*  AMYLASE 51   No results for input(s): AMMONIA in the last 168 hours. ABG    Component Value Date/Time   PHART 7.379 08/22/2018 0822   PCO2ART 33.1 08/22/2018 0822   PO2ART 65.0 (L) 08/22/2018 0822   HCO3 21.4 09/23/2018 1414   HCO3 26.2 09/19/2018 1414   TCO2 22 09/12/2018 1414   TCO2 27 09/13/2018 1414   ACIDBASEDEF 3.0 (H) 09/14/2018 1414   O2SAT 40.0 09/23/2018 1414   O2SAT 38.0 09/13/2018 1414    Coagulation Profile: Recent Labs  Lab 09/14/18 0545 09/05/2018 0609 09/01/2018 1427 09/06/2018 2001 09/16/18 0233  INR 5.94* 4.50* 4.62* 2.89 1.98   Cardiac Enzymes: Recent Labs  Lab 09/17/2018 2318  TROPONINI 0.43*   HbA1C: Hgb A1c MFr Bld  Date/Time Value Ref Range Status  08/22/2018 03:37 AM 8.2 (H) 4.8 - 5.6 % Final    Comment:    (NOTE) Pre diabetes:          5.7%-6.4% Diabetes:              >6.4% Glycemic control for   <7.0% adults with diabetes   08/21/2018 03:40 AM 8.2 (H) 4.8 - 5.6 % Final    Comment:    (NOTE) Pre diabetes:          5.7%-6.4% Diabetes:              >6.4% Glycemic control for   <7.0% adults with diabetes    CBG: Recent Labs  Lab 09/26/2018 0737 09/06/2018 1154 09/01/2018 1655 09/26/2018 2152 09/16/18 0654  GLUCAP 121* 296* 123* 145* 109*    Admitting History of Present Illness.   62 year old male with PMH of HTN, CAD (NSTEMI 08/15/2018), VT arrest 08/20/2018 with post arrest TTE showing LV thrombus on coumadin, DMT2, CKD stage III- IV admitted 9/14 with 2 day history of nausea, vomiting, and diarrhea.  His amiodarone was held, given zofran, and metformin stopped after increase in sCR by HF but patient did not have symptomatic improvement and [facility]  Diarrhea resolved after stopping metformin, however patient has continued to have nausea and vomiting of unclear etiology; negative AXR.   Patient with increasing leukocytosis and developing dyspnea and  orthopnea.  He was started on empiric cefepime and vancomycin 9/16. Diuretics were held given his AKI, and patient became volume overloaded with increased bilateral pleural effusions and developing non productive cough with orthopnea and dyspnea at rest.  Additionally, his INR has continued to rise since 9/14; LFTs were grossly normal 9/15.  Patient taken for diagnostic right heart cath today which confirmed cardiogenic shock.  Patient to return to ICU to be started on milrinone and aggressive diuresis.  PCCM consulted for additional therapeutic thoracentesis and possible placement of pleurex catheter given his ongoing pleural effusions.    On chart review, patient had right thoracentesis on 08/27/18 by IR Right thoracentesis with 1.4L removed.  Additional Right thoracentesis on 9/6 by IR with1.7 L of light amber fluid removed.  On  09/04/18, patient underwent Left thoracentesis by IR, with removal of 800 ml of clear yellow fluid which was transudative by Lights criteria.   Review of Systems:   POSITIVES IN BOLD Gen: Denies fever, chills, weight change, fatigue, night sweats HEENT: Denies vision changes, sinus congestion, rhinorrhea, dysphagia PULM: Denies shortness of breath, cough, sputum production, hemoptysis, wheezing CV: Denies chest pain, edema, orthopnea,  palpitations GI: Denies abdominal pain, nausea, vomiting, change in bowel habits Derm: Denies rash or skin change Heme: Denies easy bruising, bleeding, bleeding gums Neuro: Denies headache, numbness, weakness, slurred speech, loss of memory or consciousness  Past medical history  He,  has a past medical history of Coronary artery disease, Diabetes mellitus without complication (HCC), and Hypertension.   Surgical History    Past Surgical History:  Procedure Laterality Date  . CARDIAC CATHETERIZATION    . CORONARY ANGIOGRAPHY N/A 08/19/2018   Procedure: CORONARY ANGIOGRAPHY  (CATH LAB);  Surgeon: Lyn Records, MD;  Location: [facility] INVASIVE CV LAB;  Service: Cardiovascular;  Laterality: N/A;  . EYE SURGERY    . ICD IMPLANT N/A 08/31/2018   Procedure: ICD IMPLANT;  Surgeon: Marinus Maw, MD;  Location: [facility] INVASIVE CV LAB;  Service: Cardiovascular;  Laterality: N/A;  . IR THORACENTESIS ASP PLEURAL SPACE W/IMG GUIDE  08/27/2018  . IR THORACENTESIS ASP PLEURAL SPACE W/IMG GUIDE  09/03/2018  . PERCUTANEOUS CORONARY STENT INTERVENTION (PCI-S)  04/11/2003   LAD  . RIGHT HEART CATH N/A 08/19/2018   Procedure: RIGHT HEART CATH;  Surgeon: Lyn Records, MD;  Location: [facility] INVASIVE CV LAB;  Service: Cardiovascular;  Laterality: N/A;  . RIGHT HEART CATH N/A 09/02/2018   Procedure: RIGHT HEART CATH;  Surgeon: Dolores Patty, MD;  Location: [facility] INVASIVE CV LAB;  Service: Cardiovascular;  Laterality: N/A;     Social History   Social History   Socioeconomic History  . Marital status: Married    Spouse name: Not on file  . Number of children: Not on file  . Years of education: Not on file  . Highest education level: Not on file  Occupational History  . Not on file  Social Needs  . Financial resource strain: Not on file  . Food insecurity:    Worry: Not on file    Inability: Not on file  . Transportation needs:    Medical: Not on file    Non-medical: Not on file  Tobacco Use  . Smoking status: Never Smoker  . Smokeless tobacco: Never Used  Substance and Sexual Activity  . Alcohol use: Yes    Comment: Occasionally.  . Drug use: Not on file  . Sexual activity: Not on file  Lifestyle  . Physical activity:  Days per week: Not on file    Minutes per session: Not on file  . Stress: Not on file  Relationships  . Social connections:    Talks on phone: Not on file    Gets together: Not on file    Attends religious service: Not on file    Active member of club or organization: Not on file    Attends meetings of clubs or organizations: Not on file     Relationship status: Not on file  . Intimate partner violence:    Fear of current or ex partner: Not on file    Emotionally abused: Not on file    Physically abused: Not on file    Forced sexual activity: Not on file  Other Topics Concern  . Not on file  Social History Narrative  . Not on file  ,  reports that he has never smoked. He has never used smokeless tobacco. He reports that he drinks alcohol.   Family history   His family history includes Diabetes Mellitus II in his mother; Throat cancer in his brother.   Allergies No Known Allergies  Home meds  Prior to Admission medications   Medication Sig Start Date End Date Taking? Authorizing Provider  Apple Cid Vn-Grn Tea-Bit Or-Cr (APPLE CIDER VINEGAR PLUS) TABS Take 1 tablet by mouth daily.   Yes [provider]  aspirin 81 MG EC tablet Take 1 tablet (81 mg total) by mouth daily. 09/07/18  Yes Alford Highland, NP  atorvastatin (LIPITOR) 40 MG tablet Take 1 tablet (40 mg total) by mouth daily at 6 PM. 09/06/18  Yes Alford Highland, NP  carvedilol (COREG) 6.25 MG tablet Take 0.5 tablets (3.125 mg total) by mouth 2 (two) times daily with a meal. 09/06/18  Yes Alford Highland, NP  docusate sodium (COLACE) 100 MG capsule Take 1 capsule (100 mg total) by mouth 2 (two) times daily as needed for mild constipation. 09/06/18  Yes Alford Highland, NP  glimepiride (AMARYL) 4 MG tablet Take 4 mg by mouth 2 (two) times daily.   Yes [provider]  LORazepam (ATIVAN) 1 MG tablet Take 0.5-1 mg by mouth daily as needed for anxiety.   Yes [provider]  Multiple Vitamin (MULTIVITAMIN WITH MINERALS) TABS tablet Take 1 tablet by mouth daily. 09/07/18  Yes Alford Highland, NP  Omega 3 340 MG CPDR Take 1 capsule by mouth at bedtime.   Yes [provider]  ondansetron (ZOFRAN-ODT) 4 MG disintegrating tablet Take 1 tablet (4 mg total) by mouth every 8 (eight) hours as needed for nausea or vomiting. 09/09/18  Yes Bensimhon, Bevelyn Buckles, MD  polyethylene glycol (MIRALAX / GLYCOLAX) packet Take 17 g by mouth daily as needed for moderate constipation. 09/06/18  Yes Alford Highland, NP  potassium chloride SA (K-DUR,KLOR-CON) 20 MEQ tablet Take 3 tablets (60 mEq total) by mouth daily. 09/09/18  Yes Bensimhon, Bevelyn Buckles, MD  sertraline (ZOLOFT) 50 MG tablet Take 25 mg by mouth daily. 07/13/18  Yes [provider]  torsemide (DEMADEX) 20 MG tablet Take 3 tablets (60 mg total) by mouth daily. 09/07/18  Yes Alford Highland, NP  traZODone (DESYREL) 50 MG tablet Take 50 mg by mouth at bedtime as needed for sleep.  08/13/18  Yes [provider]  warfarin (COUMADIN) 2 MG tablet Take 1 tablet (2 mg total) by mouth daily. 09/06/18 09/06/19 Yes Alford Highland, NP  feeding supplement, GLUCERNA SHAKE, (GLUCERNA SHAKE)  LIQD Take 237 mLs by mouth 2 (two) times daily between meals. Patient not taking: Reported on 09/12/2018 09/06/18   Alford Highland, NP     [facility]  [phone] 09/16/2018, 9:21 AM

## 2018-09-16 NOTE — Progress Notes (Signed)
Advanced Heart Failure Rounding Note  PCP-Cardiologist: [name], MD   Subjective:    Underwent RHC on 9/19 due to worsening SOB and AKI.  Showed low output with markedly elevated filling pressures. Started on milrinone and IV lasix.   Urine output now picking up nicely but up about a pound. Breathing better this am. Still mild orthopnea  Received high-dose vitamin K last night. Creatinine 3.1-> 2.8. INR 1.98 . Had NSVT overnight and k supped.      Echo 09/12/18 - LVEF 20-25%, Severe MR, Mild Mod TI, Trivial PI, PA peak pressure 60 mm Hg    Objective:    Weight Range: 90.9 kg Body mass index is 29.59 kg/m.   Vital Signs:   Temp:  [97.4 F (36.3 C)-98.2 F (36.8 C)] 97.5 F (36.4 C) (09/19 0342) Pulse Rate:  [0-137] 79 (09/19 0600) Resp:  [0-39] 21 (09/19 0600) BP: (93-124)/(67-91) 105/72 (09/19 0600) SpO2:  [0 %-100 %] 94 % (09/19 0600) Weight:  [90.9 kg] 90.9 kg (09/19 0500) Last BM Date: 09/02/2018  Weight change: Filed Weights   09/14/18 0527 09/02/2018 0545 09/16/18 0500  Weight: 88.9 kg 90.4 kg 90.9 kg   Intake/Output:   Intake/Output Summary (Last 24 hours) at 09/16/2018 0703 Last data filed at 09/16/2018 0600 Gross per 24 hour  Intake 675.29 ml  Output 900 ml  Net -224.71 ml    Physical Exam    General:  Sitting up in bed. NAD HEENT: normal Neck: supple. JVP 8-9  Carotids 2+ bilat; no bruits. No lymphadenopathy or thryomegaly appreciated. Cor: PMI nondisplaced. Regular rate & rhythm. No rubs, gallops or murmurs. Lungs: decreased BS 1/2 up bilaterally worse on right  Abdomen: soft, nontender, + distended. No hepatosplenomegaly. No bruits or masses. Good bowel sounds. Extremities: no cyanosis, clubbing, rash, 1-2+ edema Neuro: alert & orientedx3, cranial nerves grossly intact. moves all 4 extremities w/o difficulty. Affect pleasant   Telemetry   NSR 70-80s.Personally reviewed   EKG    No new tracings.    Labs    CBC Recent  Labs    09/09/2018 0609 09/16/18 0233  WBC 9.9 8.6  HGB 11.4* 10.7*  HCT 35.0* 33.1*  MCV 86.2 86.2  PLT 204 176   Basic Metabolic Panel Recent Labs    16/10/96 0609 09/16/18 0233  NA 133* 134*  K 4.2 3.3*  CL 95* 98  CO2 25 26  GLUCOSE 146* 136*  BUN 49* 47*  CREATININE 3.16* 2.87*  CALCIUM 8.4* 8.2*  MG  --  2.2   Liver Function Tests No results for input(s): AST, ALT, ALKPHOS, BILITOT, PROT, ALBUMIN in the last 72 hours. No results for input(s): LIPASE, AMYLASE in the last 72 hours. Cardiac Enzymes No results for input(s): CKTOTAL, CKMB, CKMBINDEX, TROPONINI in the last 72 hours. BNP: BNP (last 3 results) Recent Labs    08/15/18 1649 08/20/18 1258 09/12/2018 2318  BNP 930.7* 1,184.0* 1,989.1*   ProBNP (last 3 results) No results for input(s): PROBNP in the last 8760 hours.  D-Dimer No results for input(s): DDIMER in the last 72 hours. Hemoglobin A1C No results for input(s): HGBA1C in the last 72 hours. Fasting Lipid Panel No results for input(s): CHOL, HDL, LDLCALC, TRIG, CHOLHDL, LDLDIRECT in the last 72 hours. Thyroid Function Tests No results for input(s): TSH, T4TOTAL, T3FREE, THYROIDAB in the last 72 hours.  Invalid input(s): FREET3  Other results:  Imaging   Dg Chest Port 1 View  Result Date: 09/17/2018 CLINICAL DATA:  Pleural effusion. EXAM: PORTABLE CHEST 1 VIEW COMPARISON:  Chest radiograph September 14, 2018 FINDINGS: Similar moderate pleural effusions and underlying consolidation. Stable cardiomegaly. Calcified aortic arch. Low inspiratory examination. Dual lead LEFT AICD in situ. No pneumothorax. Soft tissue planes and included osseous structures are unchanged. Postoperative changes distal LEFT clavicle. IMPRESSION: 1. Low inspiratory examination. Stable moderate pleural effusions and underlying consolidation. 2. Stable cardiomegaly. 3.  Aortic Atherosclerosis (ICD10-I70.0). Electronically Signed   By: Awilda Metro M.D.   On: 09/16/2018 17:43     Medications:    Scheduled Medications: . aspirin EC  81 mg Oral Daily  . atorvastatin  40 mg Oral q1800  . glimepiride  4 mg Oral BID WC  . insulin aspart  0-5 Units Subcutaneous QHS  . insulin aspart  0-9 Units Subcutaneous TID WC  . multivitamin with minerals  1 tablet Oral Daily  . sertraline  50 mg Oral Daily  . sodium chloride flush  3 mL Intravenous Q12H    Infusions: . sodium chloride    . ceFEPime (MAXIPIME) IV Stopped (08/31/2018 1826)  . milrinone 0.25 mcg/kg/min (09/16/18 0600)  . vancomycin Stopped (09/03/2018 1933)    PRN Medications: sodium chloride, acetaminophen **OR** acetaminophen, bisacodyl, docusate sodium, LORazepam, ondansetron (ZOFRAN) IV, polyethylene glycol, sodium chloride flush, traMADol, traZODone  Patient Profile   Jose Myers is a 62 y.o. 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  Admitted with abdominal pain, N/V, and AKI.   Assessment/Plan   1.Acute on chronic systolic CHF: -Ischemic cardiomyopathy,Echo 8/23/19EF 25-30% by echo. - Echo 09/12/18 LVEF 20-25%, Severe MR, Mild Mod TI, Trivial PI, PA peak pressure 60 mm Hg - RHC on 9/18 with low output/shock and elevated filling pressures - Milrinone started 9/18. Diuresis now picking up. Continue lasix 120 IV bid. Supp k - No bblocker or ARB due to CKD and shock - Advanced therapies limited by CKD - Will likely need central access to follow.   2. Bilateral Pleural effusions R>L - With multiple recent thoracentesis, Most recent 09/04/18 with 800 mL off R side.  - CXR with worsening effusions. Given recurrence may be best to ask TCTS for Pleurex at least on R. Will d/w CCM. INR now down - Continue diuresis   3. Acute hypoxemic respiratory failure - due to #1 and #2 - continue diuresis. Will need thora +/- Pleurex  4. Acute on  CKD III-IV:  - Baseline Cr appears to be 2.4 - 2.6 - Cr up to  3.16 yesterday now 2.8 with inotropic support  5. Severe multivessel CAD: -Not thought to be CABG candidate.Interventional cardiology reviewed films at length 08/24/18 and felt not to be candidate for PCI.  - No s/s ischemia - Continue ASA/statin. - Also on coumadin for LV thrombus and ?CVA.  6. LV Thrombus: - INR > 5 yesterday. Has been reversed for thora.  - Start heparin when INR < 1.8  7. 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 NO change.   8. DM2: - Per primary. No change.  - Off metformin due to GI side effect - consider gastric emptying study to evaluate for gastroparesis (admitted with ab pain)9. NSVT  9. NSVT - off amio due to intolerance (GI) - Keep K > 4.0 Mg > 2.0   10. Hypokalemia - will supp  CRITICAL CARE Performed by: Arvilla Meres  Total critical care time: 35 minutes  Critical care time was exclusive of  separately billable procedures and treating other patients.  Critical care was necessary to treat or prevent imminent or life-threatening deterioration.  Critical care was time spent personally by me (independent of midlevel providers or residents) on the following activities: development of treatment plan with patient and/or surrogate as well as nursing, discussions with consultants, evaluation of patient's response to treatment, examination of patient, obtaining history from patient or surrogate, ordering and performing treatments and interventions, ordering and review of laboratory studies, ordering and review of radiographic studies, pulse oximetry and re-evaluation of patient's condition.    Length of Stay: 2  Arvilla Meresaniel Bensimhon, MD  09/16/2018, 7:03 AM  Advanced Heart Failure Team Pager [phone] (M-F; 7a - 4p)  Please contact [facility] for night-coverage after hours (4p -7a ) and weekends on [email or web address]

## 2018-09-16 NOTE — Progress Notes (Addendum)
Inpatient Diabetes Program Recommendations  AACE/ADA: New Consensus Statement on Inpatient Glycemic Control (2015)  Target Ranges:  Prepandial:   less than 140 mg/dL      Peak postprandial:   less than 180 mg/dL (1-2 hours)      Critically ill patients:  140 - 180 mg/dL   Lab Results  Component Value Date   GLUCAP 109 (H) 09/16/2018   HGBA1C 8.2 (H) 08/22/2018    Review of Glycemic Control Results for Hayden RasmussenSCONTRIAS, Dontrelle L (MRN [id number]) as of 09/16/2018 10:02  Ref. Range 09/14/2018 11:54 09/14/2018 16:55 09/08/2018 21:52 09/16/2018 06:54  Glucose-Capillary Latest Ref Range: 70 - 99 mg/dL 132296 (H) 440123 (H) 102145 (H) 109 (H)   Diabetes history:DM2 Outpatient Diabetes medications:Amaryl 4 mg bid Current orders for Inpatient glycemic control:Novolog 0-9units TID with meals, Novolog 0-5 units QHS, Amaryl 4 mg BID  Inpatient Diabetes Program Recommendations:  Given elevated postprandials, consider meal coverage once diet resumes. Recommending Novolog 4 units of meal coverage with large meal of day, appears to be lunch (assuming that patient consumes >50% of mea).  Per cards, noted consideration for Jardiance, however, it is not recommended initiating when GFR <45. Therefore, could consider Tradjenta 5 mg QD and follow up with PCP.  Thanks, Lujean RaveLauren Amiah Frohlich, MSN, RNC-OB Diabetes Coordinator [phone] (8a-5p)

## 2018-09-16 NOTE — Consult Note (Signed)
WOC Nurse wound consult note [facility].  No family present.  Patient had a sacral foam dressing in place at the time of my assessment.  I removed it. Reason for Consult: Sacral wound Wound type: This tissue impairment is related to moisture and pressure.  The patient endorses sitting either in a hospital recliner, or his home recliner after his last hospitalization.  He also endorses that the wound is tender when touched. Injury POA: Yes, to this unit Measurement: 2 cm x 1 cm x no appreciable depth Wound bed: White, macerated along the upper gluteal fold with a small hole too small to get a cotton tipped applicator head into.  Wound margins are hardened and tender to touch.  The patient states this area was tender to touch when he was between this hospitalization and the last. Drainage (amount, consistency, odor), Small yellowish drainage on the foam dressing.  No odor, no induration. Periwound: Normal skin color and texture. Dressing procedure/placement/frequency: Place a piece of Aquacel Ag+ [place] [id number]) into gluteal fold, cover with gauze, tape in place.  Change daily.  Additionally, I have ordered a chair cushion for use. Monitor the wound area(s) for worsening of condition such as: Signs/symptoms of infection,  Increase in size,  Development of or worsening of odor, Development of pain, or increased pain at the affected locations.  Notify the medical team if any of these develop.  Thank you for the consult.  Discussed plan of care with the patient and bedside nurse.  WOC nurse will not follow at this time.  Please re-consult the WOC team if needed.  Helmut MusterSherry Kody Brandl, RN, MSN, CWOCN, CNS-BC, pager [phone]

## 2018-09-16 NOTE — Progress Notes (Addendum)
ANTICOAGULATION CONSULT NOTE  Pharmacy Consult for Heparin Indication: LV thrombus  No Known Allergies  Patient Measurements: Height: 5\' 9"  (175.3 cm) Weight: 200 lb 6.4 oz (90.9 kg) IBW/kg (Calculated) : 70.7 HEPARIN DW (KG): 88.6  Vital Signs: Temp: 97.4 F (36.3 C) (09/19 1630) Temp Source: Oral (09/19 1630) BP: 101/73 (09/19 1600) Pulse Rate: 86 (09/19 1600)  Labs: Recent Labs    09/14/18 0545 09/17/2018 0609  09/14/2018 2001 09/16/18 0233 09/16/18 1615  HGB 12.3* 11.4*  --   --  10.7*  --   HCT 38.4* 35.0*  --   --  33.1*  --   PLT 222 204  --   --  176  --   LABPROT 52.7* 42.4*   < > 30.0* 22.4* 18.3*  INR 5.94* 4.50*   < > 2.89 1.98 1.53  CREATININE 2.83* 3.16*  --   --  2.87*  --    < > = values in this interval not displayed.    Estimated Creatinine Clearance: 29 mL/min (A) (by C-G formula based on SCr of 2.87 mg/dL (H)).   Assessment: [age] yoM with LV thrombus, on warfarin PTA. Home dose 2 mg daily. INR elevated during admission, peaked at 5.94. Vit K 2 mg po and 10 mg IV were given yesterday to reverse INR for thoracentesis. Pharmacy consulted to start heparin when INR < 1.8.  INR this afternoon is sub-therapeutic; therefore, will initiate IV heparin.  No bleeding documented.  Patient was previously therapeutic on 1600 units/hr.  Goal of Therapy:  Heparin level 0.3-0.7 units/ml Monitor platelets by anticoagulation protocol: Yes   Plan:  Start heparin gtt at 1600 units/hr, no bolus Check 8 hr heparin level Daily PT/INR, heparin level and CBC Monitor closely for s/sx of bleeding  Maximiliano Cromartie D. Laney Potashang, PharmD, BCPS, BCCCP 09/16/2018, 5:17 PM

## 2018-09-17 DIAGNOSIS — R570 Cardiogenic shock: Secondary | ICD-10-CM

## 2018-09-17 LAB — GLUCOSE, CAPILLARY
GLUCOSE-CAPILLARY: 248 mg/dL — AB (ref 70–99)
GLUCOSE-CAPILLARY: 199 mg/dL — AB (ref 70–99)
Glucose-Capillary: 126 mg/dL — ABNORMAL HIGH (ref 70–99)

## 2018-09-17 LAB — HEPARIN LEVEL (UNFRACTIONATED)
HEPARIN UNFRACTIONATED: 0.65 [IU]/mL (ref 0.30–0.70)
Heparin Unfractionated: 0.67 IU/mL (ref 0.30–0.70)

## 2018-09-17 LAB — BASIC METABOLIC PANEL
ANION GAP: 10 (ref 5–15)
BUN: 38 mg/dL — AB (ref 8–23)
CALCIUM: 7.9 mg/dL — AB (ref 8.9–10.3)
CO2: 22 mmol/L (ref 22–32)
CREATININE: 2.4 mg/dL — AB (ref 0.61–1.24)
Chloride: 100 mmol/L (ref 98–111)
GFR calc Af Amer: 31 mL/min — ABNORMAL LOW (ref >60)
GFR, EST NON AFRICAN AMERICAN: 27 mL/min — AB (ref >60)
GLUCOSE: 182 mg/dL — AB (ref 70–99)
Potassium: 4.1 mmol/L (ref 3.5–5.1)
Sodium: 132 mmol/L — ABNORMAL LOW (ref 135–145)

## 2018-09-17 LAB — PROTIME-INR
INR: 1.79
Prothrombin Time: 20.6 seconds — ABNORMAL HIGH (ref 11.4–15.2)

## 2018-09-17 MED ORDER — HEPARIN (PORCINE) IN NACL 100-0.45 UNIT/ML-% IJ SOLN
1400.0000 [IU]/h | INTRAMUSCULAR | Status: DC
  Administered 2018-09-17 – 2018-09-18 (×3): 1400 [IU]/h via INTRAVENOUS
  Filled 2018-09-17 (×2): qty 250

## 2018-09-17 MED ORDER — POLYETHYLENE GLYCOL 3350 17 G PO PACK
17.0000 g | PACK | Freq: Once | ORAL | Status: AC
  Administered 2018-09-17: 17 g via ORAL
  Filled 2018-09-17: qty 1

## 2018-09-17 NOTE — Progress Notes (Signed)
ANTICOAGULATION CONSULT NOTE  Pharmacy Consult for Heparin Indication: LV thrombus  No Known Allergies  Patient Measurements: Height: 5\' 9"  (175.3 cm) Weight: 195 lb 5.2 oz (88.6 kg) IBW/kg (Calculated) : 70.7 HEPARIN DW (KG): 88.6  Vital Signs: Temp: 98.1 F (36.7 C) (09/20 1600) Temp Source: Oral (09/20 1600) BP: 107/80 (09/20 1700) Pulse Rate: 90 (09/20 1700)  Labs: Recent Labs    Dec 25, 2018 0609  09/16/18 0233 09/16/18 1615 09/17/18 0152 09/17/18 0157 09/17/18 1020 09/17/18 1611  HGB 11.4*  --  10.7*  --   --   --   --   --   HCT 35.0*  --  33.1*  --   --   --   --   --   PLT 204  --  176  --   --   --   --   --   LABPROT 42.4*   < > 22.4* 18.3*  --  20.6*  --   --   INR 4.50*   < > 1.98 1.53  --  1.79  --   --   HEPARINUNFRC  --   --   --   --  >2.20*  --  0.67 0.65  CREATININE 3.16*  --  2.87*  --   --  2.40*  --   --    < > = values in this interval not displayed.    Estimated Creatinine Clearance: 34.3 mL/min (A) (by C-G formula based on SCr of 2.4 mg/dL (H)).   Assessment: [age] yoM with LV thrombus, on warfarin PTA. Home dose 2 mg daily. INR elevated during admission, peaked at 5.94. Vit K 2 mg po and 10 mg IV were given yesterday to reverse INR for thoracentesis. Pharmacy consulted to start heparin when INR < 1.8. -heparin level= 0.65  Goal of Therapy:  Heparin level 0.3-0.7 units/ml Monitor platelets by anticoagulation protocol: Yes   Plan:  Continue heparin 1400 units/hr Daily PT/INR, heparin level and CBC  Harland GermanAndrew Garland Hincapie, PharmD Clinical Pharmacist Please check Amion for pharmacy contact number

## 2018-09-17 NOTE — Plan of Care (Signed)
Neuro: Pt A&OX4, neuro exam WNL. Will continue to monitor.    Respiratory: Pt on 2L o2 via [place] with o2 sats WNL.   Cardiovascular: Pt afebrile, BP WNL and pt in NSR.    GI/GU: Pt voiding in urinal, on cardiac/ carb mod diet at this time. Last BM today X2.    Skin: Skin intact with no new s/s of skin breakdown at this time. Pt with PU to sacrum, but moving frequently and does not require repositioning at this time.    Pain: Pt with no reports of pain at this time.   Events: NO acute events throughout shift. Sheath removed from right brachial vein. Pt remains in milrinone and heparin. Pts plan of care to continue with current regimen, Family updated and no further questions at this time.

## 2018-09-17 NOTE — Progress Notes (Signed)
Right Venous Brachial Sheath d/c'd at 1615 per MD order.  Pressure held x5 mins.and dressed with gauze dressing.  2+ radial pulses present.  Patient tolerated process well. Trula Orehristina, RN at bedside and assessed site.

## 2018-09-17 NOTE — Progress Notes (Signed)
Advanced Heart Failure Rounding Note  PCP-Cardiologist: Jodelle RedBridgette Christopher, MD   Subjective:    Underwent RHC on 9/19 due to worsening SOB and AKI.  Showed low output with markedly elevated filling pressures. Started on milrinone and IV lasix.   Underwent right thoracentesis yesterday with 1600cc off. Right lung now clear on CXR. Left lung with small to moderate effusion. Remains on milrinone and IV lasix. Weight down another 5 pounds. Creatinine improved 2.9->2.4  Feels better. Dyspnea improved. Orthopnea resolving. No CP. Wants to walk. No BM.   Echo 09/12/18 - LVEF 20-25%, Severe MR, Mild Mod TI, Trivial PI, PA peak pressure 60 mm Hg   Objective:    Weight Range: 88.6 kg Body mass index is 28.84 kg/m.   Vital Signs:   Temp:  [97.4 F (36.3 C)-98.2 F (36.8 C)] 98.1 F (36.7 C) (09/20 0024) Pulse Rate:  [81-96] 87 (09/20 0600) Resp:  [12-28] 23 (09/20 0600) BP: (90-116)/(65-84) 103/76 (09/20 0600) SpO2:  [90 %-98 %] 95 % (09/20 0600) Weight:  [88.6 kg] 88.6 kg (09/20 0500) Last BM Date: 09/21/2018  Weight change: Filed Weights   09/25/2018 0545 09/16/18 0500 09/17/18 0500  Weight: 90.4 kg 90.9 kg 88.6 kg   Intake/Output:   Intake/Output Summary (Last 24 hours) at 09/17/2018 0745 Last data filed at 09/17/2018 0600 Gross per 24 hour  Intake 1190.22 ml  Output 1745 ml  Net -554.78 ml    Physical Exam    General:  Well appearing. No resp difficulty HEENT: normal Neck: supple. JVP 7. Carotids 2+ bilat; no bruits. No lymphadenopathy or thryomegaly appreciated. Cor: PMI nondisplaced. Regular rate & rhythm. No rubs, gallops or murmurs. Lungs: clear decreased BS left base Abdomen: soft, nontender, nondistended. No hepatosplenomegaly. No bruits or masses. Good bowel sounds. Extremities: no cyanosis, clubbing, rash, 2+ edema Neuro: alert & orientedx3, cranial nerves grossly intact. moves all 4 extremities w/o difficulty. Affect pleasant  Telemetry   NSR  80s.Personally reviewed   EKG    No new tracings.    Labs    CBC Recent Labs    09/12/2018 0609 09/16/18 0233  WBC 9.9 8.6  HGB 11.4* 10.7*  HCT 35.0* 33.1*  MCV 86.2 86.2  PLT 204 176   Basic Metabolic Panel Recent Labs    40/98/1109/19/19 0233 09/17/18 0157  NA 134* 132*  K 3.3* 4.1  CL 98 100  CO2 26 22  GLUCOSE 136* 182*  BUN 47* 38*  CREATININE 2.87* 2.40*  CALCIUM 8.2* 7.9*  MG 2.2  --    Liver Function Tests Recent Labs    09/16/18 1119  PROT 6.1*   No results for input(s): LIPASE, AMYLASE in the last 72 hours. Cardiac Enzymes No results for input(s): CKTOTAL, CKMB, CKMBINDEX, TROPONINI in the last 72 hours. BNP: BNP (last 3 results) Recent Labs    08/15/18 1649 08/20/18 1258 2018/06/07 2318  BNP 930.7* 1,184.0* 1,989.1*   ProBNP (last 3 results) No results for input(s): PROBNP in the last 8760 hours.  D-Dimer No results for input(s): DDIMER in the last 72 hours. Hemoglobin A1C No results for input(s): HGBA1C in the last 72 hours. Fasting Lipid Panel Recent Labs    09/16/18 1119  CHOL 101   Thyroid Function Tests No results for input(s): TSH, T4TOTAL, T3FREE, THYROIDAB in the last 72 hours.  Invalid input(s): FREET3  Other results:  Imaging   Dg Chest Port 1 View  Result Date: 09/16/2018 CLINICAL DATA:  Status post thoracentesis. EXAM: PORTABLE CHEST  1 VIEW COMPARISON:  Radiograph of same day. FINDINGS: Stable cardiomegaly. Right pleural effusion is resolved status post thoracentesis. No pneumothorax is noted. Stable left pleural effusion is noted with associated atelectasis or edema. Bony thorax is unremarkable. IMPRESSION: Right pleural effusion is resolved status post thoracentesis. No pneumothorax is noted. Electronically Signed   By: Lupita Raider, M.D.   On: 09/16/2018 11:46    Medications:    Scheduled Medications: . aspirin EC  81 mg Oral Daily  . atorvastatin  40 mg Oral q1800  . glimepiride  4 mg Oral BID WC  . insulin  aspart  0-5 Units Subcutaneous QHS  . insulin aspart  0-9 Units Subcutaneous TID WC  . multivitamin with minerals  1 tablet Oral Daily  . potassium chloride  40 mEq Oral BID  . sertraline  50 mg Oral Daily  . sodium chloride flush  3 mL Intravenous Q12H    Infusions: . sodium chloride    . ceFEPime (MAXIPIME) IV 200 mL/hr at 09/16/18 1700  . furosemide Stopped (09/16/18 1836)  . heparin 1,400 Units/hr (09/17/18 0600)  . milrinone 0.25 mcg/kg/min (09/17/18 0600)    PRN Medications: sodium chloride, acetaminophen **OR** acetaminophen, bisacodyl, docusate sodium, LORazepam, ondansetron (ZOFRAN) IV, polyethylene glycol, sodium chloride flush, traMADol, traZODone  Patient Profile   Jose Myers is a 62 y.o. 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  Admitted with abdominal pain, N/V, and AKI.   Assessment/Plan   1.Acute on chronic systolic CHF: -Ischemic cardiomyopathy,Echo 8/23/19EF 25-30% by echo. - Echo 09/12/18 LVEF 20-25%, Severe MR, Mild Mod TI, Trivial PI, PA peak pressure 60 mm Hg - RHC on 9/18 with low output/shock and elevated filling pressures - Milrinone started 9/18. Diuresis much improved. Continue lasix 120 IV bid at least one more day. - No bblocker or ARB due to CKD and shock. Will start hydral/nitrates soon - Ideally would need advanced therapies but likely limited by CKD  2. Bilateral Pleural effusions R>L - With multiple recent thoracentesis: 09/04/18 with 800 mL off R side. 9/19 R thora 1600cc off (transudative) - Much improved. Continue diuresis  - If R effusion recurs will need Pleurex  3. Acute hypoxemic respiratory failure - due to #1 and #2. Much improved - continue diuresis.   4. 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 Will see how good we can get it with inotropic support.   5.  Severe multivessel CAD: -Not CABG candidate.Interventional cardiology reviewed films at length 08/24/18 and felt not to be candidate for PCI.  - No s/s ischemia - Continue ASA/statin. - Also on coumadin for LV thrombus and ?CVA.Coumadin on hold   6. LV Thrombus: - INR 1.8 yesterday. Has been reversed for thora.  - Start heparin when INR < 1.8  7. 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 NO change.   8. DM2: - Per primary. No change.  - Off metformin due to GI side effect - consider gastric emptying study to evaluate for gastroparesis (admitted with ab pain)9. NSVT  9. NSVT - off amio due to intolerance (GI) - Keep K > 4.0 Mg > 2.0   10. Hypokalemia - K 4.1    Length of Stay: 3  Arvilla Meres, MD  09/17/2018, 7:45 AM  Advanced Heart Failure Team Pager [phone] (M-F; 7a -  4p)  Please contact [facility] for night-coverage after hours (4p -7a ) and weekends on [email or web address]

## 2018-09-17 NOTE — Progress Notes (Signed)
ANTICOAGULATION CONSULT NOTE  Pharmacy Consult for Heparin Indication: LV thrombus  No Known Allergies  Patient Measurements: Height: 5\' 9"  (175.3 cm) Weight: 200 lb 6.4 oz (90.9 kg) IBW/kg (Calculated) : 70.7 HEPARIN DW (KG): 88.6  Vital Signs: Temp: 98.1 F (36.7 C) (09/20 0024) Temp Source: Oral (09/20 0024) BP: 105/81 (09/20 0200) Pulse Rate: 92 (09/20 0200)  Labs: Recent Labs    09/14/18 0545 09/07/2018 0609  09/16/18 0233 09/16/18 1615 09/17/18 0152 09/17/18 0157  HGB 12.3* 11.4*  --  10.7*  --   --   --   HCT 38.4* 35.0*  --  33.1*  --   --   --   PLT 222 204  --  176  --   --   --   LABPROT 52.7* 42.4*   < > 22.4* 18.3*  --  20.6*  INR 5.94* 4.50*   < > 1.98 1.53  --  1.79  HEPARINUNFRC  --   --   --   --   --  >2.20*  --   CREATININE 2.83* 3.16*  --  2.87*  --   --  2.40*   < > = values in this interval not displayed.    Estimated Creatinine Clearance: 34.7 mL/min (A) (by C-G formula based on SCr of 2.4 mg/dL (H)).   Assessment: [age] yoM with LV thrombus, on warfarin PTA. Home dose 2 mg daily. INR elevated during admission, peaked at 5.94. Vit K 2 mg po and 10 mg IV were given yesterday to reverse INR for thoracentesis. Pharmacy consulted to start heparin when INR < 1.8.  Heparin level reported as >2.2 units/ml  Goal of Therapy:  Heparin level 0.3-0.7 units/ml Monitor platelets by anticoagulation protocol: Yes   Plan:  Hold heparin for 1 hr Restart at 1400 units/hr Check 6 hr heparin level Daily PT/INR, heparin level and CBC Monitor closely for s/sx of bleeding  Talbert CageLora Osborne Serio, PharmD 09/17/2018, 3:16 AM

## 2018-09-17 NOTE — Progress Notes (Signed)
NAME:  Hayden Rasmussenommy L Gehret, MRN:  [id number], DOB:  12-06-1956, LOS: 3 ADMISSION DATE:  09/03/2018, CONSULTATION DATE:  11/04/2018 REFERRING MD:  Dr. Gala RomneyBensimhon, CHIEF COMPLAINT:  SOB  Brief History   62 year old male with PMH of HTN, CAD (NSTEMI 08/15/2018), VT arrest 08/20/2018 with post arrest TTE showing LV thrombus on coumadin, DMT2, CKD stage III- IV admitted with AKI secondary to nausea, vomiting, and diarrhea while taking metformin.  Continued unclear nausea and vomiting.  Worsening hypervolemia, dyspnea, cough, and orthopnea.  Empiric treatment for HCAP starting on 9/16. Worsening symptomatic dyspnea, NP cough, orthopnea, and hypervolemia with increasing bilateral pleural effusions.  INR elevated, being reversed.  RHC 9/18 confirmed cardiogenic shock.  Tx to ICU to be started on milrinone and aggressive diuresis.  PCCM consulted for therapeutic thoracentesis and possible pleurex catheter placement.  Significant Hospital Events   9/14 Admit 9/18 RHC; tx to ICU 9/19 patient is sitting at the side of the bed he is on milrinone feeling better still on nasal cannula denies any chest pain or shortness of breath.  Patient was given vitamin K yesterday since INR is improving.  He did have a run of NSVT overnight. 9/20 s/p thoracentesis. Patient states he is breathing better. Remains on milrinone.  Consults: date of consult/date signed off & final recs:  HF team 9/5 Psychiatry 9/15 PCCM 9/18 Procedures (surgical and bedside):  11/04/2018 RHC 09/16/18 thoracentesis. Significant Diagnostic Tests:  Echo 09/12/18 - Left ventricle: The cavity size was severely dilated. Systolic function was severely reduced. The estimated ejection fraction was in the range of 20% to 25%. - Regional wall motion abnormality: Akinesis of the mid-apical anterior, mid anteroseptal, mid inferoseptal, apical septal, and apical myocardium; moderate hypokinesis of the mid anterolateral and apical lateral myocardium. This  is consistent with infarction in the LAD territory. - Mitral valve: There was severe regurgitation. - Atrial septum: No defect or patent foramen ovale was identified. - Tricuspid valve: There was mild-moderate regurgitation. - Pulmonic valve: There was trivial regurgitation. - Pulmonary arteries: Systolic pressure was severely increased. PA peak pressure: 60 mm Hg (S). - Pericardium, extracardiac: There was a left pleural effusion. Impressions: - Severe LV dysfunction with wall motion abnormalities consistent with infarct in LAD territory. Severe MR. Elevated right sided filling pressures. Similar to prior study.  CT abd/pelvis 09/19/2018 1. Fluid within mildly distended stomach, may be related to p.o. intake. No evidence of gastric inflammation or wall thickening to suggest gastritis. 2. Mild hepatic steatosis. High-density gallbladder contents may be sludge or stones. No gallbladder inflammation. 3. Small to moderate right and small left pleural effusion in the included lung bases, similar to slightly increased from CT 1 month ago. 4. Aortic Atherosclerosis (ICD10-I70.0).  AXR 09/13/18  No evidence of bowel obstruction or ileus  RHC 11/04/2018 >> RA = 4 RV = 48/6 PA = 53/22 (37) PCW = 31 (v waves to 40)  Fick cardiac output/index =3.2/1.5 PVR = 2.0 WU Ao sat = 95% PA sat = 38%, 40%  Micro Data: none  Antimicrobials:  9/16 cefepime >> 9/16 vancomycin >>09/16/18  Subjective:  Patient less dyspneic s/p thoracentesis. Denies chest pain or palpitations.  Objective   Blood pressure 111/73, pulse 88, temperature 98.1 F (36.7 C), temperature source Oral, resp. rate (!) 22, height 5\' 9"  (1.753 m), weight 88.6 kg, SpO2 100 %.        Intake/Output Summary (Last 24 hours) at 09/17/2018 0921 Last data filed at 09/17/2018 0800 Gross per 24 hour  Intake 1445.47 ml  Output 1745 ml  Net -299.53 ml   Filed Weights   09/08/2018 0545 09/16/18 0500 09/17/18 0500  Weight:  90.4 kg 90.9 kg 88.6 kg    Examination: General: Awake, alert and oriented. Sitting upright in a chair. HEENT: +JVD. PERL. No icterus. Lungs: Decreased breath sounds at the bases, L>R. No crackles or rub. Cardiovascular: S1S2, regular rhythm. No gallop or murmur. Abdomen: soft, non tender. No organomegaly. Extremities: warm to touch. +1 pedal edema.  Neuro: No gross motor deficits. Awake and alert and oriented.   Resolved Hospital Problem list    Assessment & Plan:  Acute on chronic hypoxic respiratory failure secondary to chronic Bilateral Pleural Effusions most likely related to chronic systolic heart failure - Right greater than left by CXR - 08/27/18, IR Right thoracentesis, removed 1.4 L  -  09/03/18- Right thoracentesis by IR, 1.7 L of light amber fluid -  09/04/18-  Left thoracentesis by IR, 800 ml of clear yellow fluid removed off;  transudative by Lights criteria -09/16/18- right thoracentesis by CCM. 1.6 liters of fluid aspirated. CXR showed resolution of effusion and no pneumothorax. Fluid c/w a transudate by Light's criteria. P:  - Patient oxygenation and respiratory status has improved with starting milrinone and diuresis  -Continue scheduled IV Lasix  -Keep patient in negative balance  - maximize medical management per HF team as below with milrinone/ diuresis  -We will attempt a right thoracentesis today and continue maximizing medical management of his heart failure but if continues to fail and re-accumulates we will consider a Pleurx as a last resort which can be done by thoracic surgery or interventional radiology  -Supplemental O2 prn. Maintain spo2 > 92%.    Cardiogenic Shock secondary acute on chronic systolic HF/ ICM - TTE 9/15 LVEF 20-25%, severe MR, mild/mod TI, PAP 60 mmHg - not a candidate for CABG or PCI per last admission P:  per HF team  -Continue milrinone and titrate -Continue IV Lasix twice daily   R/o PNA, left lower opacification  -Suspected  aspiration given numerous vomiting -Continue on cefepime 9/16. Will discontinue after 7 days. P:  -Procalcitonin is very low less likely to be an pneumonia  -We will continue cefepime for now though due to his severe illness and have low threshold for de-escalation soon -I will discontinue vancomycin since MRSA negative    AKI, hx of CKD, stage III- IV P:  Diuresis per HF Trending daily wts/ strict I/Os/ BMETs  LV thrombus - on heparin. Coumadin held. P:  Hold coumadin. Pharmacy managing dose of heparin.  Disposition / Summary of Today's Plan 09/17/18   See above.    Diet: Cardiac, carbohydrate modified. Pain/Anxiety/Delirium protocol (if indicated): n/a VAP protocol (if indicated) n/a DVT prophylaxis: IV heparin GI prophylaxis: n/a Hyperglycemia protocol: s/s insulin  Mobility:ambulating in the room Code Status: full Family Communication: none at the bedside  Labs   CBC: Recent Labs  Lab 09/12/18 0253 09/13/18 1116 09/14/18 0545 09/22/2018 0609 09/16/18 0233  WBC 13.9* 14.7* 11.8* 9.9 8.6  HGB 13.1 12.7* 12.3* 11.4* 10.7*  HCT 42.1 39.5 38.4* 35.0* 33.1*  MCV 89.6 86.6 86.5 86.2 86.2  PLT 239 223 222 204 176   Basic Metabolic Panel: Recent Labs  Lab 09/13/18 1116 09/14/18 0545 08/30/2018 0609 09/16/18 0233 09/17/18 0157  NA 133* 133* 133* 134* 132*  K 4.8 4.4 4.2 3.3* 4.1  CL 94* 95* 95* 98 100  CO2 25 25 25 26 22   GLUCOSE  212* 176* 146* 136* 182*  BUN 39* 43* 49* 47* 38*  CREATININE 2.98* 2.83* 3.16* 2.87* 2.40*  CALCIUM 8.8* 8.9 8.4* 8.2* 7.9*  MG  --   --   --  2.2  --    GFR: Estimated Creatinine Clearance: 34.3 mL/min (A) (by C-G formula based on SCr of 2.4 mg/dL (H)). Recent Labs  Lab 09/12/18 0057 09/12/18 0214  09/13/18 1116 09/14/18 0545 19-Sep-2018 0609 09-19-2018 1724 09/16/18 0233  PROCALCITON  --   --   --  <0.10  --   --  <0.10  --   WBC  --   --    < > 14.7* 11.8* 9.9  --  8.6  LATICACIDVEN 2.36* 1.69  --   --   --   --   --    --    < > = values in this interval not displayed.   Liver Function Tests: Recent Labs  Lab 09/12/18 0253 09/16/18 1119  AST 35  --   ALT 23  --   ALKPHOS 65  --   BILITOT 1.3*  --   PROT 6.2* 6.1*  ALBUMIN 3.3*  --    No results for input(s): LIPASE, AMYLASE in the last 168 hours. No results for input(s): AMMONIA in the last 168 hours. ABG    Component Value Date/Time   PHART 7.379 08/22/2018 0822   PCO2ART 33.1 08/22/2018 0822   PO2ART 65.0 (L) 08/22/2018 0822   HCO3 21.4 19-Sep-2018 1414   HCO3 26.2 2018/09/19 1414   TCO2 22 2018/09/19 1414   TCO2 27 09-19-18 1414   ACIDBASEDEF 3.0 (H) Sep 19, 2018 1414   O2SAT 40.0 09/19/18 1414   O2SAT 38.0 September 19, 2018 1414    Coagulation Profile: Recent Labs  Lab Sep 19, 2018 1427 2018-09-19 2001 09/16/18 0233 09/16/18 1615 09/17/18 0157  INR 4.62* 2.89 1.98 1.53 1.79   Cardiac Enzymes: Recent Labs  Lab 09/12/2018 2318  TROPONINI 0.43*   HbA1C: Hgb A1c MFr Bld  Date/Time Value Ref Range Status  08/22/2018 03:37 AM 8.2 (H) 4.8 - 5.6 % Final    Comment:    (NOTE) Pre diabetes:          5.7%-6.4% Diabetes:              >6.4% Glycemic control for   <7.0% adults with diabetes   08/21/2018 03:40 AM 8.2 (H) 4.8 - 5.6 % Final    Comment:    (NOTE) Pre diabetes:          5.7%-6.4% Diabetes:              >6.4% Glycemic control for   <7.0% adults with diabetes    CBG: Recent Labs  Lab 09/16/18 0654 09/16/18 1221 09/16/18 1634 09/16/18 2141 09/17/18 0654  GLUCAP 109* 114* 266* 168* 126*    Admitting History of Present Illness.   See above. Review of Systems:   General: awake, alert and oriented Cardiac: denies chest pain or palpitations Pulmonary: + for dyspnea, improved after thoracentesis GI: negative for pain or nausea Musculoskeletal: + for leg swelling Neuro: - for lightheadedness. Past medical history  He,  has a past medical history of Coronary artery disease, Diabetes mellitus without complication  (HCC), and Hypertension.   Surgical History    Past Surgical History:  Procedure Laterality Date  . CARDIAC CATHETERIZATION    . CORONARY ANGIOGRAPHY N/A 08/19/2018   Procedure: CORONARY ANGIOGRAPHY (CATH LAB);  Surgeon: Lyn Records, MD;  Location[facility] INVASIVE CV LAB;  Service: Cardiovascular;  Laterality: N/A;  . EYE SURGERY    . ICD IMPLANT N/A 08/31/2018   Procedure: ICD IMPLANT;  Surgeon: Marinus Maw, MD;  Location[facility] INVASIVE CV LAB;  Service: Cardiovascular;  Laterality: N/A;  . IR THORACENTESIS ASP PLEURAL SPACE W/IMG GUIDE  08/27/2018  . IR THORACENTESIS ASP PLEURAL SPACE W/IMG GUIDE  09/03/2018  . PERCUTANEOUS CORONARY STENT INTERVENTION (PCI-S)  04/11/2003   LAD  . RIGHT HEART CATH N/A 08/19/2018   Procedure: RIGHT HEART CATH;  Surgeon: Lyn Records, MD;  Location[facility] INVASIVE CV LAB;  Service: Cardiovascular;  Laterality: N/A;  . RIGHT HEART CATH N/A 09/01/2018   Procedure: RIGHT HEART CATH;  Surgeon: Dolores Patty, MD;  Location: [facility] INVASIVE CV LAB;  Service: Cardiovascular;  Laterality: N/A;     Social History   Social History   Socioeconomic History  . Marital status: Married    Spouse name: Not on file  . Number of children: Not on file  . Years of education: Not on file  . Highest education level: Not on file  Occupational History  . Not on file  Social Needs  . Financial resource strain: Not on file  . Food insecurity:    Worry: Not on file    Inability: Not on file  . Transportation needs:    Medical: Not on file    Non-medical: Not on file  Tobacco Use  . Smoking status: Never Smoker  . Smokeless tobacco: Never Used  Substance and Sexual Activity  . Alcohol use: Yes    Comment: Occasionally.  . Drug use: Not on file  . Sexual activity: Not on file  Lifestyle  . Physical activity:    Days per week: Not on file    Minutes per session: Not on file  . Stress: Not on file  Relationships  . Social connections:    Talks on phone: Not on  file    Gets together: Not on file    Attends religious service: Not on file    Active member of club or organization: Not on file    Attends meetings of clubs or organizations: Not on file    Relationship status: Not on file  . Intimate partner violence:    Fear of current or ex partner: Not on file    Emotionally abused: Not on file    Physically abused: Not on file    Forced sexual activity: Not on file  Other Topics Concern  . Not on file  Social History Narrative  . Not on file  ,  reports that he has never smoked. He has never used smokeless tobacco. He reports that he drinks alcohol.   Family history   His family history includes Diabetes Mellitus II in his mother; Throat cancer in his brother.   Allergies No Known Allergies  Home meds  Prior to Admission medications   Medication Sig Start Date End Date Taking? Authorizing Provider  Apple Cid Vn-Grn Tea-Bit Or-Cr (APPLE CIDER VINEGAR PLUS) TABS Take 1 tablet by mouth daily.   Yes [provider]  aspirin 81 MG EC tablet Take 1 tablet (81 mg total) by mouth daily. 09/07/18  Yes Alford Highland, NP  atorvastatin (LIPITOR) 40 MG tablet Take 1 tablet (40 mg total) by mouth daily at 6 PM. 09/06/18  Yes Alford Highland, NP  carvedilol (COREG) 6.25 MG tablet Take 0.5 tablets (3.125 mg total) by mouth 2 (two) times daily with a meal. 09/06/18  Yes Alford Highland, NP  docusate sodium (COLACE) 100 MG capsule Take 1 capsule (100 mg total) by mouth 2 (two) times daily as needed for mild constipation. 09/06/18  Yes Alford Highland, NP  glimepiride (AMARYL) 4 MG tablet Take 4 mg by mouth 2 (two) times daily.   Yes [provider]  LORazepam (ATIVAN) 1 MG tablet Take 0.5-1 mg by mouth daily as needed for anxiety.   Yes [provider]  Multiple Vitamin (MULTIVITAMIN WITH MINERALS) TABS tablet Take 1 tablet by mouth daily. 09/07/18  Yes Alford Highland, NP  Omega 3 340 MG CPDR Take 1 capsule by mouth at bedtime.   Yes  [provider]  ondansetron (ZOFRAN-ODT) 4 MG disintegrating tablet Take 1 tablet (4 mg total) by mouth every 8 (eight) hours as needed for nausea or vomiting. 09/09/18  Yes Bensimhon, Bevelyn Buckles, MD  polyethylene glycol (MIRALAX / GLYCOLAX) packet Take 17 g by mouth daily as needed for moderate constipation. 09/06/18  Yes Alford Highland, NP  potassium chloride SA (K-DUR,KLOR-CON) 20 MEQ tablet Take 3 tablets (60 mEq total) by mouth daily. 09/09/18  Yes Bensimhon, Bevelyn Buckles, MD  sertraline (ZOLOFT) 50 MG tablet Take 25 mg by mouth daily. 07/13/18  Yes [provider]  torsemide (DEMADEX) 20 MG tablet Take 3 tablets (60 mg total) by mouth daily. 09/07/18  Yes Alford Highland, NP  traZODone (DESYREL) 50 MG tablet Take 50 mg by mouth at bedtime as needed for sleep.  08/13/18  Yes [provider]  warfarin (COUMADIN) 2 MG tablet Take 1 tablet (2 mg total) by mouth daily. 09/06/18 09/06/19 Yes Alford Highland, NP  feeding supplement, GLUCERNA SHAKE, (GLUCERNA SHAKE) LIQD Take 237 mLs by mouth 2 (two) times daily between meals. Patient not taking: Reported on 09/12/2018 09/06/18   Alford Highland, NP     CRITICAL CARE Performed by: Elayne Snare   Total critical care time: 33 minutes  Critical care time was exclusive of separately billable procedures and treating other patients.  Critical care was necessary to treat or prevent imminent or life-threatening deterioration. Patient requiring inotropic support for CHF.  Critical care was time spent personally by me on the following activities: development of treatment plan with patient and/or surrogate as well as nursing, discussions with consultants, evaluation of patient's response to treatment, examination of patient, obtaining history from patient or surrogate, ordering and performing treatments and interventions, ordering and review of laboratory studies, ordering and review of radiographic studies, pulse oximetry and re-evaluation of  patient's condition.

## 2018-09-17 NOTE — Progress Notes (Signed)
ANTICOAGULATION CONSULT NOTE  Pharmacy Consult for Heparin Indication: LV thrombus  No Known Allergies  Patient Measurements: Height: 5\' 9"  (175.3 cm) Weight: 195 lb 5.2 oz (88.6 kg) IBW/kg (Calculated) : 70.7 HEPARIN DW (KG): 88.6  Vital Signs: Temp: 98.1 F (36.7 C) (09/20 0024) Temp Source: Oral (09/20 0024) BP: 111/73 (09/20 0800) Pulse Rate: 88 (09/20 0800)  Labs: Recent Labs    09/16/2018 0609  09/16/18 0233 09/16/18 1615 09/17/18 0152 09/17/18 0157 09/17/18 1020  HGB 11.4*  --  10.7*  --   --   --   --   HCT 35.0*  --  33.1*  --   --   --   --   PLT 204  --  176  --   --   --   --   LABPROT 42.4*   < > 22.4* 18.3*  --  20.6*  --   INR 4.50*   < > 1.98 1.53  --  1.79  --   HEPARINUNFRC  --   --   --   --  >2.20*  --  0.67  CREATININE 3.16*  --  2.87*  --   --  2.40*  --    < > = values in this interval not displayed.    Estimated Creatinine Clearance: 34.3 mL/min (A) (by C-G formula based on SCr of 2.4 mg/dL (H)).   Assessment: [age] yoM with LV thrombus, on warfarin PTA. Home dose 2 mg daily. INR elevated during admission, peaked at 5.94. Vit K 2 mg po and 10 mg IV were given yesterday to reverse INR for thoracentesis. Pharmacy consulted to start heparin when INR < 1.8.  INR increased despite being off warfarin. Heparin level therapeutic at 0.67 on heparin 1400 units/hr. Hemoglobin decreased sightly, possibly dilutional. No signs/symptoms of bleeding or issues reported with infusion by nursing.  Goal of Therapy:  Heparin level 0.3-0.7 units/ml Monitor platelets by anticoagulation protocol: Yes   Plan:  Continue heparin 1400 units/hr Check 6 hr heparin level to ensure level doesn't uptrend Daily PT/INR, heparin level and CBC Monitor closely for s/sx of bleeding  Marcelino FreestoneEmily Enio Hornback, PharmD PGY2 Cardiology Pharmacy Resident Phone [phone] 09/17/2018 11:34 AM

## 2018-09-18 ENCOUNTER — Inpatient Hospital Stay (HOSPITAL_COMMUNITY): Payer: BLUE CROSS/BLUE SHIELD

## 2018-09-18 DIAGNOSIS — R570 Cardiogenic shock: Secondary | ICD-10-CM

## 2018-09-18 LAB — GLUCOSE, CAPILLARY
GLUCOSE-CAPILLARY: 129 mg/dL — AB (ref 70–99)
GLUCOSE-CAPILLARY: 237 mg/dL — AB (ref 70–99)
Glucose-Capillary: 202 mg/dL — ABNORMAL HIGH (ref 70–99)

## 2018-09-18 LAB — COMPREHENSIVE METABOLIC PANEL
ALBUMIN: 2.6 g/dL — AB (ref 3.5–5.0)
ALT: 19 U/L (ref 0–44)
ANION GAP: 10 (ref 5–15)
AST: 20 U/L (ref 15–41)
Alkaline Phosphatase: 57 U/L (ref 38–126)
BUN: 30 mg/dL — AB (ref 8–23)
CALCIUM: 8.2 mg/dL — AB (ref 8.9–10.3)
CO2: 22 mmol/L (ref 22–32)
Chloride: 103 mmol/L (ref 98–111)
Creatinine, Ser: 2.25 mg/dL — ABNORMAL HIGH (ref 0.61–1.24)
GFR calc non Af Amer: 29 mL/min — ABNORMAL LOW (ref >60)
GFR, EST AFRICAN AMERICAN: 34 mL/min — AB (ref >60)
GLUCOSE: 154 mg/dL — AB (ref 70–99)
POTASSIUM: 4.5 mmol/L (ref 3.5–5.1)
SODIUM: 135 mmol/L (ref 135–145)
Total Bilirubin: 0.8 mg/dL (ref 0.3–1.2)
Total Protein: 5.4 g/dL — ABNORMAL LOW (ref 6.5–8.1)

## 2018-09-18 LAB — CBC
HCT: 32.8 % — ABNORMAL LOW (ref 39.0–52.0)
Hemoglobin: 10.7 g/dL — ABNORMAL LOW (ref 13.0–17.0)
MCH: 27.7 pg (ref 26.0–34.0)
MCHC: 32.6 g/dL (ref 30.0–36.0)
MCV: 85 fL (ref 78.0–100.0)
PLATELETS: 217 10*3/uL (ref 150–400)
RBC: 3.86 MIL/uL — AB (ref 4.22–5.81)
RDW: 15 % (ref 11.5–15.5)
WBC: 7.7 10*3/uL (ref 4.0–10.5)

## 2018-09-18 LAB — POCT I-STAT, CHEM 8
BUN: 29 mg/dL — ABNORMAL HIGH (ref 8–23)
CALCIUM ION: 1.84 mmol/L — AB (ref 1.15–1.40)
CHLORIDE: 103 mmol/L (ref 98–111)
Creatinine, Ser: 2.6 mg/dL — ABNORMAL HIGH (ref 0.61–1.24)
GLUCOSE: 471 mg/dL — AB (ref 70–99)
HCT: 32 % — ABNORMAL LOW (ref 39.0–52.0)
Hemoglobin: 10.9 g/dL — ABNORMAL LOW (ref 13.0–17.0)
POTASSIUM: 4 mmol/L (ref 3.5–5.1)
SODIUM: 136 mmol/L (ref 135–145)
TCO2: 21 mmol/L — ABNORMAL LOW (ref 22–32)

## 2018-09-18 LAB — POCT I-STAT 3, ART BLOOD GAS (G3+)
Acid-base deficit: 8 mmol/L — ABNORMAL HIGH (ref 0.0–2.0)
Bicarbonate: 20.7 mmol/L (ref 20.0–28.0)
O2 Saturation: 84 %
PCO2 ART: 54.5 mmHg — AB (ref 32.0–48.0)
PH ART: 7.186 — AB (ref 7.350–7.450)
Patient temperature: 98
TCO2: 22 mmol/L (ref 22–32)
pO2, Arterial: 60 mmHg — ABNORMAL LOW (ref 83.0–108.0)

## 2018-09-18 LAB — PH, BODY FLUID: PH, BODY FLUID: 8

## 2018-09-18 LAB — HEPARIN LEVEL (UNFRACTIONATED): HEPARIN UNFRACTIONATED: 0.63 [IU]/mL (ref 0.30–0.70)

## 2018-09-18 LAB — PROTIME-INR
INR: 1.42
PROTHROMBIN TIME: 17.2 s — AB (ref 11.4–15.2)

## 2018-09-18 MED ORDER — METOLAZONE 2.5 MG PO TABS
2.5000 mg | ORAL_TABLET | Freq: Once | ORAL | Status: AC
  Administered 2018-09-18: 2.5 mg via ORAL
  Filled 2018-09-18: qty 1

## 2018-09-18 MED ORDER — SODIUM BICARBONATE 8.4 % IV SOLN
INTRAVENOUS | Status: AC
  Filled 2018-09-18: qty 150

## 2018-09-18 MED ORDER — FENTANYL CITRATE (PF) 100 MCG/2ML IJ SOLN
INTRAMUSCULAR | Status: AC
  Filled 2018-09-18: qty 2

## 2018-09-18 MED ORDER — MIDAZOLAM HCL 2 MG/2ML IJ SOLN
INTRAMUSCULAR | Status: AC
  Filled 2018-09-18: qty 2

## 2018-09-18 MED ORDER — EPINEPHRINE PF 1 MG/10ML IJ SOSY
PREFILLED_SYRINGE | INTRAMUSCULAR | Status: AC
  Filled 2018-09-18: qty 30

## 2018-09-18 MED ORDER — EPINEPHRINE PF 1 MG/ML IJ SOLN
0.5000 ug/min | INTRAVENOUS | Status: DC
  Filled 2018-09-18: qty 4

## 2018-09-18 MED ORDER — AMIODARONE HCL IN DEXTROSE 360-4.14 MG/200ML-% IV SOLN
INTRAVENOUS | Status: AC
  Filled 2018-09-18: qty 200

## 2018-09-19 LAB — BODY FLUID CULTURE: Culture: NO GROWTH

## 2018-09-19 LAB — CHOLESTEROL, BODY FLUID: Cholesterol, Fluid: 15 mg/dL

## 2018-09-19 LAB — ADENOSIDE DEAMINASE, PLEURAL FL

## 2018-09-20 LAB — RHEUMATOID FACTORS, FLUID

## 2018-09-20 MED FILL — Medication: Qty: 1 | Status: AC

## 2018-09-21 ENCOUNTER — Telehealth (HOSPITAL_COMMUNITY): Payer: Self-pay | Admitting: *Deleted

## 2018-09-21 NOTE — Telephone Encounter (Signed)
Death certificate completed/signed by Dr. Gala RomneyBensimhon.  [facility]ed [facility] @ [phone].  Original left at front desk for pickup.  Copy will be scanned to patient's electronic medical record.

## 2018-09-24 ENCOUNTER — Encounter (HOSPITAL_COMMUNITY): Payer: Self-pay | Admitting: Internal Medicine

## 2018-09-28 NOTE — Progress Notes (Signed)
09/19/18 2040 Called and notified Dr. Karlyn [facility]Barnette-[facility] (Cards) about pt status.... Pt was not feeling well ... Sts "feel like I'm drowning" and "can't breathe".... Started his IV lasix and gave him his Ativan and trazodone as he requested... O2 = 93% ... Got a 12 lead   New orders were placed in for chest xray  09/19/18 2100  Pt requested bedside commode... He was sitting on the commode while I was charting in room.... Pt went into VT and non responsive.... Code blue was activated immediate and ACLS was commenced.

## 2018-09-28 NOTE — Death Summary Note (Signed)
Death note  Was notified at 539 PM that patient experienced a VT arrest and was receiving CPR.  Review of telemetry demonstrated monomorphic VT with appropriate therapies from his device.  After defibrillation there were periods of sinus rhythm but patient would then quickly go back into VT.  At times the VT appeared polymorphic but was mostly monomorphic.  Patient received CPR and appropriate ACLS medications including IV amiodarone, lidocaine, epinephrine, bicarb, and magnesium.  Despite prolonged efforts at resuscitation he could not maintain stable sinus rhythm and continued to have incessant VT.  We discussed placing a balloon pump, but patient could not maintain sinus rhythm long enough to be transported to the Cath Lab.  After over 30 minutes of CPR, we were unable to maintain a stable rhythm despite the above measures.  We therefore stopped CPR, and the patient was declared deceased.  Patient's wife was made aware.  Case was discussed with Dr. Gala RomneyBensimhon during the code.  Carlye GrippeAdam Ailani Governale, MD Cardiology

## 2018-09-28 NOTE — Evaluation (Signed)
Physical Therapy Evaluation Patient Details Name: Jose Myers MRN: [id number] DOB: [birth year] Today's Date: 09/20/2018   History of Present Illness  62 year old male with PMH of HTN, CAD (NSTEMI 08/15/2018), VT arrest 08/20/2018 with post arrest TTE showing LV thrombus on coumadin, DMT2, CKD stage III- IV admitted with AKI secondary to nausea, vomiting, and diarrhea while taking metformin.  Continued unclear nausea and vomiting.  Worsening hypervolemia, dyspnea, cough, and orthopnea.  Empiric treatment for HCAP starting on 9/16. Worsening symptomatic dyspnea, NP cough, orthopnea, and hypervolemia with increasing bilateral pleural effusions.  INR elevated, being reversed.  RHC 9/18 confirmed cardiogenic shock.  Tx to ICU to be started on milrinone and aggressive diuresis.  PCCM consulted for therapeutic thoracentesis and possible pleurex catheter placement.    Clinical Impression  Pt admitted with above diagnosis. Pt currently with functional limitations due to the deficits listed below (see PT Problem List). PTA, pt living at home recovering from prior hospital admission, ambulating without AD. Upon eval pt reports he is feeling better after thoracentesis, expresses high levels of distress over readmittance. "I am not a weak man but this is very hard", with tears. Pt ambulating unit with RW, SpO2 WNL on RA, HR 108 max, BP 122/80 after activity. Pt with weakness dn mild unsteadiness ambulating, will benefit from ongoing PT for reconditioning and strength once medically ready for d/c.  Pt will benefit from skilled PT to increase their independence and safety with mobility to allow discharge to the venue listed below.       Follow Up Recommendations Outpatient PT;Supervision for mobility/OOB    Equipment Recommendations  None recommended by PT    Recommendations for Other Services OT consult     Precautions / Restrictions Precautions Precautions: Fall Restrictions Weight Bearing  Restrictions: No      Mobility  Bed Mobility Overal bed mobility: Modified Independent                Transfers Overall transfer level: Needs assistance Equipment used: Rolling walker (2 wheeled) Transfers: Sit to/from Stand Sit to Stand: Supervision Stand pivot transfers: Min guard       General transfer comment: supervision for safety  Ambulation/Gait Ambulation/Gait assistance: Supervision Gait Distance (Feet): 400 Feet Assistive device: Rolling walker (2 wheeled) Gait Pattern/deviations: Step-through pattern;Decreased stride length Gait velocity: decreased   General Gait Details: pt slow, SpO2 WNL and HR with walking. no overt LOB but stany by assist  Stairs            Wheelchair Mobility    Modified Rankin (Stroke Patients Only)       Balance Overall balance assessment: Independent Sitting-balance support: Feet supported;No upper extremity supported Sitting balance-Leahy Scale: Good       Standing balance-Leahy Scale: Fair                               Pertinent Vitals/Pain Pain Assessment: No/denies pain Faces Pain Scale: Hurts a little bit Pain Location: coughing  Pain Descriptors / Indicators: Sore Pain Intervention(s): Limited activity within patient's tolerance;Premedicated before session;Monitored during session    Home Living Family/patient expects to be discharged to:: Private residence Living Arrangements: Spouse/significant other Available Help at Discharge: Family;Available 24 hours/day Type of Home: House Home Access: Stairs to enter   [employer] of Steps: 3 Home Layout: One level Home Equipment: Cane - single point      Prior Function Level of Independence: Independent  Comments: ADLs,  IADLs, driving     Hand Dominance   Dominant Hand: Right    Extremity/Trunk Assessment   Upper Extremity Assessment Upper Extremity Assessment: Overall WFL for tasks assessed     Lower Extremity Assessment Lower Extremity Assessment: Overall WFL for tasks assessed       Communication   Communication: No difficulties  Cognition Arousal/Alertness: Awake/alert Behavior During Therapy: WFL for tasks assessed/performed Overall Cognitive Status: Within Functional Limits for tasks assessed                               Problem Solving: Slow processing        General Comments      Exercises     Assessment/Plan    PT Assessment Patient needs continued PT services  PT Problem List Decreased strength;Decreased activity tolerance;Decreased balance;Decreased mobility;Pain       PT Treatment Interventions DME instruction;Gait training;Stair training;Functional mobility training;Therapeutic activities;Therapeutic exercise    PT Goals (Current goals can be found in the Care Plan section)  Acute Rehab PT Goals Patient Stated Goal: get some rest PT Goal Formulation: With patient Time For Goal Achievement: 10/02/18 Potential to Achieve Goals: Good    Frequency Min 3X/week   Barriers to discharge Inaccessible home environment      Co-evaluation               AM-PAC PT "6 Clicks" Daily Activity  Outcome Measure Difficulty turning over in bed (including adjusting bedclothes, sheets and blankets)?: A Little Difficulty moving from lying on back to sitting on the side of the bed? : A Little Difficulty sitting down on and standing up from a chair with arms (e.g., wheelchair, bedside commode, etc,.)?: A Little Help needed moving to and from a bed to chair (including a wheelchair)?: A Little Help needed walking in hospital room?: A Little Help needed climbing 3-5 steps with a railing? : A Little 6 Click Score: 18    End of Session Equipment Utilized During Treatment: Gait belt Activity Tolerance: Patient tolerated treatment well Patient left: with family/visitor present;in chair;with bed alarm set Nurse Communication: Mobility status PT  Visit Diagnosis: Unsteadiness on feet (R26.81)    Time: 1440-1510 PT Time Calculation (min) (ACUTE ONLY): 30 min   Charges:   PT Evaluation $PT Eval Moderate Complexity: 1 Mod PT Treatments $Gait Training: 8-22 mins        Etta GrandchildSean Tryton Bodi, PT, DPT Acute Rehabilitation Services Pager: [phone] Office: [phone]    Etta GrandchildSean Lavelle Berland 09/05/2018, 3:16 PM

## 2018-09-28 NOTE — Progress Notes (Signed)
Advanced Heart Failure Rounding Note  PCP-Cardiologist: [name], MD   Subjective:    Underwent RHC on 9/19 due to worsening SOB and AKI.  Showed low output with markedly elevated filling pressures. Started on milrinone and IV lasix.   Underwent right thoracentesis on 9/19 with 1600cc off.  Remains on milrinone and IV lasix. Weight up 1 pound. Feeling much better. Creatinine improved 2.9->2.4-> 2.2. Denies dyspnea. Orthopnea resolved. No PND or CP. On heparin INR 1.4. No bleeding.   Echo 09/12/18 - LVEF 20-25%, Severe MR, Mild Mod TI, Trivial PI, PA peak pressure 60 mm Hg   Objective:    Weight Range: 89.2 kg Body mass index is 29.04 kg/m.   Vital Signs:   Temp:  [97.6 F (36.4 C)-98.3 F (36.8 C)] 97.6 F (36.4 C) (09/21 0715) Pulse Rate:  [81-104] 95 (09/21 0800) Resp:  [14-31] 26 (09/21 0800) BP: (91-108)/(63-92) 107/83 (09/21 0800) SpO2:  [90 %-100 %] 94 % (09/21 0800) Weight:  [89.2 kg] 89.2 kg (09/21 0200) Last BM Date: 09/12/2018  Weight change: Filed Weights   09/16/18 0500 09/17/18 0500 10/17/18 0200  Weight: 90.9 kg 88.6 kg 89.2 kg   Intake/Output:   Intake/Output Summary (Last 24 hours) at 17-Oct-2018 0840 Last data filed at Oct 17, 2018 0800 Gross per 24 hour  Intake 1342.82 ml  Output 1000 ml  Net 342.82 ml    Physical Exam    General:  Well appearing. Sitting up in bed.  No resp difficulty HEENT: normal Neck: supple. JVP 7. Carotids 2+ bilat; no bruits. No lymphadenopathy or thryomegaly appreciated. Cor: PMI nondisplaced. Regular rate & rhythm. No rubs, gallops or murmurs. Lungs: dull with crackles at both bases Abdomen: soft, nontender, nondistended. No hepatosplenomegaly. No bruits or masses. Good bowel sounds. Extremities: no cyanosis, clubbing, rash, 2+ edema Neuro: alert & orientedx3, cranial nerves grossly intact. moves all 4 extremities w/o difficulty. Affect pleasant   Telemetry   NSR 90-100s.Personally reviewed   EKG      No new tracings.    Labs    CBC Recent Labs    09/16/18 0233 2018/10/17 0218  WBC 8.6 7.7  HGB 10.7* 10.7*  HCT 33.1* 32.8*  MCV 86.2 85.0  PLT 176 217   Basic Metabolic Panel Recent Labs    16/10/96 0233 09/17/18 0157 17-Oct-2018 0218  NA 134* 132* 135  K 3.3* 4.1 4.5  CL 98 100 103  CO2 26 22 22   GLUCOSE 136* 182* 154*  BUN 47* 38* 30*  CREATININE 2.87* 2.40* 2.25*  CALCIUM 8.2* 7.9* 8.2*  MG 2.2  --   --    Liver Function Tests Recent Labs    09/16/18 1119 10-17-18 0218  AST  --  20  ALT  --  19  ALKPHOS  --  57  BILITOT  --  0.8  PROT 6.1* 5.4*  ALBUMIN  --  2.6*   No results for input(s): LIPASE, AMYLASE in the last 72 hours. Cardiac Enzymes No results for input(s): CKTOTAL, CKMB, CKMBINDEX, TROPONINI in the last 72 hours. BNP: BNP (last 3 results) Recent Labs    08/15/18 1649 08/20/18 1258 09/09/2018 2318  BNP 930.7* 1,184.0* 1,989.1*   ProBNP (last 3 results) No results for input(s): PROBNP in the last 8760 hours.  D-Dimer No results for input(s): DDIMER in the last 72 hours. Hemoglobin A1C No results for input(s): HGBA1C in the last 72 hours. Fasting Lipid Panel Recent Labs    09/16/18 1119  CHOL 101  Thyroid Function Tests No results for input(s): TSH, T4TOTAL, T3FREE, THYROIDAB in the last 72 hours.  Invalid input(s): FREET3  Other results:  Imaging   No results found.  Medications:    Scheduled Medications: . aspirin EC  81 mg Oral Daily  . atorvastatin  40 mg Oral q1800  . glimepiride  4 mg Oral BID WC  . insulin aspart  0-5 Units Subcutaneous QHS  . insulin aspart  0-9 Units Subcutaneous TID WC  . multivitamin with minerals  1 tablet Oral Daily  . potassium chloride  40 mEq Oral BID  . sertraline  50 mg Oral Daily  . sodium chloride flush  3 mL Intravenous Q12H    Infusions: . sodium chloride 10 mL/hr at 09/20/2018 0800  . ceFEPime (MAXIPIME) IV Stopped (09/17/18 1727)  . furosemide 120 mg (08/30/2018 0828)  .  heparin 1,400 Units/hr (09/03/2018 0800)  . milrinone 0.25 mcg/kg/min (09/14/2018 0826)    PRN Medications: sodium chloride, acetaminophen **OR** acetaminophen, bisacodyl, docusate sodium, LORazepam, ondansetron (ZOFRAN) IV, polyethylene glycol, sodium chloride flush, traMADol, traZODone  Patient Profile   Hayden Rasmussenommy L Demattia is a 62 y.o. 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  Admitted with abdominal pain, N/V, and AKI.   Assessment/Plan   1.Acute on chronic systolic CHF: -Ischemic cardiomyopathy,Echo 8/23/19EF 25-30% by echo. - Echo 09/12/18 LVEF 20-25%, Severe MR, Mild Mod TI, Trivial PI, PA peak pressure 60 mm Hg - RHC on 2018-02-02 with low output/shock and elevated filling pressures - Milrinone started 9/18. Feels better. Creatinine improving. Diuresing well on IV lasix but weight up this am. Will continue IV lasix. Give one dose metolazone  - No bblocker or ARB due to CKD and shock. Will start hydral/nitrates soon as BP tolerates - We talked about possible need for advanced therapies but this is limited by CKD.  2. Bilateral Pleural effusions R>L - With multiple recent thoracentesis: 09/04/18 with 800 mL off R side. 9/19 R thora 1600cc off (transudative) - Much improved. Continue diuresis  - If R effusion recurs will need Pleurex. Will folow CXRs  3. Acute hypoxemic respiratory failure - due to #1 and #2. Much improved - continue diuresis.   4. Acute on  CKD III-IV:  - Baseline Cr appears to be 2.4 - 2.6 - Cr peaked at 3.2. Now down to 2.25. Continue inotropes and follow   5. Severe multivessel CAD: -Not CABG candidate.Interventional cardiology reviewed films at length 08/24/18 and felt not to be candidate for PCI.  - No s/s ischemia - Continue ASA/statin. - Also on coumadin for LV thrombus and ?CVA.Coumadin on hold   6. LV Thrombus: - INR 1.4. He got large dose of vit k  to reverse for thora.  - On heparin. No bleeding. Discussed dosing with PharmD personally.  7. 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 NO change.   8. DM2: - Per primary. No change.  - Off metformin due to GI side effect - consider gastric emptying study to evaluate for gastroparesis (admitted with ab pain)  9. NSVT - off amio due to intolerance (GI) - Keep K > 4.0 Mg > 2.0     Length of Stay: 4  Arvilla Meresaniel Bensimhon, MD  09/06/2018, 8:40 AM  Advanced Heart Failure Team Pager [phone] (M-F; 7a - 4p)  Please contact [facility] for night-coverage after hours (4p -7a ) and weekends on [email or web address]

## 2018-09-28 NOTE — Code Documentation (Signed)
  Patient Name: Hayden Rasmussenommy L Vivanco   MRN: [id number]   Date of Birth/ Sex: 1956-01-13 , male      Admission Date: 2018-12-09  Attending Provider: Reyes IvanAljishi, Wael Z, MD  Primary Diagnosis: Lactic acidosis [E87.2] Acute renal failure, unspecified acute renal failure type (HCC) [N17.9] Diarrhea, unspecified type [R19.7]   Indication: Pt was in his usual state of health until this PM, when he was noted to be ventricular tachycardia. Code blue was subsequently called. At the time of arrival on scene, ACLS protocol was underway.   Technical Description:  - CPR performance duration:  38 minutes  - Was defibrillation or cardioversion used? Yes x 11  - Was external pacer placed? No  - Was patient intubated pre/post CPR? Yes   Medications Administered: Y = Yes; Blank = No Amiodarone  Y  Atropine  Y  Calcium  Y  Epinephrine  Y  Lidocaine  Y  Magnesium  Y  Norepinephrine    Phenylephrine    Sodium bicarbonate  Y  Vasopressin    Other    Post CPR evaluation:  - Final Status - Was patient successfully resuscitated ? No   Miscellaneous Information:  - Time of death:  9:34 PM  - Primary team notified?  Yes  - Family Notified? Yes; voicemail left by nursing for family to return call to unit.      Levora DredgeHelberg, Calissa Swenor, MD   09/27/2018, 9:48 PM

## 2018-09-28 NOTE — Progress Notes (Signed)
ANTICOAGULATION CONSULT NOTE  Pharmacy Consult for Heparin Indication: LV thrombus  No Known Allergies  Patient Measurements: Height: 5\' 9"  (175.3 cm) Weight: 196 lb 10.4 oz (89.2 kg) IBW/kg (Calculated) : 70.7 HEPARIN DW (KG): 88.6  Vital Signs: Temp: 97.6 F (36.4 C) (09/21 0715) Temp Source: Oral (09/21 0715) BP: 107/83 (09/21 0800) Pulse Rate: 95 (09/21 0800)  Labs: Recent Labs    09/16/18 0233 09/16/18 1615  09/17/18 0157 09/17/18 1020 09/17/18 1611 09/19/2018 0218  HGB 10.7*  --   --   --   --   --  10.7*  HCT 33.1*  --   --   --   --   --  32.8*  PLT 176  --   --   --   --   --  217  LABPROT 22.4* 18.3*  --  20.6*  --   --  17.2*  INR 1.98 1.53  --  1.79  --   --  1.42  HEPARINUNFRC  --   --    < >  --  0.67 0.65 0.63  CREATININE 2.87*  --   --  2.40*  --   --  2.25*   < > = values in this interval not displayed.    Estimated Creatinine Clearance: 36.6 mL/min (A) (by C-G formula based on SCr of 2.25 mg/dL (H)).   Assessment: [age] yoM with LV thrombus, on warfarin PTA. Home dose 2 mg daily. INR elevated during admission, peaked at 5.94. Vit K 2 mg po and 10 mg IV were given yesterday to reverse INR for thoracentesis. Pharmacy consulted to start heparin when INR < 1.8.  INR increased despite being off warfarin. Heparin level therapeutic at 0.67 on heparin 1400 units/hr. Hemoglobin decreased sightly, possibly dilutional. No signs/symptoms of bleeding or issues reported with infusion by nursing.  Goal of Therapy:  Heparin level 0.3-0.7 units/ml Monitor platelets by anticoagulation protocol: Yes   Plan:  Continue heparin 1400 units/hr Daily PT/INR, heparin level and CBC Monitor closely for s/sx of bleeding  Leota SauersLisa Asa Baudoin Pharm.D. CPP, BCPS Clinical Pharmacist [phone] 09/16/2018 9:05 AM

## 2018-09-28 NOTE — Progress Notes (Signed)
Attempted calling wife on cell and home #. No answer. Left voicemail saying to call unit ASAP.

## 2018-09-28 NOTE — Significant Event (Signed)
20:30 -- came to check on pt as charge nurse per request of assigned RN Gerilyn NestleRamon d/t pt c/o dyspnea. Pt sitting forward in bed, visible accessory muscle use, tachypneic, anxious. Per pt "feels like I'm drowning" and "can't breathe". Lungs auscultated, fine crackles bil lower lobes, clear upper. SBP 100s, O2sat ~92% on 2L [place]. Tachycardic 120s. At first appeared to be Afib so at 20:42 obtained 12-lead and reviewed CV strips. Determined to be sinus tachy. Lytes from AM WNL. RN Gerilyn NestleRamon was hanging lasix IVPB at that time. Recommended he call Cards d/t concern for flash pulm edema given pt's HF w/low EF, and that pt may need BIPAP.   RN got orders for stat CXR at 20:54. Respiratory notified pt may need BiPAP.  21:00 -- Saw pt go into Vtach on monitor while at front desk. Ran to room, pt obtunded w/pallor on bedside commode. Called code, got pt into the bed w/help, initiated ACLS. TOD called by Dr. Merlene PullingHammonds (CCM) at 21:38 after having exhausting all efforts with no result. Cardiology fellow Dr. Electa SniffBarnett also present during code. Family notified.    Peyton Bottomsachel R Gerica Koble, RN 09/19/18

## 2018-09-28 NOTE — Progress Notes (Signed)
   09/07/2018 2300  Clinical Encounter Type  Visited With Family;Patient  Visit Type Follow-up  Referral From Nurse  Spiritual Encounters  Spiritual Needs Grief support;Emotional  Stress Factors  Family Stress Factors Loss;Exhausted   Responded to page. Nurse stated that family was wanting a Chaplain Present. Found family in hallway. Daughter stated she was very thankful for all the visits from all Chaplains. Chaplain offered spiritual care with ministry of presence.   Chaplain Orest DikesAbel Aluna Whiston

## 2018-09-28 NOTE — Progress Notes (Signed)
NAME:  Jose Myers, MRN:  [id number], DOB:  1956-07-31, LOS: 4 ADMISSION DATE:  09/02/2018, CONSULTATION DATE:  09/06/2018 REFERRING MD:  Dr. Gala Romney, CHIEF COMPLAINT:  SOB  Brief History   62 year old male with PMH of HTN, CAD (NSTEMI 08/15/2018), VT arrest 08/20/2018 with post arrest TTE showing LV thrombus on coumadin, DMT2, CKD stage III- IV admitted with AKI secondary to nausea, vomiting, and diarrhea while taking metformin.  Continued unclear nausea and vomiting.  Worsening hypervolemia, dyspnea, cough, and orthopnea.  Empiric treatment for HCAP starting on 9/16. Worsening symptomatic dyspnea, NP cough, orthopnea, and hypervolemia with increasing bilateral pleural effusions.  INR elevated, being reversed.  RHC 9/18 confirmed cardiogenic shock.  Tx to ICU to be started on milrinone and aggressive diuresis.  PCCM consulted for therapeutic thoracentesis and possible pleurex catheter placement.  Significant Hospital Events   9/14 Admit 9/18 RHC; tx to ICU 9/19 patient is sitting at the side of the bed he is on milrinone feeling better still on nasal cannula denies any chest pain or shortness of breath.  Patient was given vitamin K yesterday since INR is improving.  He did have a run of NSVT overnight. 9/20 s/p thoracentesis. Patient states he is breathing better. Remains on milrinone.  Consults: date of consult/date signed off & final recs:  HF team 9/5 Psychiatry 9/15 PCCM 9/18 Procedures (surgical and bedside):  18-Sep-2018 RHC 09/16/18 thoracentesis. Significant Diagnostic Tests:  Echo 09/12/18 - Left ventricle: The cavity size was severely dilated. Systolic function was severely reduced. The estimated ejection fraction was in the range of 20% to 25%. - Regional wall motion abnormality: Akinesis of the mid-apical anterior, mid anteroseptal, mid inferoseptal, apical septal, and apical myocardium; moderate hypokinesis of the mid anterolateral and apical lateral myocardium. This  is consistent with infarction in the LAD territory. - Mitral valve: There was severe regurgitation. - Atrial septum: No defect or patent foramen ovale was identified. - Tricuspid valve: There was mild-moderate regurgitation. - Pulmonic valve: There was trivial regurgitation. - Pulmonary arteries: Systolic pressure was severely increased. PA peak pressure: 60 mm Hg (S). - Pericardium, extracardiac: There was a left pleural effusion. Impressions: - Severe LV dysfunction with wall motion abnormalities consistent with infarct in LAD territory. Severe MR. Elevated right sided filling pressures. Similar to prior study.  CT abd/pelvis 09/07/2018 1. Fluid within mildly distended stomach, may be related to p.o. intake. No evidence of gastric inflammation or wall thickening to suggest gastritis. 2. Mild hepatic steatosis. High-density gallbladder contents may be sludge or stones. No gallbladder inflammation. 3. Small to moderate right and small left pleural effusion in the included lung bases, similar to slightly increased from CT 1 month ago. 4. Aortic Atherosclerosis (ICD10-I70.0).  AXR 09/13/18  No evidence of bowel obstruction or ileus  RHC 09/13/2018 >> RA = 4 RV = 48/6 PA = 53/22 (37) PCW = 31 (v waves to 40)  Fick cardiac output/index =3.2/1.5 PVR = 2.0 WU Ao sat = 95% PA sat = 38%, 40%  Micro Data: none  Antimicrobials:  9/16 cefepime >> 9/16 vancomycin >>09/16/18  Subjective:  Patient with no new complaints today. Denies difficulty breathing.  Objective   Blood pressure 101/80, pulse (!) 103, temperature (!) 97.4 F (36.3 C), temperature source Oral, resp. rate (!) 24, height 5\' 9"  (1.753 m), weight 89.2 kg, SpO2 96 %.        Intake/Output Summary (Last 24 hours) at 09/17/2018 1343 Last data filed at 09/19/2018 0900 Gross per 24  hour  Intake 1318.21 ml  Output 800 ml  Net 518.21 ml   Filed Weights   09/16/18 0500 09/17/18 0500 10/11/18 0200  Weight: 90.9  kg 88.6 kg 89.2 kg    Examination: General: Awake, alert and oriented. Sitting upright in a chair. HEENT: +JVD. PERL. No icterus. Lungs: Decreased breath sounds at the bases, L>R. No crackles or rub. No change today. Cardiovascular: S1S2, regular rhythm. No gallop or murmur. Abdomen: soft, non tender. No organomegaly. Extremities: warm to touch. +1 pedal edema.  Neuro: No gross motor deficits. Awake and alert and oriented.   Resolved Hospital Problem list    Assessment & Plan:  Acute on chronic hypoxic respiratory failure secondary to chronic Bilateral Pleural Effusions most likely related to chronic systolic heart failure - Right greater than left by CXR - 08/27/18, IR Right thoracentesis, removed 1.4 L  -  09/03/18- Right thoracentesis by IR, 1.7 L of light amber fluid -  09/04/18-  Left thoracentesis by IR, 800 ml of clear yellow fluid removed off;  transudative by Lights criteria -09/16/18- right thoracentesis by CCM. 1.6 liters of fluid aspirated. CXR showed resolution of effusion and no pneumothorax. Fluid c/w a transudate by Light's criteria. P:  - Patient oxygenation and respiratory status has improved with starting milrinone and diuresis  -Continue scheduled IV Lasix  -Keep patient in negative balance  - maximize medical management per HF team as below with milrinone/ diuresis  -We will attempt a right thoracentesis today and continue maximizing medical management of his heart failure but if continues to fail and re-accumulates we will consider a Pleurx as a last resort which can be done by thoracic surgery or interventional radiology  -Supplemental O2 prn. Maintain spo2 > 92%.    Cardiogenic Shock secondary acute on chronic systolic HF/ ICM - TTE 9/15 LVEF 20-25%, severe MR, mild/mod TI, PAP 60 mmHg - not a candidate for CABG or PCI per last admission P:  per HF team  -Continue milrinone and titrate -Continue IV Lasix twice daily - to receive a dose of metolazone  today.   R/o PNA, left lower opacification  -Suspected aspiration given numerous vomiting -Continue on cefepime 9/16. Will discontinue after 7 days. P:  -Procalcitonin is very low less likely to be an pneumonia  -We will continue cefepime for now though due to his severe illness.   AKI, hx of CKD, stage III- IV P:  Diuresis per HF Trending daily wts/ strict I/Os/ BMETs  LV thrombus - on heparin. Coumadin held. P:  Pharmacy managing dose of heparin.   Disposition / Summary of Today's Plan 10/11/18   See above.    Diet: Cardiac, carbohydrate modified. Pain/Anxiety/Delirium protocol (if indicated): n/a VAP protocol (if indicated) n/a DVT prophylaxis: IV heparin GI prophylaxis: n/a Hyperglycemia protocol: s/s insulin  Mobility:ambulating in the room Code Status: full Family Communication: none at the bedside  Labs   CBC: Recent Labs  Lab 09/13/18 1116 09/14/18 0545 08/30/2018 0609 09/16/18 0233 10-11-2018 0218  WBC 14.7* 11.8* 9.9 8.6 7.7  HGB 12.7* 12.3* 11.4* 10.7* 10.7*  HCT 39.5 38.4* 35.0* 33.1* 32.8*  MCV 86.6 86.5 86.2 86.2 85.0  PLT 223 222 204 176 217   Basic Metabolic Panel: Recent Labs  Lab 09/14/18 0545 09/13/2018 0609 09/16/18 0233 09/17/18 0157 10/11/18 0218  NA 133* 133* 134* 132* 135  K 4.4 4.2 3.3* 4.1 4.5  CL 95* 95* 98 100 103  CO2 25 25 26 22 22   GLUCOSE 176* 146* 136*  182* 154*  BUN 43* 49* 47* 38* 30*  CREATININE 2.83* 3.16* 2.87* 2.40* 2.25*  CALCIUM 8.9 8.4* 8.2* 7.9* 8.2*  MG  --   --  2.2  --   --    GFR: Estimated Creatinine Clearance: 36.6 mL/min (A) (by C-G formula based on SCr of 2.25 mg/dL (H)). Recent Labs  Lab 09/12/18 0057 09/12/18 0214  09/13/18 1116 09/14/18 0545 2018/10/03 0609 03-Oct-2018 1724 09/16/18 0233 08/29/2018 0218  PROCALCITON  --   --   --  <0.10  --   --  <0.10  --   --   WBC  --   --    < > 14.7* 11.8* 9.9  --  8.6 7.7  LATICACIDVEN 2.36* 1.69  --   --   --   --   --   --   --    < > = values in  this interval not displayed.   Liver Function Tests: Recent Labs  Lab 09/12/18 0253 09/16/18 1119 09/17/2018 0218  AST 35  --  20  ALT 23  --  19  ALKPHOS 65  --  57  BILITOT 1.3*  --  0.8  PROT 6.2* 6.1* 5.4*  ALBUMIN 3.3*  --  2.6*   No results for input(s): LIPASE, AMYLASE in the last 168 hours. No results for input(s): AMMONIA in the last 168 hours. ABG    Component Value Date/Time   PHART 7.379 08/22/2018 0822   PCO2ART 33.1 08/22/2018 0822   PO2ART 65.0 (L) 08/22/2018 0822   HCO3 21.4 October 03, 2018 1414   HCO3 26.2 2018/10/03 1414   TCO2 22 2018-10-03 1414   TCO2 27 October 03, 2018 1414   ACIDBASEDEF 3.0 (H) 2018/10/03 1414   O2SAT 40.0 2018/10/03 1414   O2SAT 38.0 10/03/2018 1414    Coagulation Profile: Recent Labs  Lab October 03, 2018 2001 09/16/18 0233 09/16/18 1615 09/17/18 0157 09/04/2018 0218  INR 2.89 1.98 1.53 1.79 1.42   Cardiac Enzymes: Recent Labs  Lab 09/22/2018 2318  TROPONINI 0.43*   HbA1C: Hgb A1c MFr Bld  Date/Time Value Ref Range Status  08/22/2018 03:37 AM 8.2 (H) 4.8 - 5.6 % Final    Comment:    (NOTE) Pre diabetes:          5.7%-6.4% Diabetes:              >6.4% Glycemic control for   <7.0% adults with diabetes   08/21/2018 03:40 AM 8.2 (H) 4.8 - 5.6 % Final    Comment:    (NOTE) Pre diabetes:          5.7%-6.4% Diabetes:              >6.4% Glycemic control for   <7.0% adults with diabetes    CBG: Recent Labs  Lab 09/17/18 0654 09/17/18 1218 09/17/18 2157 09/24/2018 0648 09/02/2018 1126  GLUCAP 126* 248* 199* 129* 237*    Admitting History of Present Illness.   See above. Review of Systems:   General: awake, alert and oriented Cardiac: denies chest pain or palpitations Pulmonary: + for dyspnea, improved after thoracentesis GI: negative for pain or nausea Musculoskeletal: + for leg swelling Neuro: - for lightheadedness. Past medical history  He,  has a past medical history of Coronary artery disease, Diabetes mellitus without  complication (HCC), and Hypertension.   Surgical History    Past Surgical History:  Procedure Laterality Date  . CARDIAC CATHETERIZATION    . CORONARY ANGIOGRAPHY N/A 08/19/2018   Procedure: CORONARY ANGIOGRAPHY (CATH  LAB);  Surgeon: Lyn Records, MD;  Location: [facility] INVASIVE CV LAB;  Service: Cardiovascular;  Laterality: N/A;  . EYE SURGERY    . ICD IMPLANT N/A 08/31/2018   Procedure: ICD IMPLANT;  Surgeon: Marinus Maw, MD;  Location: [facility] INVASIVE CV LAB;  Service: Cardiovascular;  Laterality: N/A;  . IR THORACENTESIS ASP PLEURAL SPACE W/IMG GUIDE  08/27/2018  . IR THORACENTESIS ASP PLEURAL SPACE W/IMG GUIDE  09/03/2018  . PERCUTANEOUS CORONARY STENT INTERVENTION (PCI-S)  04/11/2003   LAD  . RIGHT HEART CATH N/A 08/19/2018   Procedure: RIGHT HEART CATH;  Surgeon: Lyn Records, MD;  Location: [facility] INVASIVE CV LAB;  Service: Cardiovascular;  Laterality: N/A;  . RIGHT HEART CATH N/A 09/09/2018   Procedure: RIGHT HEART CATH;  Surgeon: Dolores Patty, MD;  Location: [facility] INVASIVE CV LAB;  Service: Cardiovascular;  Laterality: N/A;     Social History   Social History   Socioeconomic History  . Marital status: Married    Spouse name: Not on file  . Number of children: Not on file  . Years of education: Not on file  . Highest education level: Not on file  Occupational History  . Not on file  Social Needs  . Financial resource strain: Not on file  . Food insecurity:    Worry: Not on file    Inability: Not on file  . Transportation needs:    Medical: Not on file    Non-medical: Not on file  Tobacco Use  . Smoking status: Never Smoker  . Smokeless tobacco: Never Used  Substance and Sexual Activity  . Alcohol use: Yes    Comment: Occasionally.  . Drug use: Not on file  . Sexual activity: Not on file  Lifestyle  . Physical activity:    Days per week: Not on file    Minutes per session: Not on file  . Stress: Not on file  Relationships  . Social connections:    Talks on  phone: Not on file    Gets together: Not on file    Attends religious service: Not on file    Active member of club or organization: Not on file    Attends meetings of clubs or organizations: Not on file    Relationship status: Not on file  . Intimate partner violence:    Fear of current or ex partner: Not on file    Emotionally abused: Not on file    Physically abused: Not on file    Forced sexual activity: Not on file  Other Topics Concern  . Not on file  Social History Narrative  . Not on file  ,  reports that he has never smoked. He has never used smokeless tobacco. He reports that he drinks alcohol.   Family history   His family history includes Diabetes Mellitus II in his mother; Throat cancer in his brother.   Allergies No Known Allergies  Home meds  Prior to Admission medications   Medication Sig Start Date End Date Taking? Authorizing Provider  Apple Cid Vn-Grn Tea-Bit Or-Cr (APPLE CIDER VINEGAR PLUS) TABS Take 1 tablet by mouth daily.   Yes [provider]  aspirin 81 MG EC tablet Take 1 tablet (81 mg total) by mouth daily. 09/07/18  Yes Alford Highland, NP  atorvastatin (LIPITOR) 40 MG tablet Take 1 tablet (40 mg total) by mouth daily at 6 PM. 09/06/18  Yes Alford Highland, NP  carvedilol (COREG) 6.25 MG tablet Take 0.5  tablets (3.125 mg total) by mouth 2 (two) times daily with a meal. 09/06/18  Yes Alford HighlandSmith, Ashley M, NP  docusate sodium (COLACE) 100 MG capsule Take 1 capsule (100 mg total) by mouth 2 (two) times daily as needed for mild constipation. 09/06/18  Yes Alford HighlandSmith, Ashley M, NP  glimepiride (AMARYL) 4 MG tablet Take 4 mg by mouth 2 (two) times daily.   Yes [provider]  LORazepam (ATIVAN) 1 MG tablet Take 0.5-1 mg by mouth daily as needed for anxiety.   Yes [provider]  Multiple Vitamin (MULTIVITAMIN WITH MINERALS) TABS tablet Take 1 tablet by mouth daily. 09/07/18  Yes Alford HighlandSmith, Ashley M, NP  Omega 3 340 MG CPDR Take 1 capsule by mouth at  bedtime.   Yes [provider]  ondansetron (ZOFRAN-ODT) 4 MG disintegrating tablet Take 1 tablet (4 mg total) by mouth every 8 (eight) hours as needed for nausea or vomiting. 09/09/18  Yes Bensimhon, Bevelyn Bucklesaniel R, MD  polyethylene glycol (MIRALAX / GLYCOLAX) packet Take 17 g by mouth daily as needed for moderate constipation. 09/06/18  Yes Alford HighlandSmith, Ashley M, NP  potassium chloride SA (K-DUR,KLOR-CON) 20 MEQ tablet Take 3 tablets (60 mEq total) by mouth daily. 09/09/18  Yes Bensimhon, Bevelyn Bucklesaniel R, MD  sertraline (ZOLOFT) 50 MG tablet Take 25 mg by mouth daily. 07/13/18  Yes [provider]  torsemide (DEMADEX) 20 MG tablet Take 3 tablets (60 mg total) by mouth daily. 09/07/18  Yes Alford HighlandSmith, Ashley M, NP  traZODone (DESYREL) 50 MG tablet Take 50 mg by mouth at bedtime as needed for sleep.  08/13/18  Yes [provider]  warfarin (COUMADIN) 2 MG tablet Take 1 tablet (2 mg total) by mouth daily. 09/06/18 09/06/19 Yes Alford HighlandSmith, Ashley M, NP  feeding supplement, GLUCERNA SHAKE, (GLUCERNA SHAKE) LIQD Take 237 mLs by mouth 2 (two) times daily between meals. Patient not taking: Reported on 09/12/2018 09/06/18   Alford HighlandSmith, Ashley M, NP     CRITICAL CARE Performed by: Elayne SnareMichael B Shyasia Funches   Total critical care time: 30 minutes  Critical care time was exclusive of separately billable procedures and treating other patients.  Critical care was necessary to treat or prevent imminent or life-threatening deterioration. Patient requiring inotropic support for CHF.  Critical care was time spent personally by me on the following activities: development of treatment plan with patient and/or surrogate as well as nursing, discussions with consultants, evaluation of patient's response to treatment, examination of patient, obtaining history from patient or surrogate, ordering and performing treatments and interventions, ordering and review of laboratory studies, ordering and review of radiographic studies, pulse oximetry and  re-evaluation of patient's condition.

## 2018-09-28 NOTE — Progress Notes (Signed)
   08-02-18 2200  Clinical Encounter Type  Visited With Patient  Visit Type Initial  Referral From Nurse  Stress Factors  Patient Stress Factors None identified  Family Stress Factors None identified   I responded to a Code Blue. Chaplain offered [facility]. Chaplain available as needed.  Chaplain Orest DikesAbel Lesta Limbert

## 2018-09-28 DEATH — deceased

## 2018-10-20 ENCOUNTER — Encounter (HOSPITAL_COMMUNITY): Payer: BLUE CROSS/BLUE SHIELD | Admitting: Internal Medicine

## 2018-12-01 ENCOUNTER — Encounter: Payer: BLUE CROSS/BLUE SHIELD | Admitting: Internal Medicine

## 2019-08-29 IMAGING — CR DG CHEST 2V
3 series · 3 of 3 positions shown · non-contrast
Comparison: None.

CLINICAL DATA: Orthopnea.  Clinical concern for CHF.

EXAM:
CHEST - 2 VIEW

[chest lat (1 of 2)]
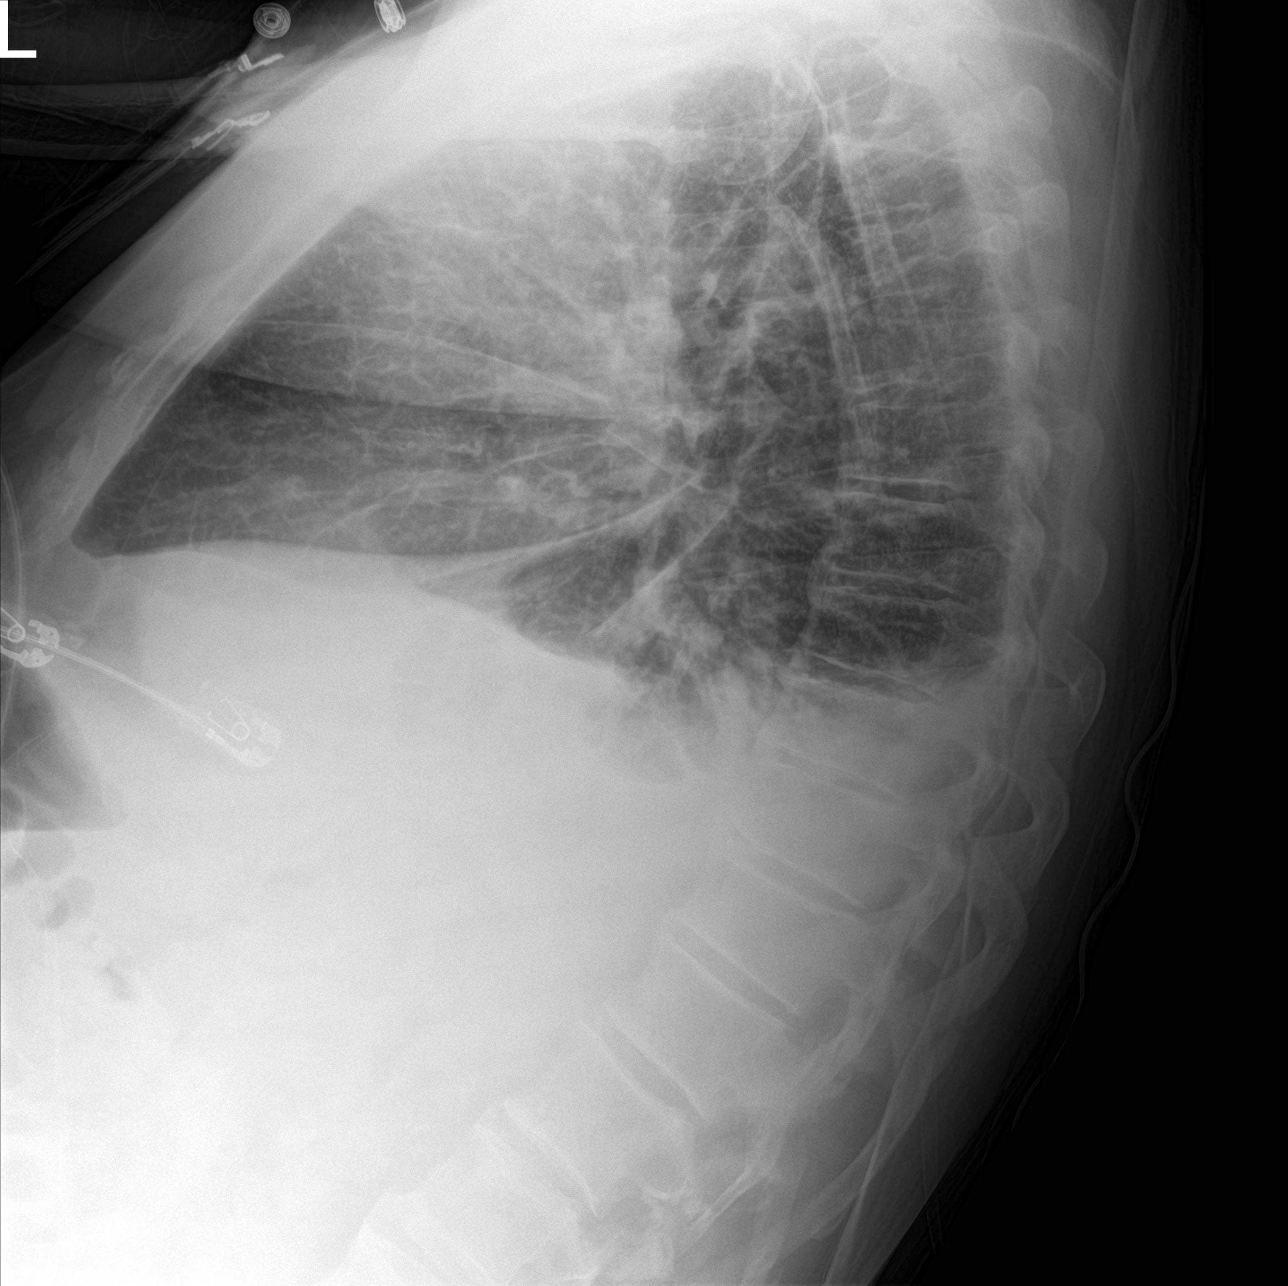

[chest ap]
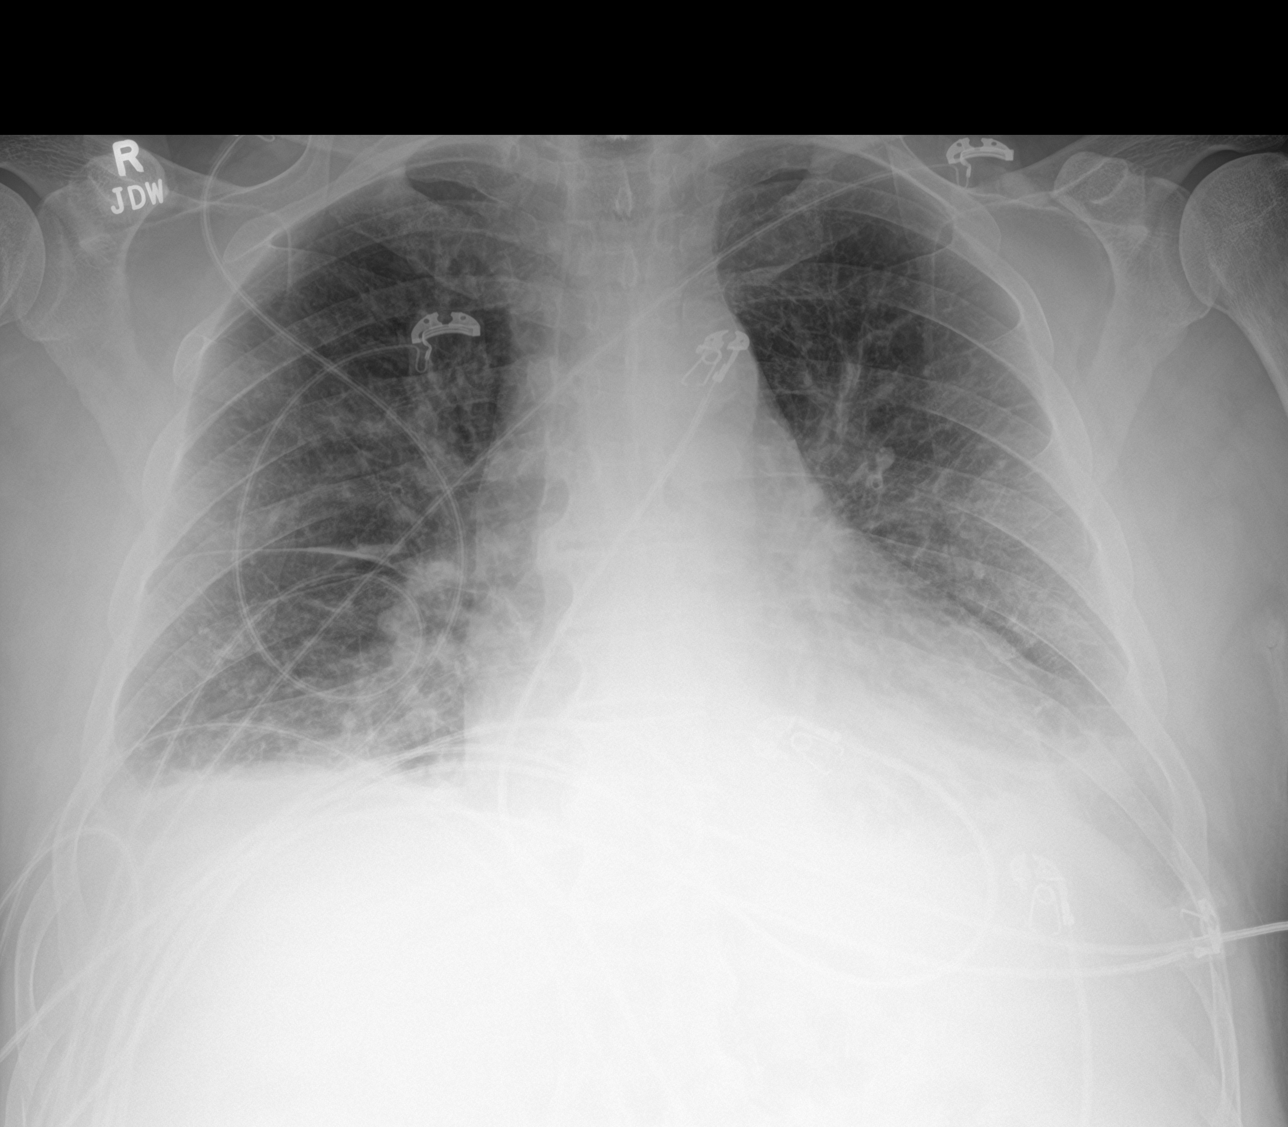

[chest lat (2 of 2)]
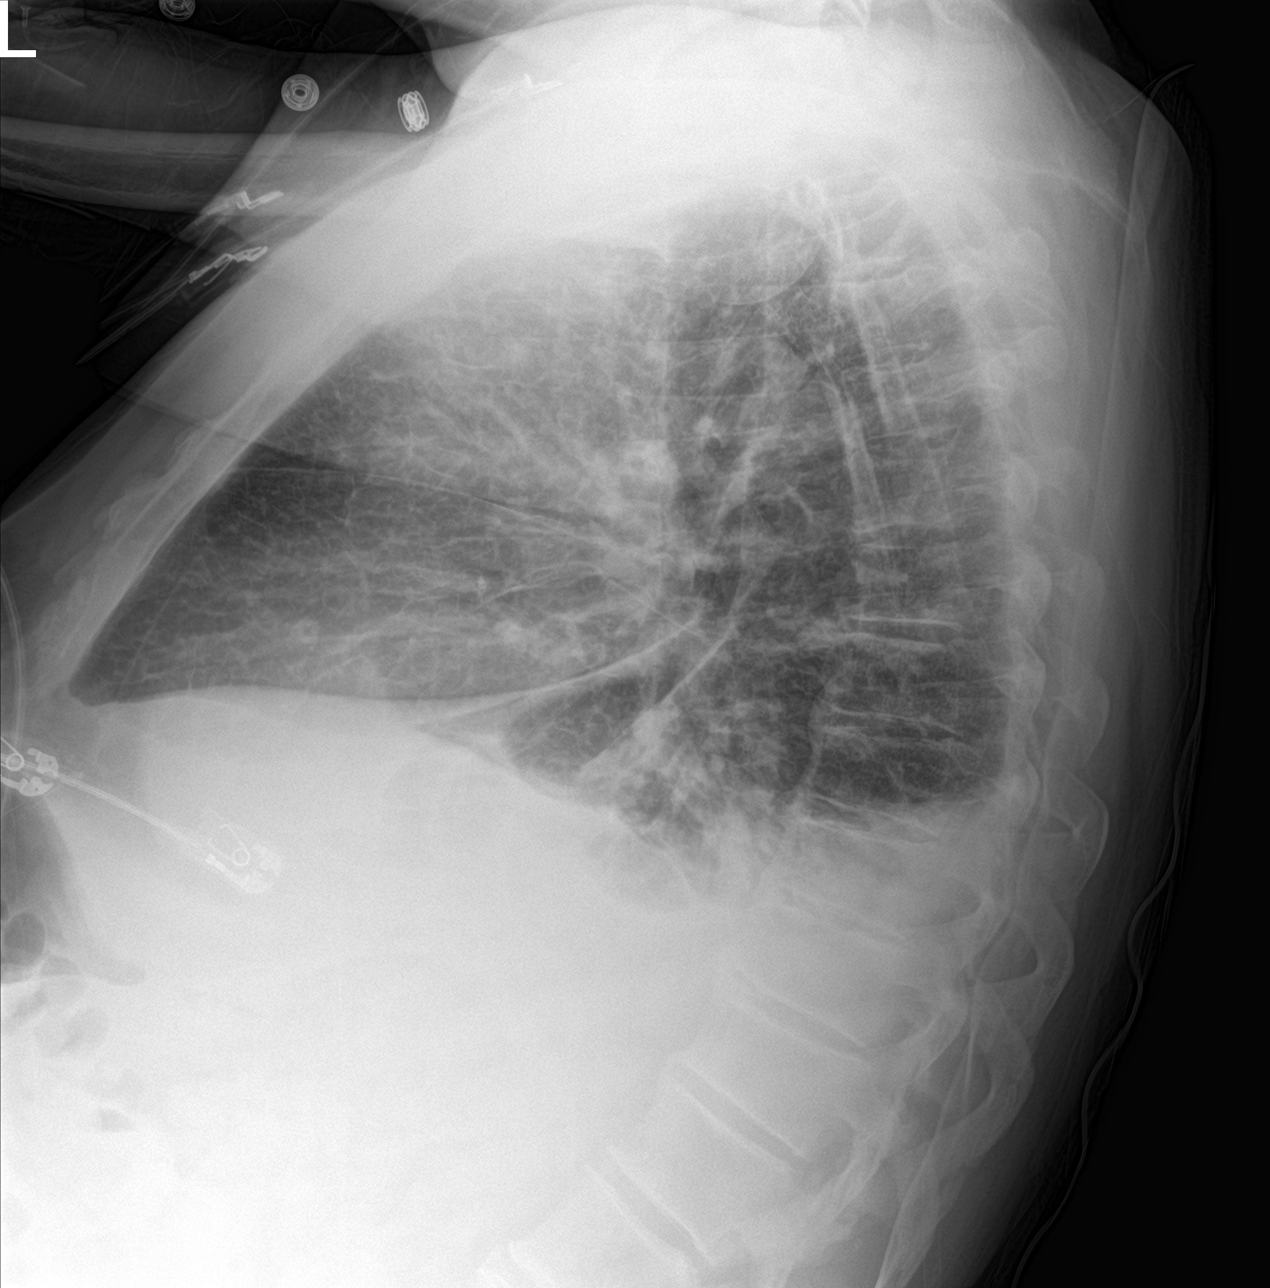

[3 of 3 positions shown; findings below may reference images not displayed]

FINDINGS: The cardiac silhouette is enlarged.

Mixed pattern pulmonary edema with bilateral moderate in size
pleural effusions.

Osseous structures are without acute abnormality. Soft tissues are
grossly normal.
IMPRESSION: Enlarged cardiac silhouette.

Mixed pattern pulmonary edema with bilateral moderate in size
pleural effusions.

## 2019-09-04 IMAGING — DX DG CHEST 1V PORT
1 series · 1 of 1 positions shown · non-contrast
Comparison: Radiographs August 20, 2018.

CLINICAL DATA: Cardiac arrest.

EXAM:
PORTABLE CHEST 1 VIEW

[chest]
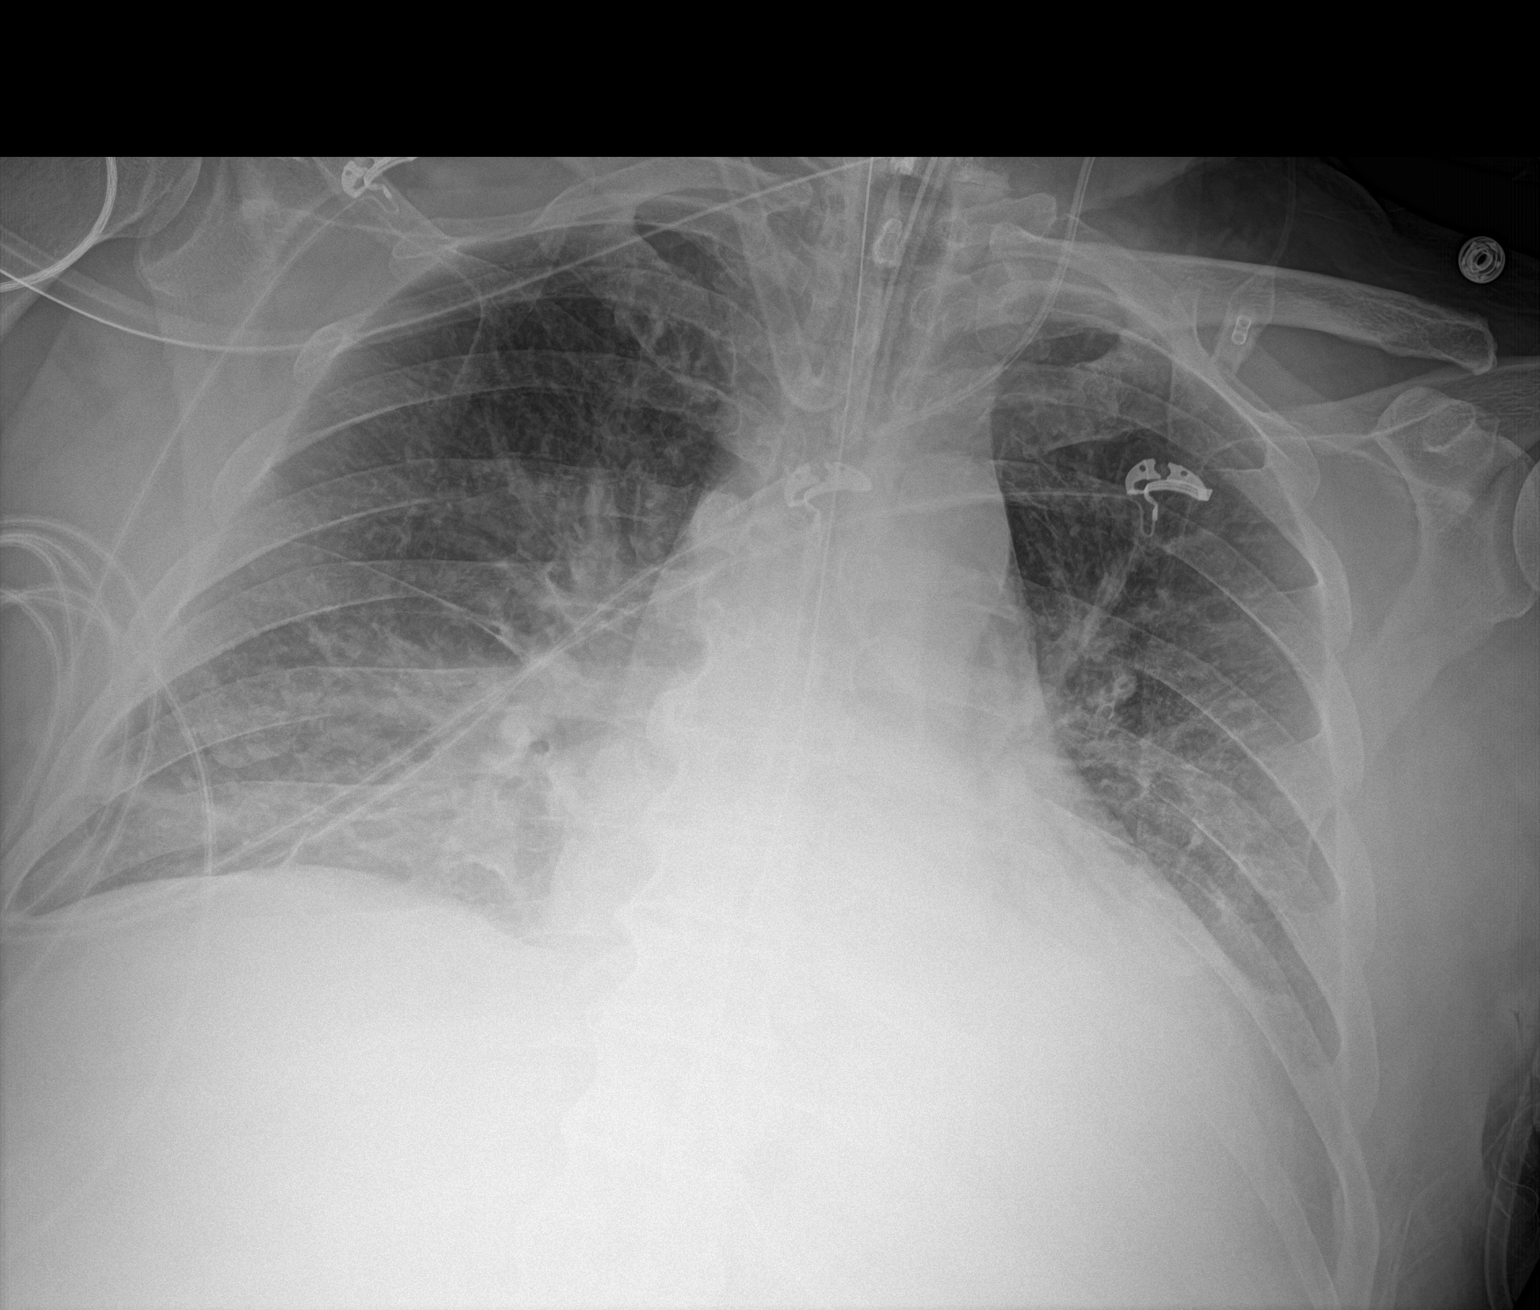

[1 of 1 positions shown; findings below may reference images not displayed]

FINDINGS: Stable cardiomegaly. Mild central pulmonary vascular congestion is
noted. Endotracheal and nasogastric tubes are unchanged in position.
Left internal jugular catheter is unchanged in position. No
pneumothorax is noted. Stable bibasilar subsegmental atelectasis or
edema is noted with probable small pleural effusions. Bony thorax is
unremarkable.
IMPRESSION: Stable cardiomegaly with central pulmonary vascular congestion.
Stable support apparatus. Stable bibasilar atelectasis or edema is
noted with probable small pleural effusions.

## 2019-09-17 IMAGING — US IR THORACENTESIS ASP PLEURAL SPACE W/IMG GUIDE
1 series · 3 of 3 positions shown · non-contrast
Comparison: Previous thoracentesis.

MEDICATIONS:
10 cc 2% lidocaine.

COMPLICATIONS:
None immediate.

INDICATION: Symptomatic right sided pleural effusion

EXAM:
IR THORACENTESIS ASP PLEURAL SPACE W/IMG GUIDE
TECHNIQUE: Informed written consent was obtained from the patient after a
discussion of the risks, benefits and alternatives to treatment. A
timeout was performed prior to the initiation of the procedure.

[Series 1: ir (id) (id)/(id)/(id) ir · 3 of 3 slices shown]
[im 1/3]
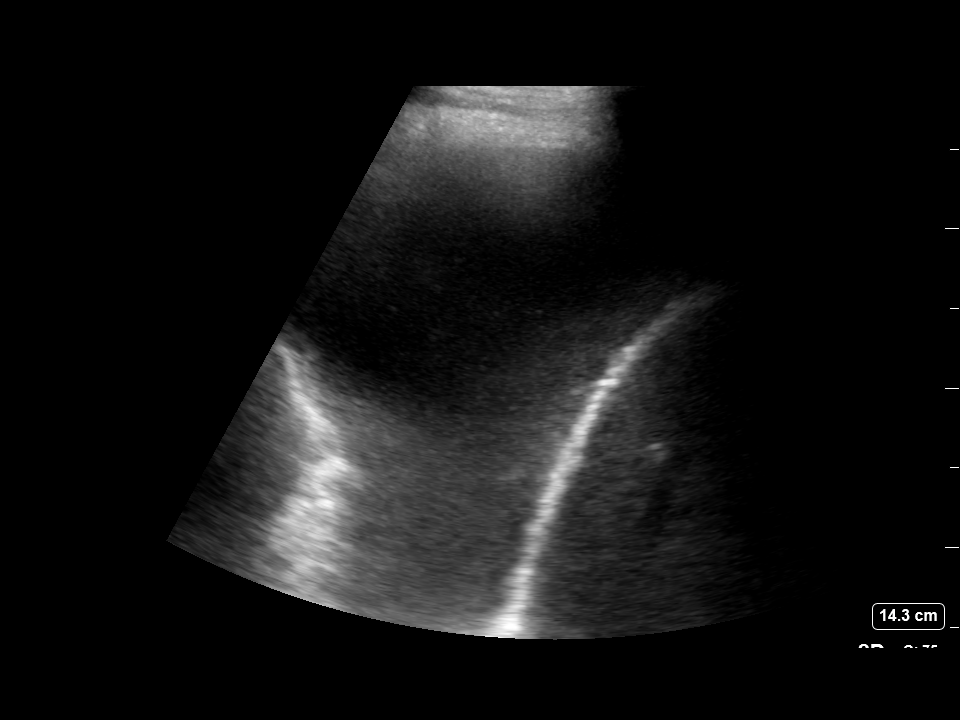
[im 2/3]
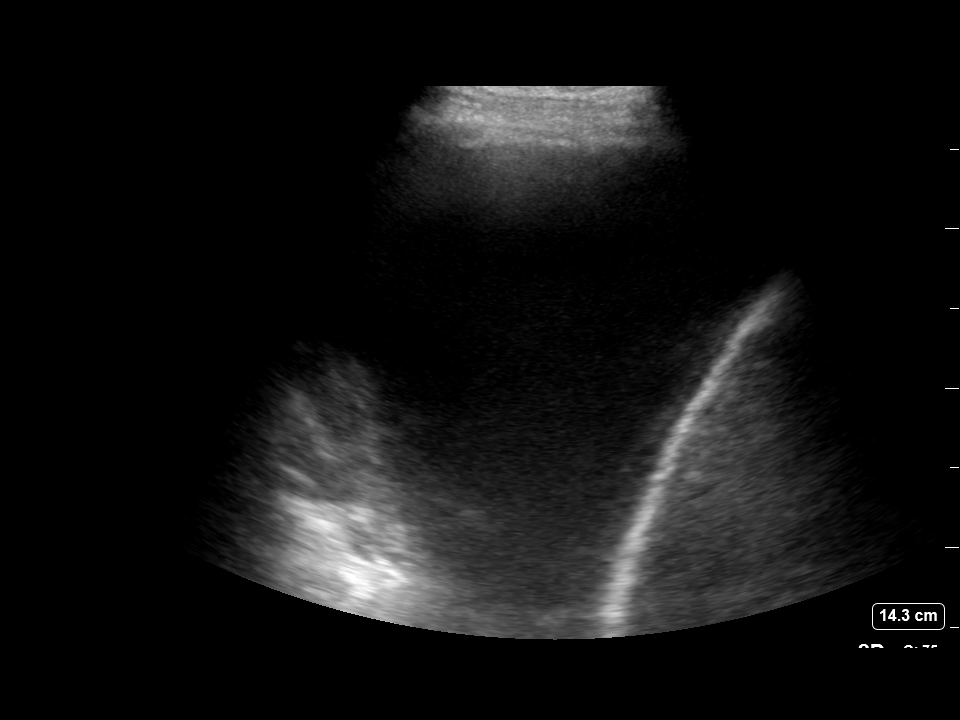
[im 3/3]
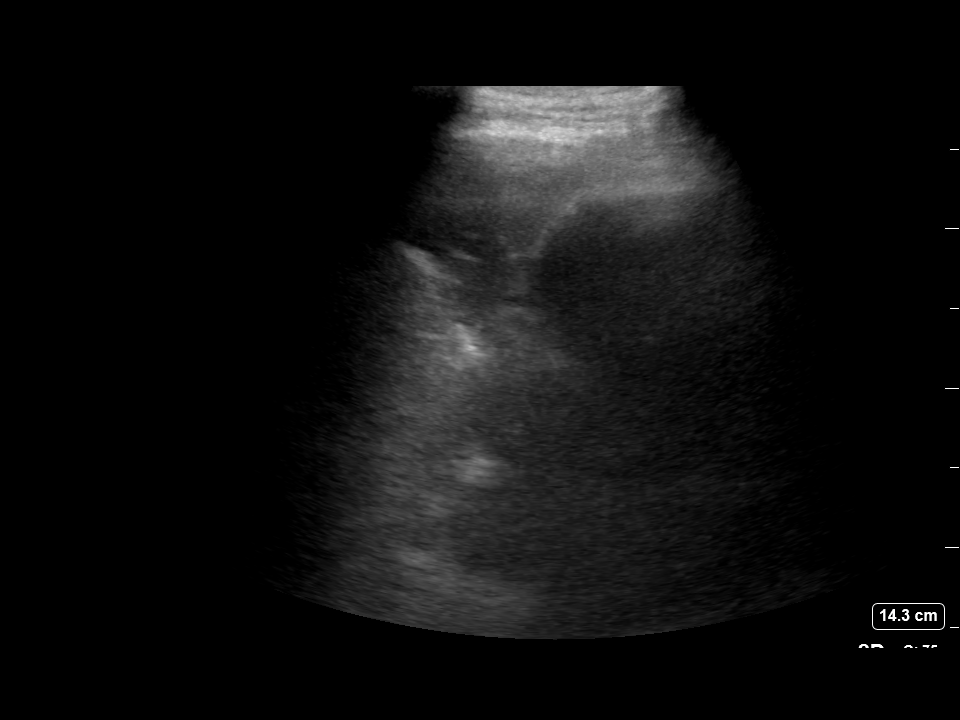

[3 of 3 positions shown; findings below may reference images not displayed]

Initial ultrasound scanning demonstrates a right pleural effusion.
The lower chest was prepped and draped in the usual sterile fashion.
1% lidocaine was used for local anesthesia.

Under direct ultrasound guidance, a 19 gauge, 7-cm, Yueh catheter
was introduced. An ultrasound image was saved for documentation
purposes. The thoracentesis was performed. The catheter was removed
and a dressing was applied. The patient tolerated the procedure well
without immediate post procedural complication. The patient was
escorted to have an upright chest radiograph.
FINDINGS: A total of approximately 1.7 liters of light amber fluid was
removed.
IMPRESSION: Successful ultrasound-guided right sided thoracentesis yielding
liters of pleural fluid.

Read by

Mota Gaming
